# Patient Record
Sex: Female | Born: 1950 | Race: White | Hispanic: No | Marital: Married | Smoking: Former smoker
Health system: Southern US, Community
[De-identification: ages and names within clinical notes are randomized; demographics above are authoritative.]

## PROBLEM LIST (undated history)

## (undated) DIAGNOSIS — F419 Anxiety disorder, unspecified: Secondary | ICD-10-CM

## (undated) DIAGNOSIS — K219 Gastro-esophageal reflux disease without esophagitis: Secondary | ICD-10-CM

## (undated) DIAGNOSIS — T7840XA Allergy, unspecified, initial encounter: Secondary | ICD-10-CM

## (undated) DIAGNOSIS — K635 Polyp of colon: Secondary | ICD-10-CM

## (undated) DIAGNOSIS — E559 Vitamin D deficiency, unspecified: Secondary | ICD-10-CM

## (undated) DIAGNOSIS — R011 Cardiac murmur, unspecified: Secondary | ICD-10-CM

## (undated) DIAGNOSIS — Z1509 Genetic susceptibility to other malignant neoplasm: Secondary | ICD-10-CM

## (undated) DIAGNOSIS — Z1501 Genetic susceptibility to malignant neoplasm of breast: Secondary | ICD-10-CM

## (undated) DIAGNOSIS — Z1502 Genetic susceptibility to malignant neoplasm of ovary: Secondary | ICD-10-CM

## (undated) DIAGNOSIS — E785 Hyperlipidemia, unspecified: Secondary | ICD-10-CM

## (undated) DIAGNOSIS — Z1589 Genetic susceptibility to other disease: Secondary | ICD-10-CM

## (undated) DIAGNOSIS — K56699 Other intestinal obstruction unspecified as to partial versus complete obstruction: Secondary | ICD-10-CM

## (undated) DIAGNOSIS — M1990 Unspecified osteoarthritis, unspecified site: Secondary | ICD-10-CM

## (undated) HISTORY — DX: Genetic susceptibility to other malignant neoplasm: Z15.09

## (undated) HISTORY — DX: Cardiac murmur, unspecified: R01.1

## (undated) HISTORY — DX: Unspecified osteoarthritis, unspecified site: M19.90

## (undated) HISTORY — DX: Genetic susceptibility to malignant neoplasm of breast: Z15.01

## (undated) HISTORY — PX: EYE SURGERY: SHX253

## (undated) HISTORY — PX: COLONOSCOPY: SHX174

## (undated) HISTORY — DX: Genetic susceptibility to malignant neoplasm of ovary: Z15.02

## (undated) HISTORY — DX: Gastro-esophageal reflux disease without esophagitis: K21.9

## (undated) HISTORY — PX: BREAST BIOPSY: SHX20

## (undated) HISTORY — DX: Polyp of colon: K63.5

## (undated) HISTORY — DX: Other intestinal obstruction unspecified as to partial versus complete obstruction: K56.699

## (undated) HISTORY — DX: Anxiety disorder, unspecified: F41.9

## (undated) HISTORY — DX: Allergy, unspecified, initial encounter: T78.40XA

## (undated) HISTORY — PX: BREAST CYST ASPIRATION: SHX578

## (undated) HISTORY — DX: Vitamin D deficiency, unspecified: E55.9

## (undated) HISTORY — DX: Hyperlipidemia, unspecified: E78.5

## (undated) HISTORY — DX: Genetic susceptibility to other disease: Z15.89

---
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
## 1980-05-11 HISTORY — PX: TUBAL LIGATION: SHX77

## 1981-05-11 HISTORY — PX: LIPOMA EXCISION: SHX5283

## 1998-11-08 ENCOUNTER — Other Ambulatory Visit: Admission: RE | Admit: 1998-11-08 | Discharge: 1998-11-08 | Payer: Self-pay | Admitting: *Deleted

## 2000-01-08 ENCOUNTER — Other Ambulatory Visit: Admission: RE | Admit: 2000-01-08 | Discharge: 2000-01-08 | Payer: Self-pay | Admitting: Obstetrics and Gynecology

## 2000-05-11 HISTORY — PX: BREAST SURGERY: SHX581

## 2000-05-14 ENCOUNTER — Encounter (INDEPENDENT_AMBULATORY_CARE_PROVIDER_SITE_OTHER): Payer: Self-pay | Admitting: Specialist

## 2000-05-14 ENCOUNTER — Encounter: Admission: RE | Admit: 2000-05-14 | Discharge: 2000-05-14 | Payer: Self-pay | Admitting: Obstetrics and Gynecology

## 2000-05-14 ENCOUNTER — Encounter: Payer: Self-pay | Admitting: Obstetrics and Gynecology

## 2000-05-14 ENCOUNTER — Other Ambulatory Visit: Admission: RE | Admit: 2000-05-14 | Discharge: 2000-05-14 | Payer: Self-pay | Admitting: Obstetrics and Gynecology

## 2000-07-22 ENCOUNTER — Encounter: Payer: Self-pay | Admitting: Obstetrics and Gynecology

## 2000-07-22 ENCOUNTER — Encounter: Admission: RE | Admit: 2000-07-22 | Discharge: 2000-07-22 | Payer: Self-pay | Admitting: Obstetrics and Gynecology

## 2000-11-26 ENCOUNTER — Emergency Department (HOSPITAL_COMMUNITY): Admission: EM | Admit: 2000-11-26 | Discharge: 2000-11-27 | Payer: Self-pay | Admitting: Emergency Medicine

## 2000-11-27 ENCOUNTER — Encounter: Payer: Self-pay | Admitting: Emergency Medicine

## 2000-12-09 HISTORY — PX: ABDOMINAL HYSTERECTOMY: SHX81

## 2000-12-14 ENCOUNTER — Encounter: Payer: Self-pay | Admitting: Obstetrics and Gynecology

## 2000-12-21 ENCOUNTER — Encounter (INDEPENDENT_AMBULATORY_CARE_PROVIDER_SITE_OTHER): Payer: Self-pay | Admitting: Specialist

## 2000-12-21 ENCOUNTER — Inpatient Hospital Stay (HOSPITAL_COMMUNITY): Admission: RE | Admit: 2000-12-21 | Discharge: 2000-12-23 | Payer: Self-pay | Admitting: Obstetrics and Gynecology

## 2001-06-06 ENCOUNTER — Encounter: Admission: RE | Admit: 2001-06-06 | Discharge: 2001-06-06 | Payer: Self-pay | Admitting: Obstetrics and Gynecology

## 2001-06-06 ENCOUNTER — Encounter: Payer: Self-pay | Admitting: Obstetrics and Gynecology

## 2002-01-05 ENCOUNTER — Encounter (INDEPENDENT_AMBULATORY_CARE_PROVIDER_SITE_OTHER): Payer: Self-pay | Admitting: Specialist

## 2002-01-05 ENCOUNTER — Ambulatory Visit (HOSPITAL_COMMUNITY): Admission: RE | Admit: 2002-01-05 | Discharge: 2002-01-05 | Payer: Self-pay | Admitting: *Deleted

## 2002-03-08 ENCOUNTER — Other Ambulatory Visit: Admission: RE | Admit: 2002-03-08 | Discharge: 2002-03-08 | Payer: Self-pay | Admitting: Obstetrics and Gynecology

## 2002-06-26 ENCOUNTER — Encounter: Payer: Self-pay | Admitting: Obstetrics and Gynecology

## 2002-06-26 ENCOUNTER — Encounter: Admission: RE | Admit: 2002-06-26 | Discharge: 2002-06-26 | Payer: Self-pay | Admitting: Obstetrics and Gynecology

## 2002-07-10 ENCOUNTER — Ambulatory Visit (HOSPITAL_COMMUNITY): Admission: RE | Admit: 2002-07-10 | Discharge: 2002-07-10 | Payer: Self-pay | Admitting: Obstetrics and Gynecology

## 2002-11-27 ENCOUNTER — Encounter: Payer: Self-pay | Admitting: Diagnostic Radiology

## 2002-11-27 ENCOUNTER — Encounter: Admission: RE | Admit: 2002-11-27 | Discharge: 2002-11-27 | Payer: Self-pay

## 2002-12-19 ENCOUNTER — Encounter: Admission: RE | Admit: 2002-12-19 | Discharge: 2002-12-19 | Payer: Self-pay

## 2003-02-05 ENCOUNTER — Encounter: Admission: RE | Admit: 2003-02-05 | Discharge: 2003-02-05 | Payer: Self-pay

## 2003-05-28 ENCOUNTER — Other Ambulatory Visit: Admission: RE | Admit: 2003-05-28 | Discharge: 2003-05-28 | Payer: Self-pay | Admitting: Obstetrics and Gynecology

## 2003-07-02 ENCOUNTER — Encounter: Admission: RE | Admit: 2003-07-02 | Discharge: 2003-07-02 | Payer: Self-pay | Admitting: Obstetrics and Gynecology

## 2004-07-07 ENCOUNTER — Encounter: Admission: RE | Admit: 2004-07-07 | Discharge: 2004-07-07 | Payer: Self-pay | Admitting: Obstetrics and Gynecology

## 2005-07-13 ENCOUNTER — Encounter: Admission: RE | Admit: 2005-07-13 | Discharge: 2005-07-13 | Payer: Self-pay | Admitting: Obstetrics and Gynecology

## 2005-07-20 ENCOUNTER — Encounter: Admission: RE | Admit: 2005-07-20 | Discharge: 2005-07-20 | Payer: Self-pay | Admitting: Internal Medicine

## 2006-06-15 ENCOUNTER — Encounter: Admission: RE | Admit: 2006-06-15 | Discharge: 2006-09-13 | Payer: Self-pay | Admitting: Certified Nurse Midwife

## 2006-07-15 ENCOUNTER — Encounter: Admission: RE | Admit: 2006-07-15 | Discharge: 2006-07-15 | Payer: Self-pay | Admitting: Obstetrics and Gynecology

## 2007-08-11 ENCOUNTER — Encounter: Admission: RE | Admit: 2007-08-11 | Discharge: 2007-08-11 | Payer: Self-pay | Admitting: Obstetrics and Gynecology

## 2008-06-28 ENCOUNTER — Ambulatory Visit: Payer: Self-pay | Admitting: Internal Medicine

## 2008-07-04 ENCOUNTER — Telehealth: Payer: Self-pay | Admitting: Internal Medicine

## 2008-07-19 ENCOUNTER — Encounter: Payer: Self-pay | Admitting: Internal Medicine

## 2008-07-19 ENCOUNTER — Ambulatory Visit: Payer: Self-pay | Admitting: Internal Medicine

## 2008-07-21 ENCOUNTER — Encounter: Payer: Self-pay | Admitting: Internal Medicine

## 2008-08-14 ENCOUNTER — Encounter: Admission: RE | Admit: 2008-08-14 | Discharge: 2008-08-14 | Payer: Self-pay | Admitting: Obstetrics and Gynecology

## 2008-08-16 ENCOUNTER — Encounter: Admission: RE | Admit: 2008-08-16 | Discharge: 2008-08-16 | Payer: Self-pay | Admitting: Obstetrics and Gynecology

## 2009-07-17 ENCOUNTER — Ambulatory Visit: Payer: Self-pay | Admitting: Internal Medicine

## 2009-08-06 LAB — HM COLONOSCOPY: HM Colonoscopy: NORMAL

## 2009-08-27 ENCOUNTER — Encounter: Admission: RE | Admit: 2009-08-27 | Discharge: 2009-08-27 | Payer: Self-pay | Admitting: Obstetrics and Gynecology

## 2010-03-24 ENCOUNTER — Encounter: Admission: RE | Admit: 2010-03-24 | Discharge: 2010-03-24 | Payer: Self-pay | Admitting: Obstetrics and Gynecology

## 2010-06-01 ENCOUNTER — Encounter: Payer: Self-pay | Admitting: Obstetrics and Gynecology

## 2010-06-23 ENCOUNTER — Other Ambulatory Visit: Payer: Self-pay | Admitting: Obstetrics and Gynecology

## 2010-06-23 DIAGNOSIS — Z1231 Encounter for screening mammogram for malignant neoplasm of breast: Secondary | ICD-10-CM

## 2010-08-29 ENCOUNTER — Ambulatory Visit
Admission: RE | Admit: 2010-08-29 | Discharge: 2010-08-29 | Disposition: A | Discharge status: Home / Self Care | Payer: Federal, State, Local not specified - PPO | Source: Ambulatory Visit | Attending: Obstetrics and Gynecology | Admitting: Obstetrics and Gynecology

## 2010-08-29 DIAGNOSIS — Z1231 Encounter for screening mammogram for malignant neoplasm of breast: Secondary | ICD-10-CM

## 2010-09-26 NOTE — H&P (Signed)
[facility]  Patient:    Rachael Sharp, Rachael Sharp            MRN: [id number] Adm. Date:  [id number] Attending:  Jenean Lindau                         History and Physical  IDENTIFYING DATA:  The patient is a 60 year old female with a large adnexal neoplasm admitted for TAH/BSO and staging as indicated.  HISTORY OF PRESENT ILLNESS:  The patient is a 60 year old gravida 0 female who initially presented for routine gynecologic care in August of 2001, with a normal examination.  She subsequently has some right lower quadrant pain and GI complaints and was seen in the emergency room and had evaluation including ultrasound and CT scan which showed a large adnexal neoplasm.  CT of the pelvis revealed a 12 x 7 cm posterior right ovarian mass.  Pelvic ultrasound confirmed a 10.7 x 10.5 x 8.1 cm mass on the posterior aspect of the right ovary which was complex in appearance.  She subsequently has had gradual resolution of her pain which was felt possibly due to early torsion versus a GI virus which subsequently resolved spontaneously.  She had a baseline CA-125 which was normal at 11.9.  Due to the large size of the mass, and the risk for malignancy she is to undergo and TAH/BSO and staging as indicated.  Dr. Ronita Hipps from the Department of GYN oncology at [facility] is on standby in the event that staging is indicated.  She is subsequently counselled as to the risks, benefits, alternatives and complications of the procedure and agrees to proceed.  She is agreeable to complete hysterectomy, even if the neoplasm is benign due to the risk of bilaterality.  Full consent has been given.  She is admitted now for same day surgery.  PAST HISTORY AND MEDICAL: 1. Gastroesophageal reflux disease. 2. Irritable bowel. 3. History of depression. 4. Mitral valve prolapse requiring prophylaxis, asymptomatic.  PAST SURGICAL HISTORY: 1. Tonsillectomy and adenoidectomy as a  child. 2. Vaginal tubal ligation in the distant past. 3. Stereotactic core biopsy of the right breast, January 2002, with sclerosis    adenosis.  ALLERGIES:  PENICILLIN causes hives.  CODEINE causes nausea and vomiting.  No other allergies or sensitivities including latex.  TRANSFUSION HISTORY:  Negative.  CURRENT MEDICATIONS: 1. Prilosec 20 mg q. day. 2. Vioxx 25 mg q. day. 3. Stopped birth control pills in January of 2002. 4. Prior Paxil use which was discontinued. 5. Prior celexa which was discontinued as well.  SOCIAL HISTORY:  The patient is married with no children.  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 She denies any smoking, alcohol or illicit drug use.  FAMILY HISTORY:  Positive for hypertension and heart disease in her father, otherwise no maternal history of ovarian, uterine, breast or colon carcinoma.  REVIEW OF SYSTEMS:  Notable for the history of present illness.  She does not some abdominal pressure and some dull achy pain in the right lower quadrant which is well controlled by the vioxx.  She has no other acute pain and had resolution of her GI symptoms.  She denies any fever or constitutional symptoms.  Cardiovascular and respiratory:  Negative with no symptoms related to palpitations.  Neurologic:  Nonfocal.  She describes herself as being very anxious.  PHYSICAL EXAMINATION:  Physical examination prior to admission:  GENERAL:  Revealed a petite white female in no  apparent distress.  Height 5 foot 5 inches.  Weight 130 pounds.  VITAL SIGNS:  Blood pressure 104/70, respirations 16, pulse 76, temperature 98.  HEENT:  Grossly negative.  Oropharynx clear.  NECK:  Supple without thyromegaly or lymphadenopathy.  CHEST:  Without spine or CVA tenderness.  LUNGS:  Clear to auscultation.  HEART:  regular rate and rhythm with a grade 2/6 systolic ejection murmur at the base.  No obvious click.  Carotids +2 and equal without bruit.   Distal pulses full.  BREAST:  Without masses.  ABDOMEN:  Without hepatosplenomegaly or tenderness.  A suprapubic mass was appreciated.  PELVIC:  Revealed a clean vagina and cervix.  Uterus mid plane to retroverted, displaced due to the mass with a large posterior multicystic mass involving the posterior pelvis which was poorly mobile.  RECTOVAGINAL:  Confirmatory.  EXTREMITIES:  Without edema.  NEUROLOGIC:  Grossly nonfocal.  LABORATORY DATA:  Laboratory studies on admission reveal a hemoglobin of 12.9, hematocrit 38.3, normal white count and platelets.  Comprehensive metabolic panel with a nonfasting glucose of 133, alk. phos. slightly low at 38, otherwise normal.  Urinalysis clear.  Coagulation studies within normal limits.  Chest x-ray without active disease.  EKG normal sinus rhythm.  IMPRESSION ON ADMISSION: 1. 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  Benign versus malignant. 2. History of depression/anxiety. 3. Mitral valve prolapse, asymptomatic, requiring prophylaxis. 4. Recent breast biopsy, benign. 5. History of gastroesophageal reflux disease.  PLAN:  The patient is admitted for same day surgery. She will undergo a TAH/BSO and staging as indicated.  Dr. Sudie Grumbling T. Kyla Balzarine, is on standby in the event that staging is necessary.  She has given full consent for any and all necessary procedures.  She understands she will be permanently and irreversibly sterilized and become immediately menopausal as a result.  She was given a Vivelle-Dot 0.075 mg which she placed on her buttocks this morning prior to coming to the hospital and her hormones will be titrated postoperatively as needed.  She has undergone a mechanical bowel prep and has received Ancef and gentamicin preoperatively and presents now for surgery. DD:  12/21/00 TD:  12/21/00 Job: [id number]

## 2010-09-26 NOTE — Op Note (Signed)
   TNAMEGHINA, BITTINGER              ACCOUNT NO.:  [id number]   MEDICAL RECORD NO.:  [id number]                   PATIENT TYPE:  AMB   LOCATION:  ENDO                                 FACILITY:  [facility]   PHYSICIAN:  Georgiana Spinner, M.D.                 DATE OF BIRTH:  11/02/1950   DATE OF PROCEDURE:  DATE OF DISCHARGE:                                 OPERATIVE REPORT   PROCEDURE:  Upper endoscopy.   INDICATIONS FOR PROCEDURE:  Gastroesophageal reflux disease.   ANESTHESIA:  Demerol 70, Versed 7 mg.   DESCRIPTION OF PROCEDURE:  With the patient mildly sedated in the left  lateral decubitus position, the Olympus videoscopic endoscope was inserted  in the mouth, passed under direct vision through the esophagus which  appeared normal. There was no evidence of Barrett's seen. We entered into  the stomach. The fundus, body, antrum, duodenal bulb and second portion of  the duodenum were visualized. From this point, the endoscope was slowly  withdrawn taking circumferential views of the entire duodenal mucosa until  the endoscope was pulled back into the stomach and placed in retroflexion to  view the stomach from below. The endoscope was then straightened and  withdrawn taking circumferential views of the remaining gastric and  esophageal mucosa stopping in the body and fundus of the stomach where there  were some changes of gastritis, photographed and biopsied. The patient's  vital signs and pulse oximeter remained stable. The patient tolerated the  procedure well without apparent complications.   FINDINGS:  Changes of gastritis biopsied, await biopsy report. The patient  will call me for results and followup with me as an outpatient. Proceed to  colonoscopy as planned.                                               Georgiana Spinner, M.D.    GMO/MEDQ  D:  01/05/2002  T:  01/06/2002  Job:  [id number]   cc:   Darius Bump, M.D.   Laqueta Linden, M.D.  [address],  [address]  [place]  [place] [address]  Fax: [phone]   [facility]. Danella Deis, M.D.

## 2010-09-26 NOTE — Op Note (Signed)
   Rachael Sharp, Rachael Sharp              ACCOUNT NO.:  [id number]   MEDICAL RECORD NO.:  [id number]                   PATIENT TYPE:  AMB   LOCATION:  ENDO                                 FACILITY:  [facility]   PHYSICIAN:  Georgiana Spinner, M.D.                 DATE OF BIRTH:  05/02/1951   DATE OF PROCEDURE:  DATE OF DISCHARGE:                                 OPERATIVE REPORT   PROCEDURE:  Colonoscopy.   INDICATIONS FOR PROCEDURE:  Colon cancer screening.   ANESTHESIA:  Demerol 30, Versed 3 mg additionally.   DESCRIPTION OF PROCEDURE:  With the patient mildly sedated in the left  lateral decubitus position, a rectal exam was performed which revealed trace  positive material.  Subsequently, the Olympus videoscopic colonoscope was  inserted in the rectum and passed under direct vision to the cecum  identified by the ileocecal valve and appendiceal orifice. The prep was  good. Visicol tablets were used.  From this point, the colonoscope was  slowly withdrawn taking circumferential views of the entire colonic mucosa  stopping only in the rectum which appeared normal on direct and showed  hemorrhoids on retroflexed view. The endoscope was straightened and  withdrawn. The patient's vital signs and pulse oximeter remained stable. The  patient tolerated the procedure well without apparent complications.   FINDINGS:  Internal hemorrhoids, otherwise, unremarkable examination.   PLAN:  See endoscopy note for further details.                                                 Georgiana Spinner, M.D.    GMO/MEDQ  D:  01/05/2002  T:  01/06/2002  Job:  [id number]   cc:   Darius Bump, M.D.   Laqueta Linden, M.D.  [address]  [place]  [place] [address]  Fax: [phone]   [facility]. Danella Deis, M.D.

## 2010-09-26 NOTE — Op Note (Signed)
[facility]  Patient:    Rachael Sharp, Rachael Sharp            MRN: [id number] Proc. Date: 12/21/00 Adm. Date:  [id number] Attending:  Jenean Lindau                           Operative Report  PREOPERATIVE DIAGNOSIS:  Adnexal neoplasm, normal CA-125.  POSTOPERATIVE DIAGNOSIS:  Benign adnexal neoplasm, right, paraovarian cyst versus hydrosalpinx per Dr. Guilford Shi.  PROCEDURE:  Total abdominal hysterectomy with bilateral salpingo-oophorectomy.  SURGEON:  Laqueta Linden, M.D.  ASSISTANT:  Edwena Felty. Ashley Royalty, M.D. standby John T. Kyla Balzarine, M.D.  ANESTHESIA:  General endotracheal.  ESTIMATED BLOOD LOSS:  100 cc.  URINE OUTPUT:  200 cc.  FLUIDS:  1400 cc of crystalloid.  COUNTS:  Correct x 2.  COMPLICATIONS:  None.  INDICATIONS:  Rachael Sharp is a 60 year old, nulligravida, perimenopausal female, who presented with a large cystic mass involving the right adnexa, measuring 10 x 10 x 7 cm.  She had a normal CA-125.  She is to undergo TAH/BSO with staging procedures as indicated.  She has been fully counseled as to the risks; benefits; alternatives, and complications of the procedure, including but not limited to, anesthesia risk; infection; bleeding requiring transfusion; injury to bowel, bladder, ureters, vessels, nerves; possible need for staging lymphadenectomy; omentectomy, etc.; postoperative menopausal symptoms with discussion of alternatives and estrogen replacement therapy and other alternatives as well as postoperative recovery expectations, regarding time in the hospital, recovery time after hospitalization, sexual functioning, and return to normal activity.  She has been extensively counseled as has her husband.  They have seen the informed consent film and given full consent, and she agrees now to proceed.  She elects to proceed with complete hysterectomy even if the cyst is of a benign nature.  Dr. Ronita Hipps of the department of  GYN/Oncology at [facility] is on surgical standby in the event that staging is indicated based on the frozen section.  DESCRIPTION OF PROCEDURE:  The patient was taken to the operating room and after proper identification and consents were ascertained, she was placed on the operating table in the supine position.  After the induction of general endotracheal anesthesia, she was placed in the Fairview stirrups, and the abdomen, perineum, and vagina were prepped and draped in a routine sterile fashion.  A transurethral Foley was placed.  A vertical incision was made just from just above the symphysis to just below the umbilicus and was carried down to the level of the anterior rectus fascia.  The fascia was incised, and the incision was extended superiorly and inferiorly.  The rectus muscles were then separated.  The parietal peritoneum was elevated and incised, and the incision was extended superiorly and inferiorly to the level of the bladder. Peritoneal washings were taken and placed in heparinized solution and sent to cytology.  Inspection of the upper abdomen revealed smooth peritoneal surfaces.  The omentum was free of lesions.  The renal contours appeared normal.  The liver edge and diaphragm were smooth and free of nodularity.  The gallbladder had no obvious stones.  The appendix was somewhat adherent to the posterior cecum but was of normal caliber but with no obvious sludge or appendicolith identified.  The self-retaining retractor was placed and the bowel packed in the upper abdomen using moistened lap packs.  The pelvis was inspected, and the uterus was noted to be of normal size and freely mobile.  The right tube was status post tubal ligation and appeared to have a distal hydrosalpinx.  The right ovary actually appeared small and menopausal in appearance.  Attached to the tube and ovary by a cystic-appearing stalk, was a large, mobile, cystic mass.  There were multiple filmy adhesions to  the cul-de-sac and the small bowel which were easily lysed.  It was unclear whether this was paraovarian cyst or a huge hydrosalpinx, although it had no obvious malignant characteristics.  This was elevated into the operative field. The round ligament on the right was suture ligated and opened with dissection carried forward and posteriorly in the broad ligament.  The infundibulopelvic ligament was isolated on the right and the pedicle clamped, cut, and suture ligated after locating the ureter well out of the operative field.  A proximal adnexal clamp was then placed at the uterine cornua, and the right tube and ovary with the mass were then removed and sent for frozen section.  This was evaluated by Dr. Ralph Leyden, who felt that this was a benign neoplasm, either paraovarian or hydrosalpinx.  He did not see any reason to perform frozen section, as this appeared completely benign and was a single cystic mass upon opening.  The patients husband was informed via telephone of the benign nature of the cyst, and Dr. Kyla Balzarine was notified that his standby services would not be required.  Attention was then turned to the left adnexa after elevation of the uterus out of the operative field.  It also was noted to be somewhat convoluted with a small hydrosalpinx with a normal-appearing ovary.  There were also filmy adhesions to the small bowel as well which were sharply lysed with easy mobilization of that adnexa.  The round ligament on the left was clamped, suture ligated, and cut with dissection carried forward and posteriorly in the broad ligament with isolation of that infundibulopelvic ligament.  The ligament was clamped, cut, and doubly ligated with 0 Vicryl after isolating the ureter distal out of the operative field.  The uterine vessels were then skeletonized bilaterally and curved Heaney clamps placed across the vessels at the level of the internal os.  Pedicles were cut and suture ligated  with 0 Vicryl.  Successive straight Heaney clamps were placed along the cardinal ligaments bilaterally with pedicles cut and suture ligated with 0 Vicryl.  The bladder was advanced off  of the lower uterine segment without difficulty.  Curved Heaney clamps were placed across the uterosacral ligaments and upper vaginal angles bilaterally with pedicles cut and Heaney-ligated, and tagged.  The cervix was then circumscribed with a Satinsky, and the uterus, left tube, and ovary were then removed and sent for final sectioning to pathology.  The angles were then closed using Richardson angled sutures of 0 Vicryl.  The remainder of the vaginal cuff was closed with interrupted figure-of-eight sutures of 0 Vicryl. Several small bleeding points on the cuff were then cauterized.  Copious lavage was accomplished.  All peritoneal surfaces and pedicles were hemostatic.  The uterosacral ligaments were then plicated posteriorly to prevent enterocele formation.  The lap packs and retractor was then removed. Needle, sponge, and instrument counts were correct prior to closure of the abdomen.  The incision was closed in a running Smead-Jones fashion from top to middle and bottom to middle with 0 Maxon.  The skin was closed with staples, and a pressure dressing was then applied.  The patient was extubated and stable and transferred to the recovery room.  Estimated  blood loss 100 cc. Urine output: 200 cc.  Fluids: 1400 cc of crystalloid.  Counts correct x 2 and complications none. DD:  12/21/00 TD:  12/21/00 Job: [id number]

## 2010-09-26 NOTE — Op Note (Signed)
NAME:  Rachael Sharp, Rachael Sharp NO.:  [id number]   MEDICAL RECORD NO.:  [id number]                   PATIENT TYPE:  AMB   LOCATION:  SDC                                  FACILITY:  [facility]   PHYSICIAN:  Malva Limes, M.D.                 DATE OF BIRTH:  [birth year]   DATE OF PROCEDURE:  07/10/2002  DATE OF DISCHARGE:                                 OPERATIVE REPORT   PREOPERATIVE DIAGNOSES:  Chronic left lower quadrant pain.   POSTOPERATIVE DIAGNOSES:  1. Chronic left lower quadrant pain.  2. Large and small bowel adhesions.   PROCEDURE:  Diagnostic laparoscopy with lysis of adhesions.   SURGEON:  Malva Limes, M.D.   ANESTHESIA:  General endotracheal.   ANTIBIOTICS:  Vancomycin.   ESTIMATED BLOOD LOSS:  Minimal.   SPECIMENS:  None.   COMPLICATIONS:  None.   FINDINGS:  The patient had an adhesion between a loop of small bowel and the  bladder.  She also had several small adhesions involving the cecum,  appendix, and right lower quadrant.  The sigmoid colon had a few adhesions  to the left pelvic side wall.  The ovaries and uterus were surgically  absent.  The gallbladder appeared to be normal.  Liver appeared to be  normal.  There were no other adhesions or evidence of endometriosis.   PROCEDURE:  The patient was taken to the operating room where a general  anesthetic was administered without complications.  She was then placed in  the dorsal lithotomy position and prepped with Hibiclens.  Her bladder was  drained with a red rubber catheter.  She was then draped in the usual  fashion for this procedure.  A vertical skin incision was made.  This was  carried down to the fascia.  The fascia was incised with the Mayo.  The  anterior leaf of the fascia was incised with the Mayo.  The rectus muscles  were dissected.  The posterior leaf was grasped and transected.  The  parietoperitoneum was entered bluntly.  0 Vicryl sutures were placed  laterally.  The  Hasson cannula was placed into the abdominal cavity and  attached to the suture.  The abdominal cavity was insufflated with 3 L of  carbon dioxide.  The scope was then placed and the pelvic and abdominal  anatomy examined with findings as noted above.  At this point a 5 mm port  was placed in the left lower quadrant under direct visualization.  The  scissors were used to transect the loop of small bowel from the bladder.  Once this was accomplished, careful examination of the serosa and bowel were  undertaken.  There was no evidence of any injury except for a few abrasions  of the serosal surface.  At this point the adhesions involving the sigmoid  colon were taken down sharply.  Following this the adhesions involving the  sigmoid colon were transected.  The appendix was easily visualized, however,  was somewhat adherent to the right pelvic side wall and this was not  dissected.  At this point the abdominopelvic contents were copiously  irrigated.  Hemostasis appeared to be adequate.  This concluded the  procedure.  The  instruments were removed, the pneumoperitoneum released.  The fascia was  closed with interrupted 0 Vicryl suture.  The skin was closed with  interrupted 4-0 Vicryl suture.  The patient was extubated and taken to  recovery room in stable condition.  Instrument, lap counts correct x2.                                               Malva Limes, M.D.    MA/MEDQ  D:  07/10/2002  T:  07/10/2002  Job:  [id number]

## 2010-10-05 ENCOUNTER — Emergency Department: Payer: Self-pay | Admitting: Emergency Medicine

## 2011-06-23 ENCOUNTER — Encounter: Payer: Self-pay | Admitting: Internal Medicine

## 2011-06-23 ENCOUNTER — Ambulatory Visit (INDEPENDENT_AMBULATORY_CARE_PROVIDER_SITE_OTHER): Payer: Federal, State, Local not specified - PPO | Admitting: Internal Medicine

## 2011-06-23 VITALS — BP 112/80 | HR 80 | Temp 98.5°F | Wt 151.0 lb

## 2011-06-23 DIAGNOSIS — N904 Leukoplakia of vulva: Secondary | ICD-10-CM

## 2011-06-23 DIAGNOSIS — N63 Unspecified lump in unspecified breast: Secondary | ICD-10-CM

## 2011-06-23 DIAGNOSIS — N6310 Unspecified lump in the right breast, unspecified quadrant: Secondary | ICD-10-CM

## 2011-06-23 NOTE — Progress Notes (Signed)
  Subjective:    Patient ID: Rachael Sharp, female    DOB: December 02, 1950, 61 y.o.   MRN: [id number]  HPI is a 61 year old white female  who presents with a nontender breast mass on the right with no recent history of trauma or mastitis. She has had some sinus congestion without fevers recently and wonders if she has an enlarged lymph node. She has had prior mammography which showed a cyst on the right.    Past Medical History  Diagnosis Date  . Heart murmur     mitral valve prolapse  . GERD (gastroesophageal reflux disease)    No current outpatient prescriptions on file prior to visit.     Review of Systems  Constitutional: Negative for fever, chills and unexpected weight change.  HENT: Negative for hearing loss, ear pain, nosebleeds, congestion, sore throat, facial swelling, rhinorrhea, sneezing, mouth sores, trouble swallowing, neck pain, neck stiffness, voice change, postnasal drip, sinus pressure, tinnitus and ear discharge.   Eyes: Negative for pain, discharge, redness and visual disturbance.  Respiratory: Negative for cough, chest tightness, shortness of breath, wheezing and stridor.   Cardiovascular: Negative for chest pain, palpitations and leg swelling.  Musculoskeletal: Negative for myalgias and arthralgias.  Skin: Negative for color change and rash.  Neurological: Negative for dizziness, weakness, light-headedness and headaches.  Hematological: Negative for adenopathy.   BP 112/80  Pulse 80  Temp(Src) 98.5 F (36.9 C) (Oral)  Wt 151 lb (68.493 kg)  SpO2 99%       Objective:   Physical Exam  Constitutional: She is oriented to person, place, and time. She appears well-developed and well-nourished.  HENT:  Mouth/Throat: Oropharynx is clear and moist.  Eyes: EOM are normal. Pupils are equal, round, and reactive to light. No scleral icterus.  Neck: Normal range of motion. Neck supple. No JVD present. No thyromegaly present.  Cardiovascular: Normal rate, regular  rhythm, normal heart sounds and intact distal pulses.   Pulmonary/Chest: Effort normal and breath sounds normal.    Abdominal: Soft. Bowel sounds are normal. She exhibits no mass. There is no tenderness.  Musculoskeletal: Normal range of motion. She exhibits no edema.  Lymphadenopathy:    She has no cervical adenopathy.  Neurological: She is alert and oriented to person, place, and time.  Skin: Skin is warm and dry.  Psychiatric: She has a normal mood and affect.       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  History of breast cyst noted.     Updated Medication List Outpatient Encounter Prescriptions as of 06/23/2011  Medication Sig Dispense Refill  . amitriptyline (ELAVIL) 10 MG tablet Take 3 by mouth daily at bedtime      . estradiol (VIVELLE-DOT) 0.05 MG/24HR Apply one patch two times a week.

## 2011-06-25 ENCOUNTER — Encounter: Payer: Self-pay | Admitting: Internal Medicine

## 2011-06-25 DIAGNOSIS — K219 Gastro-esophageal reflux disease without esophagitis: Secondary | ICD-10-CM | POA: Insufficient documentation

## 2011-06-25 DIAGNOSIS — N904 Leukoplakia of vulva: Secondary | ICD-10-CM | POA: Insufficient documentation

## 2011-06-25 DIAGNOSIS — R011 Cardiac murmur, unspecified: Secondary | ICD-10-CM | POA: Insufficient documentation

## 2011-06-25 DIAGNOSIS — N6310 Unspecified lump in the right breast, unspecified quadrant: Secondary | ICD-10-CM | POA: Insufficient documentation

## 2011-06-25 NOTE — Assessment & Plan Note (Signed)
Diagnostic mammogram and ultrasound ordered .  History of breast cyst noted.

## 2011-06-25 NOTE — Assessment & Plan Note (Signed)
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

## 2011-07-07 ENCOUNTER — Telehealth: Payer: Self-pay | Admitting: Internal Medicine

## 2011-07-07 DIAGNOSIS — M25552 Pain in left hip: Secondary | ICD-10-CM

## 2011-07-07 NOTE — Telephone Encounter (Signed)
The only issue I recall [place] talking about was her breast .  I will need more information for th referral (which hip,  How long has it been hurting) And has she had her breast imaging yet?

## 2011-07-07 NOTE — Telephone Encounter (Signed)
[phone] Pt would like to get a referral to ortho for hip pain she spoke about this with you on her last visit   She would like to go to [facility]

## 2011-07-08 NOTE — Telephone Encounter (Signed)
I recommend Dr. Darrelyn Hillock, [facility] Fast.  Referral in process

## 2011-07-08 NOTE — Telephone Encounter (Signed)
Patient notified

## 2011-07-08 NOTE — Telephone Encounter (Signed)
Patient stated it is her left hip, and it has been very uncomfortable since early December.  She stated she has been to the chiropractor but it has not helped, just keeps getting worse.  She wants to go to [place] but does not have a preference from there.  She is having her breast imaging done tomorrow.

## 2011-07-09 ENCOUNTER — Ambulatory Visit
Admission: RE | Admit: 2011-07-09 | Discharge: 2011-07-09 | Disposition: A | Discharge status: Home / Self Care | Payer: Federal, State, Local not specified - PPO | Source: Ambulatory Visit | Attending: Internal Medicine | Admitting: Internal Medicine

## 2011-07-09 DIAGNOSIS — N63 Unspecified lump in unspecified breast: Secondary | ICD-10-CM

## 2011-08-04 ENCOUNTER — Other Ambulatory Visit: Payer: Self-pay | Admitting: Obstetrics and Gynecology

## 2011-08-04 DIAGNOSIS — Z1231 Encounter for screening mammogram for malignant neoplasm of breast: Secondary | ICD-10-CM

## 2011-09-01 ENCOUNTER — Ambulatory Visit (INDEPENDENT_AMBULATORY_CARE_PROVIDER_SITE_OTHER): Payer: Federal, State, Local not specified - PPO | Admitting: Internal Medicine

## 2011-09-01 ENCOUNTER — Encounter: Payer: Self-pay | Admitting: Internal Medicine

## 2011-09-01 VITALS — BP 112/78 | HR 82 | Temp 98.7°F | Resp 16 | Ht 65.0 in | Wt 158.5 lb

## 2011-09-01 DIAGNOSIS — H6120 Impacted cerumen, unspecified ear: Secondary | ICD-10-CM | POA: Insufficient documentation

## 2011-09-01 DIAGNOSIS — J069 Acute upper respiratory infection, unspecified: Secondary | ICD-10-CM | POA: Insufficient documentation

## 2011-09-01 MED ORDER — DOCUSATE SODIUM 50 MG/5ML PO LIQD: ORAL | Status: DC

## 2011-09-01 NOTE — Progress Notes (Signed)
Patient ID: Rachael Sharp, female   DOB: 06/08/1950, 61 y.o.   MRN: [id number]    Patient Active Problem List  Diagnoses  . Heart murmur  . GERD (gastroesophageal reflux disease)  . Dystrophy, vulva  . Mass of breast, right  . Cerumen impaction  . Upper respiratory infection    Subjective:  CC:   Chief Complaint  Patient presents with  . Sinusitis  . Otalgia    right    HPI:   Rachael Sharp a 61 y.o. female who presents New right sided occipital pain aggravated by tilting head back,  intermittent , followed by intermittent right ear fullness and right sided congestion.  No rhinnorrhea or allergic conjunctivitis sympotms.   Some post nasal  drip.  No sick contacts .  No fevers , cough or purulent discharge.  Both TMs are occluded by ear wax,  No lymphadenopathy on exam.   Past Medical History  Diagnosis Date  . Heart murmur     mitral valve prolapse  . GERD (gastroesophageal reflux disease)     Past Surgical History  Procedure Date  . Abdominal hysterectomy   . Breast surgery 2002    biopsy x 2 , 1976  . Lipoma excision 1983         The following portions of the patient's history were reviewed and updated as appropriate: Allergies, current medications, and problem list.    Review of Systems:   12 Pt  review of systems was negative except those addressed in the HPI,     History   Social History  . Marital Status: Married    Spouse Name: N/A    Number of Children: N/A  . Years of Education: N/A   Occupational History  . Not on file.   Social History Main Topics  . Smoking status: Former Smoker    Types: Cigarettes    Quit date: 08/31/1989  . Smokeless tobacco: Never Used   Comment: Quit 1991  . Alcohol Use: Yes  . Drug Use: No  . Sexually Active: Not on file   Other Topics Concern  . Not on file   Social History Narrative  . No narrative on file    Objective:  BP 112/78  Pulse 82  Temp(Src) 98.7 F (37.1 C) (Oral)   Resp 16  Ht 5\' 5"  (1.651 m)  Wt 158 lb 8 oz (71.895 kg)  BMI 26.38 kg/m2  SpO2 99%  General appearance: alert, cooperative and appears stated age Ears: normal TM's and external ear canals both ears Throat: lips, mucosa, and tongue normal; teeth and gums normal Neck: no adenopathy, no carotid bruit, supple, symmetrical, trachea midline and thyroid not enlarged, symmetric, no tenderness/mass/nodules Back: symmetric, no curvature. ROM normal. No CVA tenderness. Lungs: clear to auscultation bilaterally Heart: regular rate and rhythm, S1, S2 normal, no murmur, click, rub or gallop Abdomen: soft, non-tender; bowel sounds normal; no masses,  no organomegaly Pulses: 2+ and symmetric Skin: Skin color, texture, turgor normal. No rashes or lesions Lymph nodes: Cervical, supraclavicular, and axillary nodes normal.  Assessment and Plan:  Upper respiratory infection Her exam is benign and likely a mild viral syndrome.  supportive care recommended.  Cerumen impaction Colace liquid drops prescribed,  Return for irrigation on Thursday    Updated Medication List Outpatient Encounter Prescriptions as of 09/01/2011  Medication Sig Dispense Refill  . amitriptyline (ELAVIL) 10 MG tablet Take 30 mg by mouth at bedtime.       Marland Kitchen estradiol (VIVELLE-DOT)  0.05 MG/24HR Apply one patch two times a week.      . docusate (COLACE) 50 MG/5ML liquid 2 or 3 drops in each ear daily to soften ear wax  10 mL  0     No orders of the defined types were placed in this encounter.    No Follow-up on file.

## 2011-09-01 NOTE — Assessment & Plan Note (Signed)
Her exam is benign and likely a mild viral syndrome.  supportive care recommended.

## 2011-09-01 NOTE — Assessment & Plan Note (Signed)
Colace liquid drops prescribed,  Return for irrigation on Thursday

## 2011-09-01 NOTE — Patient Instructions (Signed)
You have a viral  Syndrome .  The post nasal drip is causing your sore throat.  Lavage your sinuses twice daly with Simply saline nasal spray.  Use benadryl 25 mg every 8 hours to  manage the drainage and congestion.  Gargle with salt water often for the sore throat.  Ok to use Alleve and tylenol for the neck pain   IIf symptoms are no better  In 3 to 4 days OR  if you develop T > 100.4,  Green nasal discharge,  Or facial pain,  Call us back and I will call in an antibiotic.    I recommend using liquid colace daily to soften your ear wax. Return on Thursday afternoon for an irrigation.

## 2011-09-03 ENCOUNTER — Ambulatory Visit: Payer: Federal, State, Local not specified - PPO

## 2011-09-08 ENCOUNTER — Ambulatory Visit
Admission: RE | Admit: 2011-09-08 | Discharge: 2011-09-08 | Disposition: A | Discharge status: Home / Self Care | Payer: Federal, State, Local not specified - PPO | Source: Ambulatory Visit | Attending: Obstetrics and Gynecology | Admitting: Obstetrics and Gynecology

## 2011-09-08 DIAGNOSIS — Z1231 Encounter for screening mammogram for malignant neoplasm of breast: Secondary | ICD-10-CM

## 2011-09-09 ENCOUNTER — Encounter: Payer: Self-pay | Admitting: Internal Medicine

## 2012-02-12 ENCOUNTER — Telehealth: Payer: Self-pay | Admitting: Internal Medicine

## 2012-02-12 NOTE — Telephone Encounter (Signed)
If the schedulers can find a 30 minute slot , but if she is just worried about getting her mammogram ordered on time I can do that without seeing her.

## 2012-02-12 NOTE — Telephone Encounter (Signed)
Pt is way over due for CPE and is wondering if she could come in sooner than February to have that done. She does not need Pap.

## 2012-02-18 NOTE — Telephone Encounter (Signed)
Spoke with pt and set her up for a CPE before end of year.

## 2012-03-08 ENCOUNTER — Ambulatory Visit (INDEPENDENT_AMBULATORY_CARE_PROVIDER_SITE_OTHER): Payer: Federal, State, Local not specified - PPO | Admitting: Internal Medicine

## 2012-03-08 ENCOUNTER — Encounter: Payer: Self-pay | Admitting: Internal Medicine

## 2012-03-08 VITALS — BP 118/72 | HR 73 | Temp 97.9°F | Ht 65.5 in | Wt 154.5 lb

## 2012-03-08 DIAGNOSIS — R5383 Other fatigue: Secondary | ICD-10-CM

## 2012-03-08 DIAGNOSIS — Z0000 Encounter for general adult medical examination without abnormal findings: Secondary | ICD-10-CM

## 2012-03-08 DIAGNOSIS — M130 Polyarthritis, unspecified: Secondary | ICD-10-CM

## 2012-03-08 DIAGNOSIS — E569 Vitamin deficiency, unspecified: Secondary | ICD-10-CM

## 2012-03-08 DIAGNOSIS — F4321 Adjustment disorder with depressed mood: Secondary | ICD-10-CM

## 2012-03-08 DIAGNOSIS — Z23 Encounter for immunization: Secondary | ICD-10-CM

## 2012-03-08 DIAGNOSIS — Z13220 Encounter for screening for lipoid disorders: Secondary | ICD-10-CM

## 2012-03-08 MED ORDER — OMEPRAZOLE 40 MG PO CPDR: 40.0000 mg | DELAYED_RELEASE_CAPSULE | Freq: Every day | ORAL | Status: DC

## 2012-03-08 NOTE — Patient Instructions (Signed)
If you take alleve on a daily or even 3 to 4 times per week  Take an omeprazole with it .  Try taking 2 tylenol at bedtime for the hip

## 2012-03-08 NOTE — Progress Notes (Signed)
Patient ID: Rachael Sharp, female   DOB: 09-Jun-1950, 61 y.o.   MRN: [id number]  Subjective:     Rachael Sharp is a 61 y.o. female and is here for a comprehensive physical exam. The patient reports  that in the interim she has seen a [facility] for left hip pain,  Treated for bursitis with steroid dose pack,  Now using alleve prn . 2) Feels that her thyroid is not regulated, she has multiple low thyroid symptoms including feeling near decreased concentration, and  difficulty losing weight. She isrustrated because she has been unable to get to goall weight.  Despite a 1500 cal diet and exercising 5 to 6 days per week she has achieved a wt loss of only 8 lbs in 4 months    History   Social History  . Marital Status: Married    Spouse Name: N/A    Number of Children: N/A  . Years of Education: N/A   Occupational History  . Not on file.   Social History Main Topics  . Smoking status: Former Smoker    Types: Cigarettes    Quit date: 08/31/1989  . Smokeless tobacco: Never Used   Comment: Quit 1991  . Alcohol Use: Yes  . Drug Use: No  . Sexually Active: Not on file   Other Topics Concern  . Not on file   Social History Narrative  . No narrative on file   Health Maintenance  Topic Date Due  . Pap Smear  01/26/1969  . Tetanus/tdap  01/26/1970  . Influenza Vaccine  01/09/2013  . Mammogram  09/07/2013  . Colonoscopy  08/07/2019  . Zostavax  Completed    The following portions of the patient's history were reviewed and updated as appropriate: allergies, current medications, past family history, past medical history, past social history, past surgical history and problem list.  Review of Systems A comprehensive review of systems was negative except for: Constitutional: positive for fatigue   Objective:   General appearance: alert, cooperative and appears stated age Ears: normal TM's and external ear canals both ears Throat: lips, mucosa, and tongue normal; teeth and  gums normal Scalp: normal distribution of hair, some thinning noted  Neck: no adenopathy, no carotid bruit, supple, symmetrical, trachea midline and thyroid not enlarged, symmetric, no tenderness/mass/nodules Back: symmetric, no curvature. ROM normal. No CVA tenderness. Breasts: breasts appear normal, no suspicious masses, no skin or nipple changes or axillary nodes.. Lungs: clear to auscultation bilaterally Heart: regular rate and rhythm, S1, S2 normal, no murmur, click, rub or gallop Abdomen: soft, non-tender; bowel sounds normal; no masses,  no organomegaly Pulses: 2+ and symmetric Skin: Skin color, texture, turgor normal. No rashes or lesions Lymph nodes: Cervical, supraclavicular, and axillary nodes normal Neuro: grossly nonfocal,  Normal DTRs      Assessment:     Routine general medical examination at a health care facility Annual exam without pelvic was done today. She will return for fasting labs and thyroid evaluation.  Fatigue Accompanied by multiple low thyroid symptoms including hair loss myalgias, decreased concentration and an inability to lose weight despite aggressive exercise and diet. We discussed resuming her thyroid function and if normal, will initiate trial of Wellbutrin.    Updated Medication List Outpatient Encounter Prescriptions as of 03/08/2012  Medication Sig Dispense Refill  . amitriptyline (ELAVIL) 10 MG tablet Take 30 mg by mouth at bedtime.       Marland Kitchen estradiol (VIVELLE-DOT) 0.05 MG/24HR Apply one patch two times a  week. Dosage 0.0375      . docusate (COLACE) 50 MG/5ML liquid 2 or 3 drops in each ear daily to soften ear wax  10 mL  0  . omeprazole (PRILOSEC) 40 MG capsule Take 1 capsule (40 mg total) by mouth daily.  30 capsule  3

## 2012-03-09 ENCOUNTER — Other Ambulatory Visit (INDEPENDENT_AMBULATORY_CARE_PROVIDER_SITE_OTHER): Payer: Federal, State, Local not specified - PPO

## 2012-03-09 ENCOUNTER — Encounter: Payer: Self-pay | Admitting: Internal Medicine

## 2012-03-09 DIAGNOSIS — M130 Polyarthritis, unspecified: Secondary | ICD-10-CM

## 2012-03-09 DIAGNOSIS — E569 Vitamin deficiency, unspecified: Secondary | ICD-10-CM

## 2012-03-09 DIAGNOSIS — R5383 Other fatigue: Secondary | ICD-10-CM | POA: Insufficient documentation

## 2012-03-09 DIAGNOSIS — Z13220 Encounter for screening for lipoid disorders: Secondary | ICD-10-CM

## 2012-03-09 DIAGNOSIS — R5381 Other malaise: Secondary | ICD-10-CM

## 2012-03-09 LAB — CBC WITH DIFFERENTIAL/PLATELET
Eosinophils Relative: 0.8 % (ref 0.0–5.0)
Monocytes Relative: 7.9 % (ref 3.0–12.0)
Neutrophils Relative %: 44.6 % (ref 43.0–77.0)
Platelets: 279 10*3/uL (ref 150.0–400.0)
RBC: 4.86 Mil/uL (ref 3.87–5.11)
WBC: 4.9 10*3/uL (ref 4.5–10.5)

## 2012-03-09 LAB — COMPREHENSIVE METABOLIC PANEL
ALT: 36 U/L — ABNORMAL HIGH (ref 0–35)
AST: 29 U/L (ref 0–37)
CO2: 31 mEq/L (ref 19–32)
Calcium: 9.8 mg/dL (ref 8.4–10.5)
Chloride: 105 mEq/L (ref 96–112)
Creatinine, Ser: 1.1 mg/dL (ref 0.4–1.2)
GFR: 53.65 mL/min — ABNORMAL LOW (ref >60.00)
Sodium: 142 mEq/L (ref 135–145)
Total Protein: 7.4 g/dL (ref 6.0–8.3)

## 2012-03-09 LAB — LIPID PANEL
Cholesterol: 230 mg/dL — ABNORMAL HIGH (ref 0–200)
Total CHOL/HDL Ratio: 3
VLDL: 23.6 mg/dL (ref 0.0–40.0)

## 2012-03-09 LAB — C-REACTIVE PROTEIN: CRP: <0.5 mg/dL (ref 0.5–20.0)

## 2012-03-09 LAB — LDL CHOLESTEROL, DIRECT: Direct LDL: 138.1 mg/dL

## 2012-03-09 LAB — TSH: TSH: 0.67 u[IU]/mL (ref 0.35–5.50)

## 2012-03-09 NOTE — Assessment & Plan Note (Signed)
Annual exam without pelvic was done today. She will return for fasting labs and thyroid evaluation.

## 2012-03-09 NOTE — Assessment & Plan Note (Signed)
Accompanied by multiple low thyroid symptoms including hair loss myalgias, decreased concentration and an inability to lose weight despite aggressive exercise and diet. We discussed resuming her thyroid function and if normal, will initiate trial of Wellbutrin.

## 2012-03-18 ENCOUNTER — Telehealth: Payer: Self-pay | Admitting: Internal Medicine

## 2012-03-18 DIAGNOSIS — Z7189 Other specified counseling: Secondary | ICD-10-CM

## 2012-03-18 NOTE — Telephone Encounter (Signed)
Message sent to [facility] to schedule appt.

## 2012-03-18 NOTE — Telephone Encounter (Signed)
Pt was referred to the hospice crisis counseling in [place] but knew some of the people there and she would prefer to speak with people who do not know her family she would like to go to the one in [place] if possible.

## 2012-03-18 NOTE — Telephone Encounter (Signed)
Referral in epic

## 2012-04-13 ENCOUNTER — Telehealth: Payer: Self-pay

## 2012-04-13 MED ORDER — BUPROPION HCL 75 MG PO TABS: 75.0000 mg | ORAL_TABLET | Freq: Two times a day (BID) | ORAL | Status: DC

## 2012-04-13 NOTE — Telephone Encounter (Signed)
Patient was seen in office last month and discussed Welbutrin, she stated that she would like a rx sent [facility]. She stated that if she needs to come back in for a visit she will do it.

## 2012-04-13 NOTE — Telephone Encounter (Signed)
No she does not have to return for an OV before starting it but I would like to follow up in one month to see how it is working.  I will send the lowest dose for starting,  75 mg twice daily.  Take second dose by 2 PM to avoid affecting her sleep,  rx sent to pharmacy

## 2012-04-14 NOTE — Telephone Encounter (Signed)
Patient notified as instructed. 

## 2012-05-17 ENCOUNTER — Encounter: Payer: Self-pay | Admitting: Internal Medicine

## 2012-05-17 ENCOUNTER — Ambulatory Visit (INDEPENDENT_AMBULATORY_CARE_PROVIDER_SITE_OTHER): Payer: Federal, State, Local not specified - PPO | Admitting: Internal Medicine

## 2012-05-17 VITALS — BP 120/80 | HR 90 | Temp 97.9°F | Resp 16 | Wt 159.8 lb

## 2012-05-17 DIAGNOSIS — F329 Major depressive disorder, single episode, unspecified: Secondary | ICD-10-CM

## 2012-05-17 DIAGNOSIS — J0100 Acute maxillary sinusitis, unspecified: Secondary | ICD-10-CM | POA: Insufficient documentation

## 2012-05-17 DIAGNOSIS — F4329 Adjustment disorder with other symptoms: Secondary | ICD-10-CM | POA: Insufficient documentation

## 2012-05-17 MED ORDER — BUPROPION HCL 100 MG PO TABS: 100.0000 mg | ORAL_TABLET | Freq: Two times a day (BID) | ORAL | Status: DC

## 2012-05-17 MED ORDER — LEVOFLOXACIN 500 MG PO TABS: 500.0000 mg | ORAL_TABLET | Freq: Every day | ORAL | Status: DC

## 2012-05-17 NOTE — Progress Notes (Signed)
Patient ID: Rachael Sharp, female   DOB: 08/15/1950, 62 y.o.   MRN: [id number]    Patient Active Problem List  Diagnosis  . Heart murmur  . GERD (gastroesophageal reflux disease)  . Dystrophy, vulva  . Mass of breast, right  . Routine general medical examination at a health care facility  . Fatigue  . Prolonged grief reaction  . Sinusitis, acute maxillary    Subjective:  CC:   Chief Complaint  Patient presents with  . Follow-up    Welbutrin    HPI:   Rachael Sharp a 62 y.o. female who presents for follow up on wellbutrin started one month ago for complicated for grief reaction following the death of her sister.  The wellbutrin has helped but she has only recently started to have some relief,  after 3 to 4 weeks of medication initiation.  Still cries easily but all in all is feeling better.  Sleep not affected  bc she takes amitryptiline at beditme.    2) post nasal drip with left sided tooth pain ear pain and throat pain for the past 3 weeks.  No fevers, purulent drainage .  Past Medical History  Diagnosis Date  . Heart murmur     mitral valve prolapse  . GERD (gastroesophageal reflux disease)     Past Surgical History  Procedure Date  . Abdominal hysterectomy   . Breast surgery 2002    biopsy x 2 , 1976  . Lipoma excision 1983         The following portions of the patient's history were reviewed and updated as appropriate: Allergies, current medications, and problem list.    Review of Systems:   Patient denies fevers, unintentional weight loss, skin rash, eye pain, dysphagia,  hemoptysis , cough, dyspnea, wheezing, chest pain, palpitations, orthopnea, edema, abdominal pain, nausea, melena, diarrhea, constipation, flank pain, dysuria, hematuria, urinary  Frequency, nocturia, numbness, tingling, seizures,  Focal weakness, Loss of consciousness,  Tremor, insomnia, depression, anxiety, and suicidal ideation.       History   Social History  .  Marital Status: Married    Spouse Name: N/A    Number of Children: N/A  . Years of Education: N/A   Occupational History  . Not on file.   Social History Main Topics  . Smoking status: Former Smoker    Types: Cigarettes    Quit date: 08/31/1989  . Smokeless tobacco: Never Used     Comment: Quit 1991  . Alcohol Use: Yes  . Drug Use: No  . Sexually Active: Not on file   Other Topics Concern  . Not on file   Social History Narrative  . No narrative on file    Objective:  BP 120/80  Pulse 90  Temp 97.9 F (36.6 C) (Oral)  Resp 16  Wt 159 lb 12 oz (72.462 kg)  SpO2 98%  General appearance: alert, cooperative and appears stated age Ears: Left TM occluded by cerumen. normal TM's and external ear canals both ears Throat: lips, mucosa, and tongue normal; teeth and gums normal Sinuses: Left maxillary tenderness to palpation. Right maxillary nontender. Neck: no adenopathy, no carotid bruit, supple, symmetrical, trachea midline and thyroid not enlarged, symmetric, no tenderness/mass/nodules Back: symmetric, no curvature. ROM normal. No CVA tenderness. Lungs: clear to auscultation bilaterally Heart: regular rate and rhythm, S1, S2 normal, no murmur, click, rub or gallop Abdomen: soft, non-tender; bowel sounds normal; no masses,  no organomegaly Pulses: 2+ and symmetric Skin: Skin color, texture,  turgor normal. No rashes or lesions Lymph nodes: Tender enlargement of the left anterior cervical nodes.  Assessment and Plan:  Prolonged grief reaction She has some response to low-dose Wellbutrin. She has begun exercising again and is not crying as easily as before and not daily. She has no untoward side effects. We discussed increasing her dose to 100 mg twice daily.  Sinusitis, acute maxillary Left-sided, with tooth pain lymphadenopathy and ear pain. Empiric antibiotics for treatment of sinusitis/otitis following an upper respiratory infection.   Updated Medication  List Outpatient Encounter Prescriptions as of 05/17/2012  Medication Sig Dispense Refill  . amitriptyline (ELAVIL) 10 MG tablet Take 30 mg by mouth at bedtime.       Marland Kitchen estradiol (VIVELLE-DOT) 0.05 MG/24HR Apply one patch two times a week. Dosage 0.0375      . [DISCONTINUED] buPROPion (WELLBUTRIN) 75 MG tablet Take 1 tablet (75 mg total) by mouth 2 (two) times daily.  60 tablet  1  . buPROPion (WELLBUTRIN) 100 MG tablet Take 1 tablet (100 mg total) by mouth 2 (two) times daily.  60 tablet  2  . docusate (COLACE) 50 MG/5ML liquid 2 or 3 drops in each ear daily to soften ear wax  10 mL  0  . levofloxacin (LEVAQUIN) 500 MG tablet Take 1 tablet (500 mg total) by mouth daily.  7 tablet  0  . omeprazole (PRILOSEC) 40 MG capsule Take 1 capsule (40 mg total) by mouth daily.  30 capsule  3     No orders of the defined types were placed in this encounter.    No Follow-up on file.

## 2012-05-17 NOTE — Assessment & Plan Note (Signed)
Left-sided, with tooth pain lymphadenopathy and ear pain. Empiric antibiotics for treatment of sinusitis/otitis following an upper respiratory infection.

## 2012-05-17 NOTE — Patient Instructions (Addendum)
Increase your wellbutrin to 1.5 tablets twice daily until current bottle is done.  Then refill at 100 mg twice daily or call for alternative  dose.   -----------------------------------------------------------------------------------------------------------------------------  You have a sinus/ear infection   .  I am prescribing an antibiotic (levaquin)  To manage the infection and the inflammation in your ear/sinuses.   I also advise use of the following OTC meds to help with your other symptoms.   Take generic OTC benadryl 25 mg every 8 hours for the drainage,  Sudafed PE  10 mg every 8 hours for the congestion and ear pain ,  flushes your sinuses twice daily with Simply Saline (do over the sink because if you do it right you will spit out globs of mucus)   Gargle with salt water or alkalol as needed for sore throat.

## 2012-05-17 NOTE — Assessment & Plan Note (Signed)
She has some response to low-dose Wellbutrin. She has begun exercising again and is not crying as easily as before and not daily. She has no untoward side effects. We discussed increasing her dose to 100 mg twice daily.

## 2012-05-23 ENCOUNTER — Encounter: Payer: Self-pay | Admitting: Internal Medicine

## 2012-08-02 ENCOUNTER — Other Ambulatory Visit: Payer: Self-pay

## 2012-08-02 DIAGNOSIS — Z1231 Encounter for screening mammogram for malignant neoplasm of breast: Secondary | ICD-10-CM

## 2012-09-08 ENCOUNTER — Ambulatory Visit
Admission: RE | Admit: 2012-09-08 | Discharge: 2012-09-08 | Disposition: A | Discharge status: Home / Self Care | Payer: Federal, State, Local not specified - PPO | Source: Ambulatory Visit

## 2012-09-08 DIAGNOSIS — Z1231 Encounter for screening mammogram for malignant neoplasm of breast: Secondary | ICD-10-CM

## 2013-01-11 ENCOUNTER — Ambulatory Visit (INDEPENDENT_AMBULATORY_CARE_PROVIDER_SITE_OTHER): Payer: Federal, State, Local not specified - PPO | Admitting: Internal Medicine

## 2013-01-11 ENCOUNTER — Encounter: Payer: Self-pay | Admitting: Internal Medicine

## 2013-01-11 VITALS — BP 102/74 | HR 67 | Temp 98.0°F | Resp 12 | Ht 65.5 in | Wt 157.2 lb

## 2013-01-11 DIAGNOSIS — D649 Anemia, unspecified: Secondary | ICD-10-CM

## 2013-01-11 DIAGNOSIS — R5381 Other malaise: Secondary | ICD-10-CM

## 2013-01-11 DIAGNOSIS — R5383 Other fatigue: Secondary | ICD-10-CM

## 2013-01-11 DIAGNOSIS — D45 Polycythemia vera: Secondary | ICD-10-CM

## 2013-01-11 DIAGNOSIS — E538 Deficiency of other specified B group vitamins: Secondary | ICD-10-CM

## 2013-01-11 LAB — CBC WITH DIFFERENTIAL/PLATELET
Basophils Relative: 0.6 % (ref 0.0–3.0)
HCT: 47 % — ABNORMAL HIGH (ref 36.0–46.0)
MCV: 92.3 fl (ref 78.0–100.0)
Neutro Abs: 2.7 10*3/uL (ref 1.4–7.7)
Neutrophils Relative %: 44.1 % (ref 43.0–77.0)
Platelets: 292 10*3/uL (ref 150.0–400.0)
RDW: 14.2 % (ref 11.5–14.6)
WBC: 6 10*3/uL (ref 4.5–10.5)

## 2013-01-11 MED ORDER — BUPROPION HCL 75 MG PO TABS: 75.0000 mg | ORAL_TABLET | Freq: Two times a day (BID) | ORAL | Status: DC

## 2013-01-11 MED ORDER — CYANOCOBALAMIN 1000 MCG/ML IJ SOLN
1000.0000 ug | Freq: Once | INTRAMUSCULAR | Status: AC
  Administered 2013-01-11: 1000 ug via INTRAMUSCULAR

## 2013-01-11 NOTE — Patient Instructions (Addendum)
Resume wellbutrin at 75 mg starting dose,  Take on tablet in the morning and add second dose if needed by 2 pm to avoid insomnia  We can add an anti anxiety medication down the road if needed

## 2013-01-12 ENCOUNTER — Encounter: Payer: Self-pay | Admitting: Internal Medicine

## 2013-01-12 NOTE — Progress Notes (Signed)
Patient ID: Rachael Sharp, female   DOB: 1951-03-11, 62 y.o.   MRN: [id number]  Patient Active Problem List   Diagnosis Date Noted  . Anemia 01/11/2013  . Routine general medical examination at a health care facility 03/09/2012  . Fatigue 03/09/2012  . 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 Mass of breast, right 06/25/2011  . Heart murmur   . GERD (gastroesophageal reflux disease)     Subjective:  CC:   Chief Complaint  Patient presents with  . Follow-up    Discuss medication    HPI:   Rachael Sharp a 62 y.o. 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 After her last visit in January she did not tolerate the increased dose of Wellbutrin due to development of insomnia and discontinue the medication. She has been off of the medication for several months now and feels it is time to resume something. She is fatigued all the time and tearful. This is a particularly difficult time of year for her as this is  the anniversary of her sister's death due to a massive stroke at age 61.  She is also noting increased fatigue despite exercising on a regular basis. She is sleeping better without the Wellbutrin does not think that the problem is lack of adequate sleep. She has no history of snoring. She drinks one to 2 glasses of wine per night. She  is a former smoker and has never resumed since she quit over 10 years ago. She is wondering she has B12 deficiency and she has a history of anemia.Marland Kitchen   2) post nasal drip with left sided tooth pain ear pain and throat pain for the past 3 weeks.  No fevers, purulent drainage .   Past Medical History  Diagnosis Date  . Heart murmur     mitral valve prolapse  . GERD (gastroesophageal reflux disease)     Past Surgical History  Procedure Laterality Date  . Abdominal hysterectomy    . Breast surgery  2002    biopsy x 2 , 1976  . Lipoma excision  1983       The following portions of the patient's history were  reviewed and updated as appropriate: Allergies, current medications, and problem list.    Review of Systems:   12 Pt  review of systems was negative except those addressed in the HPI,     History   Social History  . Marital Status: Married    Spouse Name: N/A    Number of Children: N/A  . Years of Education: N/A   Occupational History  . Not on file.   Social History Main Topics  . Smoking status: Former Smoker    Types: Cigarettes    Quit date: 08/31/1989  . Smokeless tobacco: Never Used     Comment: Quit 1991  . Alcohol Use: Yes  . Drug Use: No  . Sexual Activity: Yes   Other Topics Concern  . Not on file   Social History Narrative  . No narrative on file    Objective:  Filed Vitals:   01/11/13 1126  BP: 102/74  Pulse: 67  Temp: 98 F (36.7 C)  Resp: 12     General appearance: alert, cooperative and appears stated age Ears: normal TM's and external ear canals both ears Throat: lips, mucosa, and tongue normal; teeth and gums normal Neck: no adenopathy, no carotid bruit, supple, symmetrical, trachea midline and thyroid not enlarged, symmetric, no tenderness/mass/nodules Back: symmetric, no  curvature. ROM normal. No CVA tenderness. Lungs: clear to auscultation bilaterally Heart: regular rate and rhythm, S1, S2 normal, no murmur, click, rub or gallop Abdomen: soft, non-tender; bowel sounds normal; no masses,  no organomegaly Pulses: 2+ and symmetric Skin: Skin color, texture, turgor normal. No rashes or lesions Lymph nodes: Cervical, supraclavicular, and axillary nodes normal.  Assessment and Plan:  Anemia Remote with repeat CBC today concerning for possible polycythemia. Her hemoglobin was 15.1 last time and 15.9 now. Given her since her sister's massive stroke at age 15 with no available information about her risk factors I will refer patient to Dr. Orlie Dakin for evaluation and rule out of PCV.  Fatigue I do not think there Lucina to be causing her  fatigue. This is likely due to untreated depression. Resume Wellbutrin.   Updated Medication List Outpatient Encounter Prescriptions as of 01/11/2013  Medication Sig Dispense Refill  . amitriptyline (ELAVIL) 10 MG tablet Take 30 mg by mouth at bedtime.       Marland Kitchen estradiol (VIVELLE-DOT) 0.05 MG/24HR Apply one patch two times a week. Dosage 0.0375      . buPROPion (WELLBUTRIN) 75 MG tablet Take 1 tablet (75 mg total) by mouth 2 (two) times daily.  60 tablet  1  . docusate (COLACE) 50 MG/5ML liquid 2 or 3 drops in each ear daily to soften ear wax  10 mL  0  . omeprazole (PRILOSEC) 40 MG capsule Take 1 capsule (40 mg total) by mouth daily.  30 capsule  3  . [DISCONTINUED] levofloxacin (LEVAQUIN) 500 MG tablet Take 1 tablet (500 mg total) by mouth daily.  7 tablet  0  . [EXPIRED] cyanocobalamin ((VITAMIN B-12)) injection 1,000 mcg        No facility-administered encounter medications on file as of 01/11/2013.     Orders Placed This Encounter  Procedures  . CBC with Differential  . B12    No Follow-up on file.

## 2013-01-12 NOTE — Assessment & Plan Note (Signed)
I do not think there Lucina to be causing her fatigue. This is likely due to untreated depression. Resume Wellbutrin.

## 2013-01-12 NOTE — Assessment & Plan Note (Signed)
Remote with repeat CBC today concerning for possible polycythemia. Her hemoglobin was 15.1 last time and 15.9 now. Given her since her sister's massive stroke at age 62 with no available information about her risk factors I will refer patient to Dr. Orlie Dakin for evaluation and rule out of PCV.

## 2013-01-13 ENCOUNTER — Ambulatory Visit: Payer: Self-pay | Admitting: Internal Medicine

## 2013-01-16 NOTE — Telephone Encounter (Signed)
Called and spoke to pt, notified of results and referral in process.

## 2013-01-23 ENCOUNTER — Ambulatory Visit: Payer: Self-pay | Admitting: Internal Medicine

## 2013-01-23 LAB — IRON AND TIBC
Iron Bind.Cap.(Total): 314 ug/dL (ref 250–450)
Iron Saturation: 24 %
Iron: 74 ug/dL (ref 50–170)

## 2013-01-23 LAB — CBC CANCER CENTER
Eosinophil #: 0 x10 3/mm (ref 0.0–0.7)
Eosinophil %: 0.8 %
HCT: 44.5 % (ref 35.0–47.0)
MCH: 31.1 pg (ref 26.0–34.0)
MCHC: 33.7 g/dL (ref 32.0–36.0)
MCV: 92 fL (ref 80–100)
Monocyte %: 5.6 %
RBC: 4.83 10*6/uL (ref 3.80–5.20)

## 2013-01-30 LAB — CBC CANCER CENTER
HCT: 42.9 % (ref 35.0–47.0)
HGB: 14.4 g/dL (ref 12.0–16.0)
MCH: 31 pg (ref 26.0–34.0)
MCV: 92 fL (ref 80–100)
Platelet: 267 x10 3/mm (ref 150–440)

## 2013-02-08 ENCOUNTER — Ambulatory Visit: Payer: Self-pay | Admitting: Internal Medicine

## 2013-03-13 ENCOUNTER — Ambulatory Visit (INDEPENDENT_AMBULATORY_CARE_PROVIDER_SITE_OTHER): Payer: Federal, State, Local not specified - PPO | Admitting: Internal Medicine

## 2013-03-13 ENCOUNTER — Encounter: Payer: Self-pay | Admitting: Internal Medicine

## 2013-03-13 VITALS — BP 118/76 | HR 75 | Temp 98.7°F | Resp 14 | Ht 65.5 in | Wt 158.2 lb

## 2013-03-13 DIAGNOSIS — F341 Dysthymic disorder: Secondary | ICD-10-CM

## 2013-03-13 DIAGNOSIS — D751 Secondary polycythemia: Secondary | ICD-10-CM

## 2013-03-13 DIAGNOSIS — Z0000 Encounter for general adult medical examination without abnormal findings: Secondary | ICD-10-CM

## 2013-03-13 DIAGNOSIS — Z13220 Encounter for screening for lipoid disorders: Secondary | ICD-10-CM

## 2013-03-13 DIAGNOSIS — R5381 Other malaise: Secondary | ICD-10-CM

## 2013-03-13 DIAGNOSIS — Z23 Encounter for immunization: Secondary | ICD-10-CM

## 2013-03-13 DIAGNOSIS — F418 Other specified anxiety disorders: Secondary | ICD-10-CM | POA: Insufficient documentation

## 2013-03-13 DIAGNOSIS — Z1211 Encounter for screening for malignant neoplasm of colon: Secondary | ICD-10-CM

## 2013-03-13 MED ORDER — BUPROPION HCL 75 MG PO TABS: 75.0000 mg | ORAL_TABLET | Freq: Two times a day (BID) | ORAL | Status: DC

## 2013-03-13 NOTE — Assessment & Plan Note (Signed)
Managed with wellbutrin and amitripytline for insomnia ,  No changes today

## 2013-03-13 NOTE — Patient Instructions (Signed)
I will review the labs done by Dr. Sherrlyn Hock and let you know when we should repeat your CBC  Return for fasting labs at your leisure

## 2013-03-13 NOTE — Assessment & Plan Note (Signed)
Annual comprehensive exam was done including breast exam .  All screenings have been addressed .  

## 2013-03-13 NOTE — Progress Notes (Signed)
Patient ID: Rachael Sharp, female   DOB: [birth year], 62 y.o.   MRN: [id number]    Subjective:     Rachael Sharp is a 62 y.o. female and is here for a comprehensive physical exam. The patient reports no problems.  History   Social History  . Marital Status: Married    Spouse Name: N/A    Number of Children: N/A  . Years of Education: N/A   Occupational History  . Not on file.   Social History Main Topics  . Smoking status: Former Smoker    Types: Cigarettes    Quit date: 08/31/1989  . Smokeless tobacco: Never Used     Comment: Quit 1991  . Alcohol Use: Yes  . Drug Use: No  . Sexual Activity: Yes   Other Topics Concern  . Not on file   Social History Narrative  . No narrative on file   Health Maintenance  Topic Date Due  . Pap Smear  01/26/1969  . Influenza Vaccine  12/09/2012  . Mammogram  09/09/2014  . Colonoscopy  08/07/2019  . Tetanus/tdap  11/08/2020  . Zostavax  Completed    The following portions of the patient's history were reviewed and updated as appropriate: allergies, current medications, past family history, past medical history, past social history, past surgical history and problem list.  Review of Systems A comprehensive review of systems was negative.   Objective:   BP 118/76  Pulse 75  Temp(Src) 98.7 F (37.1 C) (Oral)  Resp 14  Ht 5' 5.5" (1.664 m)  Wt 158 lb 4 oz (71.782 kg)  BMI 25.92 kg/m2  SpO2 99%  General Appearance:    Alert, cooperative, no distress, appears stated age  Head:    Normocephalic, without obvious abnormality, atraumatic  Eyes:    PERRL, conjunctiva/corneas clear, EOM's intact, fundi    benign, both eyes  Ears:    Normal TM's and external ear canals, both ears  Nose:   Nares normal, septum midline, mucosa normal, no drainage    or sinus tenderness  Throat:   Lips, mucosa, and tongue normal; teeth and gums normal  Neck:   Supple, symmetrical, trachea midline, no adenopathy;    thyroid:  no  enlargement/tenderness/nodules; no carotid   bruit or JVD  Back:     Symmetric, no curvature, ROM normal, no CVA tenderness  Lungs:     Clear to auscultation bilaterally, respirations unlabored  Chest Wall:    No tenderness or deformity   Heart:    Regular rate and rhythm, S1 and S2 normal, no murmur, rub   or gallop  Breast Exam:    No tenderness, masses, or nipple abnormality  Abdomen:     Soft, non-tender, bowel sounds active all four quadrants,    no masses, no organomegaly  Extremities:   Extremities normal, atraumatic, no cyanosis or edema  Pulses:   2+ and symmetric all extremities  Skin:   Skin color, texture, turgor normal, no rashes or lesions  Lymph nodes:   Cervical, supraclavicular, and axillary nodes normal  Neurologic:   CNII-XII intact, normal strength, sensation and reflexes    throughout    Assessment and Plan:  Polycythemia, secondary She was referred to hematology in September for hgb > 15 x 2.  repated testign was done by Dr. Sherrlyn Hock and normal   Routine general medical examination at a health care facility Annual comprehensive exam was done including breast exam. All screenings have been addressed .   Depression  with anxiety Managed with wellbutrin and amitripytline for insomnia ,  No changes today    Updated Medication List Outpatient Encounter Prescriptions as of 03/13/2013  Medication Sig  . amitriptyline (ELAVIL) 10 MG tablet Take 30 mg by mouth at bedtime.   Marland Kitchen buPROPion (WELLBUTRIN) 75 MG tablet Take 1 tablet (75 mg total) by mouth 2 (two) times daily.  Marland Kitchen estradiol (VIVELLE-DOT) 0.05 MG/24HR Apply one patch two times a week. Dosage 0.0375  . [DISCONTINUED] buPROPion (WELLBUTRIN) 75 MG tablet Take 1 tablet (75 mg total) by mouth 2 (two) times daily.  . [DISCONTINUED] docusate (COLACE) 50 MG/5ML liquid 2 or 3 drops in each ear daily to soften ear wax  . [DISCONTINUED] omeprazole (PRILOSEC) 40 MG capsule Take 1 capsule (40 mg total) by mouth daily.

## 2013-03-13 NOTE — Assessment & Plan Note (Signed)
She was referred to hematology in September for hgb > 15 x 2.  repated testign was done by Dr. Sherrlyn Hock and normal

## 2013-03-22 ENCOUNTER — Other Ambulatory Visit (INDEPENDENT_AMBULATORY_CARE_PROVIDER_SITE_OTHER): Payer: Federal, State, Local not specified - PPO

## 2013-03-22 DIAGNOSIS — Z13220 Encounter for screening for lipoid disorders: Secondary | ICD-10-CM

## 2013-03-22 DIAGNOSIS — R5381 Other malaise: Secondary | ICD-10-CM

## 2013-03-22 LAB — COMPREHENSIVE METABOLIC PANEL
Albumin: 4.4 g/dL (ref 3.5–5.2)
CO2: 30 mEq/L (ref 19–32)
Chloride: 101 mEq/L (ref 96–112)
GFR: 55.2 mL/min — ABNORMAL LOW (ref >60.00)
Glucose, Bld: 107 mg/dL — ABNORMAL HIGH (ref 70–99)
Potassium: 4.8 mEq/L (ref 3.5–5.1)
Sodium: 137 mEq/L (ref 135–145)
Total Protein: 7.2 g/dL (ref 6.0–8.3)

## 2013-03-22 LAB — TSH: TSH: 2.62 u[IU]/mL (ref 0.35–5.50)

## 2013-03-22 LAB — LIPID PANEL: VLDL: 20.2 mg/dL (ref 0.0–40.0)

## 2013-03-23 ENCOUNTER — Encounter: Payer: Self-pay | Admitting: Internal Medicine

## 2013-03-27 NOTE — Telephone Encounter (Signed)
Mailed unread message to pt  

## 2013-06-02 ENCOUNTER — Encounter: Payer: Self-pay | Admitting: Internal Medicine

## 2013-06-08 ENCOUNTER — Telehealth: Payer: Self-pay | Admitting: Internal Medicine

## 2013-06-08 DIAGNOSIS — L659 Nonscarring hair loss, unspecified: Secondary | ICD-10-CM

## 2013-06-13 ENCOUNTER — Other Ambulatory Visit: Payer: Federal, State, Local not specified - PPO

## 2013-06-13 DIAGNOSIS — L659 Nonscarring hair loss, unspecified: Secondary | ICD-10-CM

## 2013-06-14 LAB — TSH+FREE T4
Free T4: 1.11 ng/dL (ref 0.82–1.77)
TSH: 3.8 u[IU]/mL (ref 0.450–4.500)

## 2013-06-15 ENCOUNTER — Encounter: Payer: Self-pay | Admitting: Internal Medicine

## 2013-08-01 ENCOUNTER — Other Ambulatory Visit: Payer: Self-pay

## 2013-08-01 DIAGNOSIS — Z1231 Encounter for screening mammogram for malignant neoplasm of breast: Secondary | ICD-10-CM

## 2013-08-22 ENCOUNTER — Telehealth: Payer: Self-pay | Admitting: Internal Medicine

## 2013-08-22 NOTE — Telephone Encounter (Signed)
Pt left vm asking for rx for mild muscle relaxer.  States she is having a muscle spasm in shoulder blade and up toward neck x 3 days. No results with Advair and heat.  Contacted pt.  Appt made with R. Rey 4/15 @ 2:15, pt agrees.  Pt would like to see Dr. Derrel Nip sooner if possible.  If not, she will see Raquel as scheduled.

## 2013-08-22 NOTE — Telephone Encounter (Signed)
FYI No appointment available this afternoon.

## 2013-08-23 ENCOUNTER — Encounter: Payer: Self-pay | Admitting: Adult Health

## 2013-08-23 ENCOUNTER — Ambulatory Visit (INDEPENDENT_AMBULATORY_CARE_PROVIDER_SITE_OTHER): Payer: Federal, State, Local not specified - PPO | Admitting: Adult Health

## 2013-08-23 VITALS — BP 110/72 | HR 72 | Temp 98.2°F | Resp 16 | Wt 159.0 lb

## 2013-08-23 DIAGNOSIS — M5380 Other specified dorsopathies, site unspecified: Secondary | ICD-10-CM

## 2013-08-23 DIAGNOSIS — M62830 Muscle spasm of back: Secondary | ICD-10-CM | POA: Insufficient documentation

## 2013-08-23 MED ORDER — CYCLOBENZAPRINE HCL 5 MG PO TABS: 5.0000 mg | ORAL_TABLET | Freq: Three times a day (TID) | ORAL | Status: DC | PRN

## 2013-08-23 NOTE — Progress Notes (Signed)
Patient ID: Rachael Sharp, female   DOB: 09/13/1950, 63 y.o.   MRN: [id number]    Subjective:    Patient ID: Rachael Sharp, female    DOB: 05/10/1951, 63 y.o.   MRN: [id number]  HPI  Pt is a pleasant 63 y/o female with muscle strain in upper back. She is experiencing muscle spasms. Getting ready to go on a trip and does not want her symptoms to get worse. She has been applying heat. Symptoms began approximately 4-5 days ago. She reports that the muscle spasms occur in clusters. These are very uncomfortable.   Past Medical History  Diagnosis Date  . Heart murmur     mitral valve prolapse  . GERD (gastroesophageal reflux disease)     Current Outpatient Prescriptions on File Prior to Visit  Medication Sig Dispense Refill  . amitriptyline (ELAVIL) 10 MG tablet Take 30 mg by mouth at bedtime.       Marland Kitchen buPROPion (WELLBUTRIN) 75 MG tablet Take 1 tablet (75 mg total) by mouth 2 (two) times daily.  180 tablet  2  . estradiol (VIVELLE-DOT) 0.05 MG/24HR Apply one patch two times a week. Dosage 0.0375       No current facility-administered medications on file prior to visit.     Review of Systems  Constitutional: Negative.   Respiratory: Negative.   Cardiovascular: Negative.   Musculoskeletal: Positive for back pain.       Muscle spasms  All other systems reviewed and are negative.      Objective:  BP 110/72  Pulse 72  Temp(Src) 98.2 F (36.8 C) (Oral)  Resp 16  Wt 159 lb (72.122 kg)  SpO2 97%   Physical Exam  Constitutional: She is oriented to person, place, and time. She appears well-developed and well-nourished. No distress.  Cardiovascular: Normal rate and regular rhythm.   Pulmonary/Chest: Effort normal. No respiratory distress.  Musculoskeletal:  Neck ROM decrease with turning head toward the left. She feels pulling in her upper back.  Neurological: She is alert and oriented to person, place, and time.  Skin: Skin is warm and dry.  Psychiatric: She has a  normal mood and affect. Her behavior is normal. Thought content normal.       Assessment & Plan:   1. Muscle spasm of back Involving trapezius muscle. Rest, ice, heat. Gentle stretching. Flexeril as directed. NSAIDS as directed.

## 2013-08-23 NOTE — Patient Instructions (Signed)
  Start flexeril 3 times a day as needed for muscle spasms.  Take ibuprofen 600 mg every 6 hours as needed. Take with food. If you prefer to take Aleve then take 1 tablet twice a day as needed.  Apply ice alternating with heat to the affected areas for 15 min at a time. Do this approximately 3-4 times daily.  Gentle stretching will also help alleviate your symptoms.  Return to clinic if your symptoms are not improving within 4 weeks or sooner if your symptoms worsen.

## 2013-08-23 NOTE — Progress Notes (Signed)
Pre visit review using our clinic review tool, if applicable. No additional management support is needed unless otherwise documented below in the visit note. 

## 2013-09-11 ENCOUNTER — Ambulatory Visit
Admission: RE | Admit: 2013-09-11 | Discharge: 2013-09-11 | Disposition: A | Discharge status: Home / Self Care | Payer: Federal, State, Local not specified - PPO | Source: Ambulatory Visit

## 2013-09-11 ENCOUNTER — Other Ambulatory Visit: Payer: Self-pay

## 2013-09-11 DIAGNOSIS — Z1231 Encounter for screening mammogram for malignant neoplasm of breast: Secondary | ICD-10-CM

## 2013-09-13 ENCOUNTER — Encounter: Payer: Self-pay | Admitting: Internal Medicine

## 2013-09-20 NOTE — Telephone Encounter (Signed)
Mailed unread message to pt  

## 2013-10-12 ENCOUNTER — Encounter: Payer: Self-pay | Admitting: Internal Medicine

## 2013-10-17 ENCOUNTER — Encounter: Payer: Self-pay | Admitting: Internal Medicine

## 2013-10-17 ENCOUNTER — Ambulatory Visit (INDEPENDENT_AMBULATORY_CARE_PROVIDER_SITE_OTHER): Payer: Federal, State, Local not specified - PPO | Admitting: Internal Medicine

## 2013-10-17 VITALS — BP 120/78 | HR 76 | Temp 98.4°F | Resp 16 | Ht 65.25 in | Wt 159.2 lb

## 2013-10-17 DIAGNOSIS — F418 Other specified anxiety disorders: Secondary | ICD-10-CM

## 2013-10-17 DIAGNOSIS — F341 Dysthymic disorder: Secondary | ICD-10-CM

## 2013-10-17 DIAGNOSIS — E538 Deficiency of other specified B group vitamins: Secondary | ICD-10-CM | POA: Insufficient documentation

## 2013-10-17 DIAGNOSIS — N904 Leukoplakia of vulva: Secondary | ICD-10-CM

## 2013-10-17 MED ORDER — BUPROPION HCL ER (XL) 150 MG PO TB24: 150.0000 mg | ORAL_TABLET | Freq: Every day | ORAL | Status: DC

## 2013-10-17 MED ORDER — CYANOCOBALAMIN 1000 MCG/ML IJ SOLN
1000.0000 ug | Freq: Once | INTRAMUSCULAR | Status: AC
  Administered 2013-10-17: 1000 ug via INTRAMUSCULAR

## 2013-10-17 NOTE — Progress Notes (Signed)
Pre-visit discussion using our clinic review tool. No additional management support is needed unless otherwise documented below in the visit note.  

## 2013-10-17 NOTE — Assessment & Plan Note (Addendum)
Currently taking wellbutrin 75 mg bid,  Higher does interfered with sleep .  Discussed options of change to SSRI vs trying higher dose of wellbutril XL taken once daily in the morning .  Given the likelihood of wt gain with SSRIs decided to try  wellbutrrin  XL starting at 150 mg daily in the AM.  Can  increase the amitryptiline dose if needed for insmonia .  Suggested seeing a Retail banker.

## 2013-10-17 NOTE — Patient Instructions (Addendum)
It's perfectly OK to take a MVI that does not have mega doses of A D E and K (fat soluble) if it helps you stay on track  Your thyroid levels did NOT indicate subclinical hypothyroidism  Try the "Hair skin and nails" vitamin complex for you and husband,  Available at  [facility]  Try the almond/coconut  unsweetened milk for calcium/vitamin D from [facility]   B12 injection today;  If it helps you energy we can continue it  I have changed wellbutrin to the Wellbutrin  XL 150 mg once daily in the morning formulation

## 2013-10-17 NOTE — Progress Notes (Signed)
Patient ID: Rachael Sharp, female   DOB: 1950/07/14, 63 y.o.   MRN: [id number]   Patient Active Problem List   Diagnosis Date Noted  . B12 deficiency 10/17/2013  . Muscle spasm of back 08/23/2013  . Polycythemia, secondary 03/13/2013  . Depression with anxiety 03/13/2013  . Routine general medical examination at a health care facility 03/09/2012  . Fatigue 03/09/2012  . 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 Heart murmur   . GERD (gastroesophageal reflux disease)     Subjective:  CC:   Chief Complaint  Patient presents with  . Follow-up    to discuss Welbutrin    HPI:   Rachael Sharp is a 63 y.o. female who presents for Worsening depressive symptoms.  2 year anniversary of her sister's death has been aggravating her mood disorder.  Cries easily, and often if discussing her sister.  No motivation to exercise or diet.  "in a rut"   Past Medical History  Diagnosis Date  . Heart murmur     mitral valve prolapse  . GERD (gastroesophageal reflux disease)     Past Surgical History  Procedure Laterality Date  . Breast surgery  2002    biopsy x 2 , 1976  . Lipoma excision  1983  . Abdominal hysterectomy  August 2002       The following portions of the patient's history were reviewed and updated as appropriate: Allergies, current medications, and problem list.    Review of Systems:   Patient denies headache, fevers, malaise, unintentional weight loss, skin rash, eye pain, sinus congestion and sinus pain, sore throat, dysphagia,  hemoptysis , cough, dyspnea, wheezing, chest pain, palpitations, orthopnea, edema, abdominal pain, nausea, melena, diarrhea, constipation, flank pain, dysuria, hematuria, urinary  Frequency, nocturia, numbness, tingling, seizures,  Focal weakness, Loss of consciousness,  Tremor, insomnia, depression, anxiety, and suicidal ideation.     History   Social History  . Marital Status: Married    Spouse Name: N/A    Number of Children: N/A   . Years of Education: N/A   Occupational History  . Not on file.   Social History Main Topics  . Smoking status: Former Smoker    Types: Cigarettes    Quit date: 08/31/1989  . Smokeless tobacco: Never Used     Comment: Quit 1991  . Alcohol Use: Yes  . Drug Use: No  . Sexual Activity: Yes   Other Topics Concern  . Not on file   Social History Narrative  . No narrative on file    Objective:  Filed Vitals:   10/17/13 1054  BP: 120/78  Pulse: 76  Temp: 98.4 F (36.9 C)  Resp: 16     General appearance: alert, cooperative and appears stated age Ears: normal TM's and external ear canals both ears Throat: lips, mucosa, and tongue normal; teeth and gums normal Neck: no adenopathy, no carotid bruit, supple, symmetrical, trachea midline and thyroid not enlarged, symmetric, no tenderness/mass/nodules Back: symmetric, no curvature. ROM normal. No CVA tenderness. Lungs: clear to auscultation bilaterally Heart: regular rate and rhythm, S1, S2 normal, no murmur, click, rub or gallop Abdomen: soft, non-tender; bowel sounds normal; no masses,  no organomegaly Pulses: 2+ and symmetric Skin: Skin color, texture, turgor normal. No rashes or lesions Lymph nodes: Cervical, supraclavicular, and axillary nodes normal.  Assessment and Plan:  Depression with anxiety Currently taking wellbutrin 75 mg bid,  Higher does interfered with sleep .  Discussed options of change to SSRI vs trying higher dose  of wellbutril XL taken once daily in the morning .  Given the likelihood of wt gain with SSRIs decided to try  wellbutrrin  XL starting at 150 mg daily in the AM.  Can  increase the amitryptiline dose if needed for insmonia .  Suggested seeing a Retail banker.   Dystrophy, vulva Managed by GYN with amitriptyline 30 mg qhs.    Updated Medication List Outpatient Encounter Prescriptions as of 10/17/2013  Medication Sig  . amitriptyline (ELAVIL) 10 MG tablet Take 30 mg by mouth at bedtime.   Marland Kitchen  estradiol (VIVELLE-DOT) 0.05 MG/24HR Apply one patch two times a week. Dosage 0.0375  . [DISCONTINUED] buPROPion (WELLBUTRIN) 75 MG tablet Take 1 tablet (75 mg total) by mouth 2 (two) times daily.  Marland Kitchen buPROPion (WELLBUTRIN XL) 150 MG 24 hr tablet Take 1 tablet (150 mg total) by mouth daily.  . [DISCONTINUED] cyclobenzaprine (FLEXERIL) 5 MG tablet Take 1 tablet (5 mg total) by mouth 3 (three) times daily as needed for muscle spasms.  . [EXPIRED] cyanocobalamin ((VITAMIN B-12)) injection 1,000 mcg      No orders of the defined types were placed in this encounter.    No Follow-up on file.

## 2013-10-18 ENCOUNTER — Encounter: Payer: Self-pay | Admitting: Internal Medicine

## 2013-10-18 NOTE — Assessment & Plan Note (Signed)
Managed by GYN with amitriptyline 30 mg qhs.

## 2013-10-25 ENCOUNTER — Encounter: Payer: Self-pay | Admitting: Internal Medicine

## 2013-10-26 NOTE — Telephone Encounter (Signed)
Did you want monthly B12 injections?

## 2013-11-01 ENCOUNTER — Telehealth: Payer: Self-pay | Admitting: *Deleted

## 2013-11-01 ENCOUNTER — Ambulatory Visit (INDEPENDENT_AMBULATORY_CARE_PROVIDER_SITE_OTHER): Payer: Federal, State, Local not specified - PPO | Admitting: *Deleted

## 2013-11-01 DIAGNOSIS — E538 Deficiency of other specified B group vitamins: Secondary | ICD-10-CM

## 2013-11-01 MED ORDER — CYANOCOBALAMIN 1000 MCG/ML IJ SOLN
1000.0000 ug | Freq: Once | INTRAMUSCULAR | Status: AC
  Administered 2013-11-01: 1000 ug via INTRAMUSCULAR

## 2013-11-01 MED ORDER — SYRINGE (DISPOSABLE) 1 ML MISC: Status: DC

## 2013-11-01 MED ORDER — CYANOCOBALAMIN 1000 MCG/ML IJ SOLN: INTRAMUSCULAR | Status: DC

## 2013-11-01 NOTE — Telephone Encounter (Signed)
My chart message sent,  rx sent

## 2013-11-01 NOTE — Telephone Encounter (Signed)
Pt came in this morning for her B12 injection instruction visit, pt is needing to know when the Rx is sent, and she also needs to know the dose she is supposed to give herself and how often.  She has asked for a MyChart message with this information.

## 2013-11-09 ENCOUNTER — Telehealth: Payer: Self-pay | Admitting: *Deleted

## 2013-11-09 NOTE — Telephone Encounter (Signed)
Pt presented to office for further B12 injection education. She was in last week for a nurse visit, has some additional questions after picking up Rx and syringes from pharmacy. Brought her husband with her.  Discussed that she needs to use a 1 inch needle for IM injections. She brought with her the syringe that was given to her, which had this size needle on it. Discussed injection site on thigh and technique for an IM injection. I watched patient draw up B12 herself and inject without difficulty. Offered for her to return next week if she would like to be supervised again, pt declined. Stated she felt comfortable continuing the injections at home.

## 2013-11-22 ENCOUNTER — Telehealth: Payer: Self-pay | Admitting: Internal Medicine

## 2013-11-28 ENCOUNTER — Ambulatory Visit: Payer: Federal, State, Local not specified - PPO | Admitting: Internal Medicine

## 2014-07-09 ENCOUNTER — Telehealth: Payer: Self-pay | Admitting: Internal Medicine

## 2014-07-09 ENCOUNTER — Encounter: Payer: Federal, State, Local not specified - PPO | Admitting: Internal Medicine

## 2014-07-09 MED ORDER — PROMETHAZINE HCL 12.5 MG RE SUPP
12.5000 mg | Freq: Four times a day (QID) | RECTAL | Status: DC | PRN
Start: 2014-07-09 — End: 2014-08-16

## 2014-07-09 NOTE — Telephone Encounter (Signed)
Patient started feeeling this way last night at 8.30 the vomiting has subsided since 3.30 am but thought she made need Tamiflu due to she feels as though she has the flu. Patient stated she will be here at 4.30 PM.

## 2014-07-09 NOTE — Telephone Encounter (Signed)
Patient has an appointment here at 4.30, very little urine output stated by patient and vomiting with bodyaches.

## 2014-07-09 NOTE — Telephone Encounter (Signed)
FYI:  Pt is scheduled to see Dr Derrel Nip today at 4:30

## 2014-07-09 NOTE — Telephone Encounter (Signed)
FYI

## 2014-07-09 NOTE — Telephone Encounter (Signed)
Tell patient to use the phnegerna suppositories I sent to her phramacy ASAP,  And start drinking pedailyte or gatorade

## 2014-07-09 NOTE — Telephone Encounter (Signed)
[facility] Patient Name: Rachael Sharp I DOB: 1951/01/30 Initial Comment Caller States she has been vomiting and aches all over. has had little urine output with the last 8 hrs. does have an appt. today with the dr. at 4:30pm. she wants to know if it is ok for her to take something now for the vomiting. Nurse Assessment Nurse: Ronnald Ramp, RN, Miranda Date/Time (Eastern Time): 07/09/2014 8:38:17 AM Confirm and document reason for call. If symptomatic, describe symptoms. ---Caller states she had vomiting over night, last episode of emesis was 5 hrs ago. She is having body aches and wants to know what she can take for that. Denies fever. Has the patient traveled out of the country within the last 30 days? ---No Does the patient require triage? ---Yes Related visit to physician within the last 2 weeks? ---No Does the PT have any chronic conditions? (i.e. diabetes, asthma, etc.) ---No Guidelines Guideline Title Affirmed Question Affirmed Notes Vomiting MILD or MODERATE vomiting (e.g., 1 - 5 times / day) (all triage questions negative) Final Disposition User [place], RN, Miranda Comments Appt already scheduled at 4:30 with Dr. Derrel Nip.

## 2014-08-16 ENCOUNTER — Ambulatory Visit: Payer: Federal, State, Local not specified - PPO | Admitting: Nurse Practitioner

## 2014-08-16 ENCOUNTER — Encounter: Payer: Self-pay | Admitting: Internal Medicine

## 2014-08-16 ENCOUNTER — Ambulatory Visit (INDEPENDENT_AMBULATORY_CARE_PROVIDER_SITE_OTHER): Payer: Federal, State, Local not specified - PPO | Admitting: Internal Medicine

## 2014-08-16 VITALS — BP 110/75 | HR 77 | Temp 97.7°F | Ht 65.0 in | Wt 157.2 lb

## 2014-08-16 DIAGNOSIS — H6120 Impacted cerumen, unspecified ear: Secondary | ICD-10-CM | POA: Insufficient documentation

## 2014-08-16 DIAGNOSIS — H6123 Impacted cerumen, bilateral: Secondary | ICD-10-CM | POA: Diagnosis not present

## 2014-08-16 NOTE — Progress Notes (Signed)
Pre visit review using our clinic review tool, if applicable. No additional management support is needed unless otherwise documented below in the visit note. 

## 2014-08-16 NOTE — Progress Notes (Signed)
   Subjective:    Patient ID: Rachael Sharp, female    DOB: 25-Mar-1951, 64 y.o.   MRN: [id number]  HPI  64YO female presents for acute visit.  Ear fullness for a few weeks. Some muffled hearing. No pain. Previously had cerumen impaction. No fever, chills, congestion. Using Debrox daily for last few days.   Past medical, surgical, family and social history per today's encounter.  Review of Systems  Constitutional: Negative for fever, chills, appetite change, fatigue and unexpected weight change.  HENT: Positive for hearing loss. Negative for congestion, ear discharge, ear pain, postnasal drip, rhinorrhea, sinus pressure, sore throat, trouble swallowing and voice change.   Eyes: Negative for visual disturbance.  Respiratory: Negative for shortness of breath.   Cardiovascular: Negative for chest pain and leg swelling.  Gastrointestinal: Negative for abdominal pain.  Skin: Negative for color change and rash.  Hematological: Negative for adenopathy. Does not bruise/bleed easily.  Psychiatric/Behavioral: Negative for dysphoric mood. The patient is not nervous/anxious.        Objective:    BP 110/75 mmHg  Pulse 77  Temp(Src) 97.7 F (36.5 C) (Oral)  Ht 5\' 5"  (1.651 m)  Wt 157 lb 4 oz (71.328 kg)  BMI 26.17 kg/m2  SpO2 99% Physical Exam  Constitutional: She is oriented to person, place, and time. She appears well-developed and well-nourished. No distress.  HENT:  Head: Normocephalic and atraumatic.  Right Ear: Tympanic membrane and external ear normal.  Left Ear: Tympanic membrane and external ear normal.  Nose: Nose normal.  Mouth/Throat: Oropharynx is clear and moist.  Bilateral ear canals initially impacted with cerumen, resolved with lavage with minimal cerumen flakes remaining  Eyes: Conjunctivae are normal. Pupils are equal, round, and reactive to light. Right eye exhibits no discharge. Left eye exhibits no discharge. No scleral icterus.  Neck: Normal range of  motion. Neck supple. No tracheal deviation present. No thyromegaly present.  Cardiovascular: Normal rate, regular rhythm, normal heart sounds and intact distal pulses.  Exam reveals no gallop and no friction rub.   No murmur heard. Pulmonary/Chest: Effort normal and breath sounds normal. No respiratory distress. She has no wheezes. She has no rales. She exhibits no tenderness.  Musculoskeletal: Normal range of motion. She exhibits no edema or tenderness.  Lymphadenopathy:    She has no cervical adenopathy.  Neurological: She is alert and oriented to person, place, and time. No cranial nerve deficit. She exhibits normal muscle tone. Coordination normal.  Skin: Skin is warm and dry. No rash noted. She is not diaphoretic. No erythema. No pallor.  Psychiatric: She has a normal mood and affect. Her behavior is normal. Judgment and thought content normal.          Assessment & Plan:   Problem List Items Addressed This Visit      Unprioritized   Cerumen impaction - Primary    Bilateral cerumen impaction, cleared with warm water lavage. Will continue prn Debrox at home.          Return if symptoms worsen or fail to improve.

## 2014-08-16 NOTE — Patient Instructions (Signed)
Cerumen Impaction °A cerumen impaction is when the wax in your ear forms a plug. This plug usually causes reduced hearing. Sometimes it also causes an earache or dizziness. Removing a cerumen impaction can be difficult and painful. The wax sticks to the ear canal. The canal is sensitive and bleeds easily. If you try to remove a heavy wax buildup with a cotton tipped swab, you may push it in further. °Irrigation with water, suction, and small ear curettes may be used to clear out the wax. If the impaction is fixed to the skin in the ear canal, ear drops may be needed for a few days to loosen the wax. People who build up a lot of wax frequently can use ear wax removal products available in your local drugstore. °SEEK MEDICAL CARE IF:  °You develop an earache, increased hearing loss, or marked dizziness. °Document Released: 06/04/2004 Document Revised: 07/20/2011 Document Reviewed: 07/25/2009 °Exit[employer]® Patient Information ©2015 [employer]. This information is not intended to replace advice given to you by your health care provider. Make sure you discuss any questions you have with your health care provider. ° °

## 2014-08-16 NOTE — Assessment & Plan Note (Signed)
Bilateral cerumen impaction, cleared with warm water lavage. Will continue prn Debrox at home.

## 2014-08-17 ENCOUNTER — Other Ambulatory Visit: Payer: Self-pay

## 2014-08-17 DIAGNOSIS — Z1231 Encounter for screening mammogram for malignant neoplasm of breast: Secondary | ICD-10-CM

## 2014-09-17 ENCOUNTER — Ambulatory Visit
Admission: RE | Admit: 2014-09-17 | Discharge: 2014-09-17 | Disposition: A | Discharge status: Home / Self Care | Payer: Federal, State, Local not specified - PPO | Source: Ambulatory Visit

## 2014-09-17 DIAGNOSIS — Z1231 Encounter for screening mammogram for malignant neoplasm of breast: Secondary | ICD-10-CM

## 2014-09-19 ENCOUNTER — Encounter: Payer: Self-pay | Admitting: Internal Medicine

## 2014-12-21 ENCOUNTER — Encounter: Payer: Self-pay | Admitting: Family Medicine

## 2014-12-21 ENCOUNTER — Ambulatory Visit (INDEPENDENT_AMBULATORY_CARE_PROVIDER_SITE_OTHER): Payer: Federal, State, Local not specified - PPO | Admitting: Family Medicine

## 2014-12-21 VITALS — BP 110/76 | HR 81 | Temp 98.5°F | Ht 65.0 in | Wt 155.2 lb

## 2014-12-21 DIAGNOSIS — R21 Rash and other nonspecific skin eruption: Secondary | ICD-10-CM

## 2014-12-21 MED ORDER — TRIAMCINOLONE ACETONIDE 0.1 % EX OINT: 1.0000 "application " | TOPICAL_OINTMENT | Freq: Two times a day (BID) | CUTANEOUS | Status: DC

## 2014-12-21 NOTE — Assessment & Plan Note (Signed)
Appears contact/allergic. Treating with topical triamcinolone.

## 2014-12-21 NOTE — Patient Instructions (Signed)
It was nice to see you today.  Use the ointment as prescribed.  Follow-up if he fails to improve or worsen.  Take care  Dr. Lacinda Axon

## 2014-12-21 NOTE — Progress Notes (Signed)
   Subjective:  Patient ID: Rachael Sharp, female    DOB: 1950/10/13  Age: 64 y.o. MRN: [id number]  CC: Rash  HPI  64 year old female presents to the clinic today for an acute visit with complaints of rash.  1) Rash  Patient reports a one-week history of an erythematous rash located at the right antecubital fossa.  No known inciting factor.  No new exposures, bug bites, tick bites, changes in laundry detergent, etc.  She denies any associated itching but states that it occasionally burns.  She has been using over-the-counter hydrocortisone with no relief.  Patient denies any associated fevers or chills.   Patient does report that the rash appears to be worsening.  Social Hx - Former Smoker.   Review of Systems  Constitutional: Negative for fever and chills.  Skin: Positive for rash.   Objective:  BP 110/76 mmHg  Pulse 81  Temp(Src) 98.5 F (36.9 C) (Oral)  Ht 5\' 5"  (1.651 m)  Wt 155 lb 4 oz (70.421 kg)  BMI 25.83 kg/m2  SpO2 98%  BP/Weight 12/21/2014 10/14/628 05/17/107  Systolic BP 323 557 322  Diastolic BP 76 75 78  Wt. (Lbs) 155.25 157.25 159.25  BMI 25.83 26.17 26.31   Physical Exam  Constitutional: She is oriented to person, place, and time. She appears well-developed and well-nourished. No distress.  Cardiovascular: Normal rate and regular rhythm.   No murmur heard. Pulmonary/Chest: Effort normal and breath sounds normal. No respiratory distress. She has no wheezes. She has no rales.  Neurological: She is alert and oriented to person, place, and time.  Skin:  Maculopapular rash noted at the right antecubital fossa.   Psychiatric: She has a normal mood and affect.  Vitals reviewed.   Assessment & Plan:   Problem List Items Addressed This Visit    Rash - Primary    Appears contact/allergic. Treating with topical triamcinolone.         Meds ordered this encounter  Medications  . triamcinolone ointment (KENALOG) 0.1 %    Sig: Apply 1  application topically 2 (two) times daily.    Dispense:  30 g    Refill:  0     Follow-up: PRN   Thersa Salt, DO

## 2014-12-21 NOTE — Progress Notes (Signed)
Pre visit review using our clinic review tool, if applicable. No additional management support is needed unless otherwise documented below in the visit note. 

## 2015-03-11 ENCOUNTER — Ambulatory Visit (INDEPENDENT_AMBULATORY_CARE_PROVIDER_SITE_OTHER): Payer: Federal, State, Local not specified - PPO | Admitting: Nurse Practitioner

## 2015-03-11 ENCOUNTER — Encounter: Payer: Self-pay | Admitting: Nurse Practitioner

## 2015-03-11 VITALS — BP 108/74 | HR 70 | Temp 98.3°F | Resp 12 | Ht 65.0 in | Wt 151.2 lb

## 2015-03-11 DIAGNOSIS — N63 Unspecified lump in unspecified breast: Secondary | ICD-10-CM

## 2015-03-11 NOTE — Progress Notes (Signed)
Patient ID: Rachael Sharp, female    DOB: 26-Jun-1950  Age: 64 y.o. MRN: [id number]  CC: Knot on Breast   HPI [name] presents for CC of mass of right breast.   1) Patient reports she found a right breast "knot" in the shower last Tuesday and it "doesn't feel right". Denies pain or redness, denies nipple discharge.  Past history of dense breast tissue with recent mammogram and in 2002 biopsy of right breast.   History Aniaya has a past medical history of Heart murmur and GERD (gastroesophageal reflux disease).   She has past surgical history that includes Breast surgery (2002); Lipoma excision (1983); and Abdominal hysterectomy (August 2002).   Her family history includes COPD in her sister; Heart disease (age of onset: 39) in her sister; Stroke in her sister.She reports that she quit smoking about 25 years ago. Her smoking use included Cigarettes. She has never used smokeless tobacco. She reports that she drinks alcohol. She reports that she does not use illicit drugs.  Outpatient Prescriptions Prior to Visit  Medication Sig Dispense Refill  . amitriptyline (ELAVIL) 10 MG tablet Take 30 mg by mouth at bedtime.     Marland Kitchen estradiol (VIVELLE-DOT) 0.05 MG/24HR Apply one patch two times a week. Dosage 0.0375    . triamcinolone ointment (KENALOG) 0.1 % Apply 1 application topically 2 (two) times daily. (Patient not taking: Reported on 03/11/2015) 30 g 0   No facility-administered medications prior to visit.    ROS Review of Systems  Constitutional: Negative for fever, chills, diaphoresis and fatigue.  Skin:       Right breast mass  Psychiatric/Behavioral: The patient is nervous/anxious.     Objective:  BP 108/74 mmHg  Pulse 70  Temp(Src) 98.3 F (36.8 C)  Resp 12  Ht 5\' 5"  (1.651 m)  Wt 151 lb 3.2 oz (68.584 kg)  BMI 25.16 kg/m2  SpO2 97%  Physical Exam  Constitutional: She is oriented to person, place, and time. She appears well-developed and well-nourished. No  distress.  Pulmonary/Chest: Effort normal. Right breast exhibits mass. Right breast exhibits no inverted nipple, no nipple discharge, no skin change and no tenderness. Left breast exhibits no inverted nipple, no mass, no nipple discharge, no skin change and no tenderness. Breasts are symmetrical.    Mobile, slightly tender, large breast mass of right breast. Dense breast tissue of left side as well  Neurological: She is alert and oriented to person, place, and time. Coordination normal.  Skin: Skin is warm and dry. No rash noted. She is not diaphoretic.  Psychiatric: She has a normal mood and affect. Her behavior is normal. Judgment and thought content normal.  Tearful and anxious today    Assessment & Plan:   Abigail was seen today for knot on breast.  Diagnoses and all orders for this visit:  Breast mass in female -     US BREAST LTD UNI RIGHT INC AXILLA; Future -     MM DIAG BREAST TOMO UNI RIGHT; Future   I have discontinued Ms. Knipp triamcinolone ointment. I am also having her maintain her amitriptyline and estradiol.  No orders of the defined types were placed in this encounter.     Follow-up: Return if symptoms worsen or fail to improve.

## 2015-03-11 NOTE — Assessment & Plan Note (Addendum)
Pt would like to be seen at [facility]. Since she has dense breast tissue BIRADS 2 of recent mammogram 09/2014 will obtain US and mm diagnostic testing of right breast. Pt is nervous today and I assured her we will help schedule her as soon as able to get this looked at. She is aware of her status of dense breast tissue and this was consistent on exam. There were no visual changes to breasts with position changes today.

## 2015-03-11 NOTE — Progress Notes (Signed)
Pre visit review using our clinic review tool, if applicable. No additional management support is needed unless otherwise documented below in the visit note. 

## 2015-03-11 NOTE — Patient Instructions (Addendum)
Your appointment is at [place]    03/14/15 Thursday at 0815 am registration time. Approximately 1 hour for the entire [place] and mammogram.  7 a.m.-6:30 p.m., Monday 7 a.m.-5 p.m., Tuesday-Friday [phone].

## 2015-03-14 ENCOUNTER — Ambulatory Visit
Admission: RE | Admit: 2015-03-14 | Discharge: 2015-03-14 | Disposition: A | Discharge status: Home / Self Care | Payer: Federal, State, Local not specified - PPO | Source: Ambulatory Visit | Attending: Nurse Practitioner | Admitting: Nurse Practitioner

## 2015-03-14 DIAGNOSIS — N63 Unspecified lump in unspecified breast: Secondary | ICD-10-CM

## 2015-03-15 ENCOUNTER — Telehealth: Payer: Self-pay | Admitting: Nurse Practitioner

## 2015-03-15 NOTE — Telephone Encounter (Signed)
Just wanted to make sure patient received mammogram/ultraound results and that they were normal. Follow up with yearly mammograms as recommended.

## 2015-05-12 HISTORY — PX: COLONOSCOPY: SHX174

## 2015-07-23 ENCOUNTER — Encounter: Payer: Self-pay | Admitting: Internal Medicine

## 2015-07-24 ENCOUNTER — Other Ambulatory Visit: Payer: Self-pay

## 2015-07-24 ENCOUNTER — Other Ambulatory Visit: Payer: Self-pay | Admitting: Family Medicine

## 2015-07-24 DIAGNOSIS — Z1231 Encounter for screening mammogram for malignant neoplasm of breast: Secondary | ICD-10-CM

## 2015-07-24 DIAGNOSIS — Z9189 Other specified personal risk factors, not elsewhere classified: Secondary | ICD-10-CM

## 2015-07-25 ENCOUNTER — Other Ambulatory Visit: Payer: Self-pay | Admitting: Obstetrics and Gynecology

## 2015-07-25 DIAGNOSIS — Z9189 Other specified personal risk factors, not elsewhere classified: Secondary | ICD-10-CM

## 2015-07-26 ENCOUNTER — Encounter: Payer: Self-pay | Admitting: Internal Medicine

## 2015-07-26 ENCOUNTER — Ambulatory Visit (INDEPENDENT_AMBULATORY_CARE_PROVIDER_SITE_OTHER): Payer: Federal, State, Local not specified - PPO | Admitting: Internal Medicine

## 2015-07-26 ENCOUNTER — Telehealth: Payer: Self-pay | Admitting: Internal Medicine

## 2015-07-26 VITALS — BP 108/76 | HR 79 | Temp 98.0°F | Resp 12 | Ht 65.0 in | Wt 156.5 lb

## 2015-07-26 DIAGNOSIS — Z1509 Genetic susceptibility to other malignant neoplasm: Secondary | ICD-10-CM

## 2015-07-26 DIAGNOSIS — E785 Hyperlipidemia, unspecified: Secondary | ICD-10-CM | POA: Diagnosis not present

## 2015-07-26 DIAGNOSIS — Z800 Family history of malignant neoplasm of digestive organs: Secondary | ICD-10-CM | POA: Diagnosis not present

## 2015-07-26 DIAGNOSIS — Z1501 Genetic susceptibility to malignant neoplasm of breast: Secondary | ICD-10-CM

## 2015-07-26 DIAGNOSIS — E559 Vitamin D deficiency, unspecified: Secondary | ICD-10-CM

## 2015-07-26 DIAGNOSIS — Z0000 Encounter for general adult medical examination without abnormal findings: Secondary | ICD-10-CM

## 2015-07-26 DIAGNOSIS — N63 Unspecified lump in unspecified breast: Secondary | ICD-10-CM

## 2015-07-26 DIAGNOSIS — IMO0002 Reserved for concepts with insufficient information to code with codable children: Secondary | ICD-10-CM

## 2015-07-26 DIAGNOSIS — Z1589 Genetic susceptibility to other disease: Secondary | ICD-10-CM

## 2015-07-26 DIAGNOSIS — R5383 Other fatigue: Secondary | ICD-10-CM

## 2015-07-26 DIAGNOSIS — D582 Other hemoglobinopathies: Secondary | ICD-10-CM

## 2015-07-26 DIAGNOSIS — Z1502 Genetic susceptibility to malignant neoplasm of ovary: Secondary | ICD-10-CM

## 2015-07-26 DIAGNOSIS — Z7289 Other problems related to lifestyle: Secondary | ICD-10-CM

## 2015-07-26 NOTE — Progress Notes (Signed)
Patient ID: Rachael Sharp, female    DOB: 11/26/1950  Age: 65 y.o. MRN: [id number]  The patient is here for annual nongyn  examination and management of other chronic and acute problems.  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 History of benign breast cysts (large)  by last ultrasound  Nov 2016 .  Due to history of  paternal  aunts with BRCA x 4 She has undergone genetic testing ,. She is positive for the CHEK 2 gene  Which estimates a  25 to 44% lifetime risk of BRCA and and 10% increased risk of colon ca.Advised to have a CBE  twice year,  And annual imaging with both MRI and mammaogram ( something every 6 months )  Wants my opinion on whether to have the cysts aspirated,    Due for Prevnar in October at 80  Sees dermatology annually .  Had Sk's removed last month from right side of neck.      Using turmeric , K2, biotin and folic acid, wanting my opinion.     The risk factors are reflected in the social history.  The roster of all physicians providing medical care to patient - is listed in the Snapshot section of the chart.  Activities of daily living:  The patient is 100% independent in all ADLs: dressing, toileting, feeding as well as independent mobility  Home safety : The patient has smoke detectors in the home. They wear seatbelts.  There are no firearms at home. There is no violence in the home.   There is no risks for hepatitis, STDs or HIV. There is no   history of blood transfusion. They have no travel history to infectious disease endemic areas of the world.  The patient has seen their dentist in the last six month. They have seen their eye doctor in the last year. They admit to slight hearing difficulty with regard to whispered voices and some television programs.  They have deferred audiologic testing in  the last year.  They do not  have excessive sun exposure. Discussed the need for sun protection: hats, long sleeves and use of sunscreen if there is significant sun exposure.   Diet: the importance of a healthy diet is discussed. They do have a healthy diet.  The benefits of regular aerobic exercise were discussed. She walks 4 times per week ,  20 minutes.   Depression screen: there are no signs or vegative symptoms of depression- irritability, change in appetite, anhedonia, sadness/tearfullness.  Cognitive assessment: the patient manages all their financial and personal affairs and is actively engaged. They could relate day,date,year and events; recalled 2/3 objects at 3 minutes; performed clock-face test normally.  The following portions of the patient's history were reviewed and updated as appropriate: allergies, current medications, past family history, past medical history,  past surgical history, past social history  and problem list.  Visual acuity was not assessed per patient preference since she has regular follow up with her ophthalmologist. Hearing and body mass index were assessed and reviewed.   During the course of the visit the patient was educated and counseled about appropriate screening and preventive services including : fall prevention , diabetes screening, nutrition counseling, colorectal cancer screening, and recommended immunizations.    CC: The primary encounter diagnosis was Family history of  colon cancer requiring screening colonoscopy. Diagnoses of Hyperlipidemia, Abnormal hemoglobin ([place]), Vitamin D deficiency, Other fatigue, Other problems related to lifestyle, Breast mass in female, Monoallelic mutation of CHEK2 gene, and Routine general medical examination at a health care facility were also pertinent to this visit.  History Rachael Sharp has a past medical history of Heart murmur and GERD (gastroesophageal reflux disease).   She has past surgical history that includes  Breast surgery (2002); Lipoma excision (1983); and Abdominal hysterectomy (August 2002).   Her family history includes COPD in her sister; Heart disease (age of onset: 77) in her sister; Stroke in her sister.She reports that she quit smoking about 25 years ago. Her smoking use included Cigarettes. She has never used smokeless tobacco. She reports that she drinks alcohol. She reports that she does not use illicit drugs.  Outpatient Prescriptions Prior to Visit  Medication Sig Dispense Refill  . amitriptyline (ELAVIL) 10 MG tablet Take 30 mg by mouth at bedtime.     Marland Kitchen estradiol (VIVELLE-DOT) 0.05 MG/24HR Apply one patch two times a week. Dosage 0.0375     No facility-administered medications prior to visit.    Review of Systems   Patient denies headache, fevers, malaise, unintentional weight loss, skin rash, eye pain, sinus congestion and sinus pain, sore throat, dysphagia,  hemoptysis , cough, dyspnea, wheezing, chest pain, palpitations, orthopnea, edema, abdominal pain, nausea, melena, diarrhea, constipation, flank pain, dysuria, hematuria, urinary  Frequency, nocturia, numbness, tingling, seizures,  Focal weakness, Loss of consciousness,  Tremor, insomnia, depression, anxiety, and suicidal ideation.      Objective:  BP 108/76 mmHg  Pulse 79  Temp(Src) 98 F (36.7 C) (Oral)  Resp 12  Ht '5\' 5"'  (1.651 m)  Wt 156 lb 8 oz (70.988 kg)  BMI 26.04 kg/m2  SpO2 98%  Physical Exam   General appearance: alert, cooperative and appears stated age Head: Normocephalic, without obvious abnormality, atraumatic Eyes: conjunctivae/corneas clear. PERRL, EOM's intact. Fundi benign. Ears: normal TM's and external ear canals both ears Nose: Nares normal. Septum midline. Mucosa normal. No drainage or sinus tenderness. Throat: lips, mucosa, and tongue normal; teeth and gums normal Neck: no adenopathy, no carotid bruit, no JVD, supple, symmetrical, trachea midline and thyroid not enlarged, symmetric, no  tenderness/mass/nodules Lungs: clear to auscultation bilaterally Breasts: normal appearance, large right sided tender breast mass regular rate and rhythm, S1, S2 normal, no murmur, click, rub or gallop Abdomen: soft, non-tender; bowel sounds normal; no masses,  no organomegaly Extremities: extremities normal, atraumatic, no cyanosis or edema Pulses: 2+ and symmetric Skin: Skin color, texture, turgor normal. No rashes or lesions Neurologic: Alert and oriented X 3, normal strength and tone. Normal symmetric reflexes. Normal coordination and gait.     Assessment & Plan:   Problem List Items Addressed This Visit    Routine general medical examination at a health care facility    Annual comprehensive preventive exam was done as well as an evaluation and management of chronic conditions .  During the course of the visit the patient was educated and counseled about appropriate screening and preventive services including :  diabetes screening, lipid analysis with projected  10 year  risk for CAD , nutrition counseling, breast, cervical and colorectal cancer screening, and recommended immunizations.  Printed recommendations for health maintenance screenings was give      Breast mass in female    Given her increased risk of BRCAS,  The presence of a large cyst , even if benign, may limit detection of breast  masses . For that reason i am recommending ultrasound guid aspiration of cyst.       Monoallelic mutation of CHEK2 gene    Recent genetic testing,  Increased risk of BRCA to lifetime to 24  to 44% , and 10% risk of colon CA.  She as been advised to have screenign done every 6 months with mammogram and breast MRI      Fatigue   Relevant Orders   Comprehensive metabolic panel   TSH    Other Visit Diagnoses    Family history of colon cancer requiring screening colonoscopy    -  Primary    Relevant Orders    Ambulatory referral to Gastroenterology    Hyperlipidemia        Relevant Orders     Lipid panel    Abnormal hemoglobin (HCC)        Relevant Orders    CBC with Differential/Platelet    B12    Folate RBC    Vitamin D deficiency        Relevant Orders    VITAMIN D 25 Hydroxy (Vit-D Deficiency, Fractures)    Other problems related to lifestyle        Relevant Orders    Hepatitis C antibody    HIV antibody       I am having Ms. Chandra maintain her amitriptyline and estradiol.  Meds ordered this encounter  Medications  . estradiol (VIVELLE-DOT) 0.025 MG/24HR    Sig: Place onto the skin.    Medications Discontinued During This Encounter  Medication Reason  . estradiol (VIVELLE-DOT) 0.05 MG/24HR Change in therapy    Follow-up: No Follow-up on file.   Crecencio Mc, MD

## 2015-07-26 NOTE — Patient Instructions (Signed)
It was good to see you.  Return for fasting labs at your convenience  Turmeric ok to take 500 mg twice daily'   I'll check on the long term effects of aspirating cysts    Arin Isenstein is the dermatologist we discussed at [facility]  Referral for colonoscopy is in progress   No need for Vitamin K2.  Folic acid 1 mg daily  Ok to take but usually not needed.  Menopause is a normal process in which your reproductive ability comes to an end. This process happens gradually over a span of months to years, usually between the ages of 50 and 17. Menopause is complete when you have missed 12 consecutive menstrual periods. It is important to talk with your health care provider about some of the most common conditions that affect postmenopausal women, such as heart disease, cancer, and bone loss (osteoporosis). Adopting a healthy lifestyle and getting preventive care can help to promote your health and wellness. Those actions can also lower your chances of developing some of these common conditions. WHAT SHOULD I KNOW ABOUT MENOPAUSE? During menopause, you may experience a number of symptoms, such as:  Moderate-to-severe hot flashes.  Night sweats.  Decrease in sex drive.  Mood swings.  Headaches.  Tiredness.  Irritability.  Memory problems.  Insomnia. Choosing to treat or not to treat menopausal changes is an individual decision that you make with your health care provider. WHAT SHOULD I KNOW ABOUT HORMONE REPLACEMENT THERAPY AND SUPPLEMENTS? Hormone therapy products are effective for treating symptoms that are associated with menopause, such as hot flashes and night sweats. Hormone replacement carries certain risks, especially as you become older. If you are thinking about using estrogen or estrogen with progestin treatments, discuss the benefits and risks with your health care provider. WHAT SHOULD I KNOW ABOUT HEART DISEASE AND STROKE? Heart disease, heart attack, and  stroke become more likely as you age. This may be due, in part, to the hormonal changes that your body experiences during menopause. These can affect how your body processes dietary fats, triglycerides, and cholesterol. Heart attack and stroke are both medical emergencies. There are many things that you can do to help prevent heart disease and stroke:  Have your blood pressure checked at least every 1-2 years. High blood pressure causes heart disease and increases the risk of stroke.  If you are 17-52 years old, ask your health care provider if you should take aspirin to prevent a heart attack or a stroke.  Do not use any tobacco products, including cigarettes, chewing tobacco, or electronic cigarettes. If you need help quitting, ask your health care provider.  It is important to eat a healthy diet and maintain a healthy weight.  Be sure to include plenty of vegetables, fruits, low-fat dairy products, and lean protein.  Avoid eating foods that are high in solid fats, added sugars, or salt (sodium).  Get regular exercise. This is one of the most important things that you can do for your health.  Try to exercise for at least 150 minutes each week. The type of exercise that you do should increase your heart rate and make you sweat. This is known as moderate-intensity exercise.  Try to do strengthening exercises at least twice each week. Do these in addition to the moderate-intensity exercise.  Know your numbers.Ask your health care provider to check your cholesterol and your blood glucose. Continue to have your blood tested as directed by your health care provider. WHAT SHOULD I KNOW  ABOUT CANCER SCREENING? There are several types of cancer. Take the following steps to reduce your risk and to catch any cancer development as early as possible. Breast Cancer  Practice breast self-awareness.  This means understanding how your breasts normally appear and feel.  It also means doing regular  breast self-exams. Let your health care provider know about any changes, no matter how small.  If you are 85 or older, have a clinician do a breast exam (clinical breast exam or CBE) every year. Depending on your age, family history, and medical history, it may be recommended that you also have a yearly breast X-ray (mammogram).  If you have a family history of breast cancer, talk with your health care provider about genetic screening.  If you are at high risk for breast cancer, talk with your health care provider about having an MRI and a mammogram every year.  Breast cancer (BRCA) gene test is recommended for women who have family members with BRCA-related cancers. Results of the assessment will determine the need for genetic counseling and BRCA1 and for BRCA2 testing. BRCA-related cancers include these types:  Breast. This occurs in males or females.  Ovarian.  Tubal. This may also be called fallopian tube cancer.  Cancer of the abdominal or pelvic lining (peritoneal cancer).  Prostate.  Pancreatic. Cervical, Uterine, and Ovarian Cancer Your health care provider may recommend that you be screened regularly for cancer of the pelvic organs. These include your ovaries, uterus, and vagina. This screening involves a pelvic exam, which includes checking for microscopic changes to the surface of your cervix (Pap test).  For women ages 21-65, health care providers may recommend a pelvic exam and a Pap test every three years. For women ages 33-65, they may recommend the Pap test and pelvic exam, combined with testing for human papilloma virus (HPV), every five years. Some types of HPV increase your risk of cervical cancer. Testing for HPV may also be done on women of any age who have unclear Pap test results.  Other health care providers may not recommend any screening for nonpregnant women who are considered low risk for pelvic cancer and have no symptoms. Ask your health care provider if a  screening pelvic exam is right for you.  If you have had past treatment for cervical cancer or a condition that could lead to cancer, you need Pap tests and screening for cancer for at least 20 years after your treatment. If Pap tests have been discontinued for you, your risk factors (such as having a new sexual partner) need to be reassessed to determine if you should start having screenings again. Some women have medical problems that increase the chance of getting cervical cancer. In these cases, your health care provider may recommend that you have screening and Pap tests more often.  If you have a family history of uterine cancer or ovarian cancer, talk with your health care provider about genetic screening.  If you have vaginal bleeding after reaching menopause, tell your health care provider.  There are currently no reliable tests available to screen for ovarian cancer. Lung Cancer Lung cancer screening is recommended for adults 21-21 years old who are at high risk for lung cancer because of a history of smoking. A yearly low-dose CT scan of the lungs is recommended if you:  Currently smoke.  Have a history of at least 30 pack-years of smoking and you currently smoke or have quit within the past 15 years. A pack-year is smoking  an average of one pack of cigarettes per day for one year. Yearly screening should:  Continue until it has been 15 years since you quit.  Stop if you develop a health problem that would prevent you from having lung cancer treatment. Colorectal Cancer  This type of cancer can be detected and can often be prevented.  Routine colorectal cancer screening usually begins at age 32 and continues through age 50.  If you have risk factors for colon cancer, your health care provider may recommend that you be screened at an earlier age.  If you have a family history of colorectal cancer, talk with your health care provider about genetic screening.  Your health care  provider may also recommend using home test kits to check for hidden blood in your stool.  A small camera at the end of a tube can be used to examine your colon directly (sigmoidoscopy or colonoscopy). This is done to check for the earliest forms of colorectal cancer.  Direct examination of the colon should be repeated every 5-10 years until age 50. However, if early forms of precancerous polyps or small growths are found or if you have a family history or genetic risk for colorectal cancer, you may need to be screened more often. Skin Cancer  Check your skin from head to toe regularly.  Monitor any moles. Be sure to tell your health care provider:  About any new moles or changes in moles, especially if there is a change in a mole's shape or color.  If you have a mole that is larger than the size of a pencil eraser.  If any of your family members has a history of skin cancer, especially at a young age, talk with your health care provider about genetic screening.  Always use sunscreen. Apply sunscreen liberally and repeatedly throughout the day.  Whenever you are outside, protect yourself by wearing long sleeves, pants, a wide-brimmed hat, and sunglasses. WHAT SHOULD I KNOW ABOUT OSTEOPOROSIS? Osteoporosis is a condition in which bone destruction happens more quickly than new bone creation. After menopause, you may be at an increased risk for osteoporosis. To help prevent osteoporosis or the bone fractures that can happen because of osteoporosis, the following is recommended:  If you are 35-78 years old, get at least 1,000 mg of calcium and at least 600 mg of vitamin D per day.  If you are older than age 37 but younger than age 71, get at least 1,200 mg of calcium and at least 600 mg of vitamin D per day.  If you are older than age 31, get at least 1,200 mg of calcium and at least 800 mg of vitamin D per day. Smoking and excessive alcohol intake increase the risk of osteoporosis. Eat foods  that are rich in calcium and vitamin D, and do weight-bearing exercises several times each week as directed by your health care provider. WHAT SHOULD I KNOW ABOUT HOW MENOPAUSE AFFECTS [place]? Depression may occur at any age, but it is more common as you become older. Common symptoms of depression include:  Low or sad mood.  Changes in sleep patterns.  Changes in appetite or eating patterns.  Feeling an overall lack of motivation or enjoyment of activities that you previously enjoyed.  Frequent crying spells. Talk with your health care provider if you think that you are experiencing depression. WHAT SHOULD I KNOW ABOUT IMMUNIZATIONS? It is important that you get and maintain your immunizations. These include:  Tetanus, diphtheria,  and pertussis (Tdap) booster vaccine.  Influenza every year before the flu season begins.  Pneumonia vaccine.  Shingles vaccine. Your health care provider may also recommend other immunizations.   This information is not intended to replace advice given to you by your health care provider. Make sure you discuss any questions you have with your health care provider.   Document Released: 06/19/2005 Document Revised: 05/18/2014 Document Reviewed: 12/28/2013 Elsevier Interactive Patient Education [employer].

## 2015-07-26 NOTE — Telephone Encounter (Signed)
Former Dr. Olevia Perches patient  Received referral on patient. Dr. Derrel Nip is requesting that patient see Dr. Hilarie Fredrickson. Is this ok? Thanks.   Patient had positive CHEK 2 genetic testing due to [place], Advised to have colonoscopy every 3 to 5 yrs, Last one 2010  Delfin Edis, If not available [facility]

## 2015-07-26 NOTE — Progress Notes (Signed)
Pre-visit discussion using our clinic review tool. No additional management support is needed unless otherwise documented below in the visit note.  

## 2015-07-27 ENCOUNTER — Encounter: Payer: Self-pay | Admitting: Internal Medicine

## 2015-07-28 DIAGNOSIS — Z1502 Genetic susceptibility to malignant neoplasm of ovary: Secondary | ICD-10-CM

## 2015-07-28 DIAGNOSIS — Z1501 Genetic susceptibility to malignant neoplasm of breast: Secondary | ICD-10-CM | POA: Insufficient documentation

## 2015-07-28 DIAGNOSIS — Z1589 Genetic susceptibility to other disease: Secondary | ICD-10-CM

## 2015-07-28 DIAGNOSIS — Z1509 Genetic susceptibility to other malignant neoplasm: Secondary | ICD-10-CM

## 2015-07-28 NOTE — Assessment & Plan Note (Signed)
Annual comprehensive preventive exam was done as well as an evaluation and management of chronic conditions .  During the course of the visit the patient was educated and counseled about appropriate screening and preventive services including :  diabetes screening, lipid analysis with projected  10 year  risk for CAD , nutrition counseling, breast, cervical and colorectal cancer screening, and recommended immunizations.  Printed recommendations for health maintenance screenings was give 

## 2015-07-28 NOTE — Assessment & Plan Note (Signed)
Given her increased risk of BRCAS,  The presence of a large cyst , even if benign, may limit detection of breast masses . For that reason i am recommending ultrasound guid aspiration of cyst.

## 2015-07-28 NOTE — Assessment & Plan Note (Signed)
Recent genetic testing,  Increased risk of BRCA to lifetime to 24  to 44% , and 10% risk of colon CA.  She as been advised to have screenign done every 6 months with mammogram and breast MRI 

## 2015-07-29 ENCOUNTER — Encounter: Payer: Self-pay | Admitting: Internal Medicine

## 2015-07-29 NOTE — Telephone Encounter (Signed)
OV scheduled with Dr. Hilarie Fredrickson

## 2015-07-29 NOTE — Telephone Encounter (Signed)
ok 

## 2015-07-30 ENCOUNTER — Other Ambulatory Visit: Payer: Federal, State, Local not specified - PPO

## 2015-08-08 ENCOUNTER — Other Ambulatory Visit (INDEPENDENT_AMBULATORY_CARE_PROVIDER_SITE_OTHER): Payer: Federal, State, Local not specified - PPO

## 2015-08-08 DIAGNOSIS — R5383 Other fatigue: Secondary | ICD-10-CM

## 2015-08-08 DIAGNOSIS — E559 Vitamin D deficiency, unspecified: Secondary | ICD-10-CM

## 2015-08-08 DIAGNOSIS — D582 Other hemoglobinopathies: Secondary | ICD-10-CM | POA: Diagnosis not present

## 2015-08-08 DIAGNOSIS — E785 Hyperlipidemia, unspecified: Secondary | ICD-10-CM

## 2015-08-08 DIAGNOSIS — Z7289 Other problems related to lifestyle: Secondary | ICD-10-CM

## 2015-08-08 LAB — COMPREHENSIVE METABOLIC PANEL
ALBUMIN: 4.5 g/dL (ref 3.5–5.2)
ALT: 26 U/L (ref 0–35)
AST: 26 U/L (ref 0–37)
Alkaline Phosphatase: 45 U/L (ref 39–117)
BILIRUBIN TOTAL: 0.3 mg/dL (ref 0.2–1.2)
BUN: 17 mg/dL (ref 6–23)
CO2: 27 meq/L (ref 19–32)
CREATININE: 0.95 mg/dL (ref 0.40–1.20)
Calcium: 9.8 mg/dL (ref 8.4–10.5)
Chloride: 102 mEq/L (ref 96–112)
GFR: 62.84 mL/min (ref >60.00)
Glucose, Bld: 113 mg/dL — ABNORMAL HIGH (ref 70–99)
Potassium: 4.9 mEq/L (ref 3.5–5.1)
Sodium: 136 mEq/L (ref 135–145)
Total Protein: 7.2 g/dL (ref 6.0–8.3)

## 2015-08-08 LAB — CBC WITH DIFFERENTIAL/PLATELET
BASOS ABS: 0 10*3/uL (ref 0.0–0.1)
Basophils Relative: 0.7 % (ref 0.0–3.0)
Eosinophils Absolute: 0.1 10*3/uL (ref 0.0–0.7)
Eosinophils Relative: 1.4 % (ref 0.0–5.0)
HCT: 46.4 % — ABNORMAL HIGH (ref 36.0–46.0)
Hemoglobin: 15.5 g/dL — ABNORMAL HIGH (ref 12.0–15.0)
LYMPHS ABS: 2.6 10*3/uL (ref 0.7–4.0)
Lymphocytes Relative: 51.1 % — ABNORMAL HIGH (ref 12.0–46.0)
MCHC: 33.4 g/dL (ref 30.0–36.0)
MCV: 90.7 fl (ref 78.0–100.0)
MONO ABS: 0.5 10*3/uL (ref 0.1–1.0)
Monocytes Relative: 9.8 % (ref 3.0–12.0)
NEUTROS ABS: 1.9 10*3/uL (ref 1.4–7.7)
NEUTROS PCT: 37 % — AB (ref 43.0–77.0)
PLATELETS: 293 10*3/uL (ref 150.0–400.0)
RBC: 5.11 Mil/uL (ref 3.87–5.11)
RDW: 14.7 % (ref 11.5–15.5)
WBC: 5.1 10*3/uL (ref 4.0–10.5)

## 2015-08-08 LAB — LIPID PANEL
Cholesterol: 194 mg/dL (ref 0–200)
HDL: 65 mg/dL (ref >39.00)
LDL Cholesterol: 108 mg/dL — ABNORMAL HIGH (ref 0–99)
NonHDL: 128.51
TRIGLYCERIDES: 102 mg/dL (ref 0.0–149.0)
Total CHOL/HDL Ratio: 3
VLDL: 20.4 mg/dL (ref 0.0–40.0)

## 2015-08-08 LAB — TSH: TSH: 2.97 u[IU]/mL (ref 0.35–4.50)

## 2015-08-08 LAB — VITAMIN D 25 HYDROXY (VIT D DEFICIENCY, FRACTURES): VITD: 27.12 ng/mL — ABNORMAL LOW (ref 30.00–100.00)

## 2015-08-08 LAB — VITAMIN B12: Vitamin B-12: 339 pg/mL (ref 211–911)

## 2015-08-09 LAB — HIV ANTIBODY (ROUTINE TESTING W REFLEX): HIV 1&2 Ab, 4th Generation: NONREACTIVE

## 2015-08-09 LAB — HEPATITIS C ANTIBODY: HCV AB: NEGATIVE

## 2015-08-09 LAB — FOLATE RBC: RBC FOLATE: 456 ng/mL (ref >280)

## 2015-08-10 ENCOUNTER — Encounter: Payer: Self-pay | Admitting: Internal Medicine

## 2015-08-11 ENCOUNTER — Encounter: Payer: Self-pay | Admitting: Internal Medicine

## 2015-08-13 NOTE — Telephone Encounter (Signed)
Tried to contact patient by phone to answer question and concerns no answer left voicemail.

## 2015-08-20 ENCOUNTER — Telehealth: Payer: Self-pay | Admitting: *Deleted

## 2015-08-20 NOTE — Telephone Encounter (Signed)
Patient requested a call in reference to labs on 08/08/15 Pt Contact [phone]

## 2015-08-29 ENCOUNTER — Encounter: Payer: Self-pay | Admitting: Internal Medicine

## 2015-09-12 ENCOUNTER — Encounter: Payer: Self-pay | Admitting: Internal Medicine

## 2015-09-18 ENCOUNTER — Ambulatory Visit
Admission: RE | Admit: 2015-09-18 | Discharge: 2015-09-18 | Disposition: A | Discharge status: Home / Self Care | Payer: Federal, State, Local not specified - PPO | Source: Ambulatory Visit

## 2015-09-18 DIAGNOSIS — Z1231 Encounter for screening mammogram for malignant neoplasm of breast: Secondary | ICD-10-CM

## 2015-09-19 ENCOUNTER — Encounter: Payer: Self-pay | Admitting: *Deleted

## 2015-10-04 ENCOUNTER — Encounter: Payer: Self-pay | Admitting: Internal Medicine

## 2015-10-04 ENCOUNTER — Ambulatory Visit (INDEPENDENT_AMBULATORY_CARE_PROVIDER_SITE_OTHER): Payer: Federal, State, Local not specified - PPO | Admitting: Internal Medicine

## 2015-10-04 ENCOUNTER — Ambulatory Visit: Payer: Federal, State, Local not specified - PPO | Admitting: Internal Medicine

## 2015-10-04 VITALS — BP 108/64 | HR 65 | Ht 65.0 in | Wt 155.0 lb

## 2015-10-04 DIAGNOSIS — IMO0002 Reserved for concepts with insufficient information to code with codable children: Secondary | ICD-10-CM

## 2015-10-04 DIAGNOSIS — Z1501 Genetic susceptibility to malignant neoplasm of breast: Secondary | ICD-10-CM

## 2015-10-04 DIAGNOSIS — Z1502 Genetic susceptibility to malignant neoplasm of ovary: Secondary | ICD-10-CM | POA: Diagnosis not present

## 2015-10-04 DIAGNOSIS — Z1589 Genetic susceptibility to other disease: Secondary | ICD-10-CM | POA: Diagnosis not present

## 2015-10-04 DIAGNOSIS — Z1211 Encounter for screening for malignant neoplasm of colon: Secondary | ICD-10-CM

## 2015-10-04 DIAGNOSIS — Z1509 Genetic susceptibility to other malignant neoplasm: Secondary | ICD-10-CM

## 2015-10-04 MED ORDER — NA SULFATE-K SULFATE-MG SULF 17.5-3.13-1.6 GM/177ML PO SOLN: ORAL | Status: DC

## 2015-10-04 NOTE — Progress Notes (Signed)
Patient ID: Rachael Sharp, female   DOB: [birth year], 65 y.o.   MRN: [id number] HPI: Rachael Sharp is a 65 year old female with a past medical history of mitral bowel prolapse, vitamin D deficiency, hyperlipidemia and CHEK2 gene mutation who is seen in consultation at the request of Dr. Derrel Nip to consider elevated risk colorectal cancer screening. She is here alone today. She reports that she has been feeling well. She denies GI complaint. Specifically no change in bowel habit, diarrhea or constipation. No blood in her stool. No abdominal pain. She has very rare heartburn, less than once per month which is minor. No dysphagia or odynophagia. No nausea or vomiting. Stable weight and good appetite. She last had a colonoscopy on 07/19/2008 by Dr. Delfin Edis. This revealed a diminutive rectal polyp which was removed and found to be hyperplastic. The terminal ileum was normal. There was suggestion of a possible ascending colon stricture but this was subtle. Biopsies were unremarkable.  She has a strong family history of breast cancer in 4 aunts on her father's side. Genetic testing was negative for BRCA gene but positive for CHEK2 gene.  She is undergoing elevated risk breast cancer screening with digital mammography alternating with breast MRI. This gene confers an increased lifetime risk of colorectal cancer approximately 10%.  Past Medical History  Diagnosis Date  . Heart murmur     mitral valve prolapse  . GERD (gastroesophageal reflux disease)   . Hyperplastic colon polyp   . Colon stricture ([place])   . Hyperlipidemia   . Vitamin D deficiency     Past Surgical History  Procedure Laterality Date  . Breast surgery  2002    biopsy x 2 , 1976  . Lipoma excision  1983  . Abdominal hysterectomy  August 2002  . Tubal ligation  1982  . Tonsillectomy  1964    Outpatient Prescriptions Prior to Visit  Medication Sig Dispense Refill  . amitriptyline (ELAVIL) 10 MG tablet Take 30 mg by mouth  at bedtime.     Marland Kitchen estradiol (VIVELLE-DOT) 0.025 MG/24HR Place onto the skin.     No facility-administered medications prior to visit.    Allergies  Allergen Reactions  . Penicillins     REACTION: rash  . Epinephrine Palpitations    Family History  Problem Relation Age of Onset  . COPD Sister   . Heart disease Sister 77    massive CVA,    . Stroke Sister     Social History  Substance Use Topics  . Smoking status: Former Smoker    Types: Cigarettes    Quit date: 08/31/1989  . Smokeless tobacco: Never Used     Comment: Quit 1991  . Alcohol Use: 0.0 oz/week    0 Standard drinks or equivalent per week    ROS: As per history of present illness, otherwise negative  BP 108/64 mmHg  Pulse 65  Ht _0  (1.651 m)  Wt 155 lb (70.308 kg)  BMI 25.79 kg/m2 Constitutional: Well-developed and well-nourished. No distress. HEENT: Normocephalic and atraumatic. Oropharynx is clear and moist. No oropharyngeal exudate. Conjunctivae are normal.  No scleral icterus. Neck: Neck supple. Trachea midline. Cardiovascular: Normal rate, regular rhythm and intact distal pulses. No M/R/G Pulmonary/chest: Effort normal and breath sounds normal. No wheezing, rales or rhonchi. Abdominal: Soft, nontender, nondistended. Bowel sounds active throughout. There are no masses palpable. No hepatosplenomegaly. Extremities: no clubbing, cyanosis, or edema Lymphadenopathy: No cervical adenopathy noted. Neurological: Alert and oriented to person place and time. Skin:  Skin is warm and dry. No rashes noted. Psychiatric: Normal mood and affect. Behavior is normal.  RELEVANT LABS AND IMAGING: CBC    Component Value Date/Time   WBC 5.1 08/08/2015 1027   WBC 5.5 01/30/2013 1349   RBC 5.11 08/08/2015 1027   RBC 4.64 01/30/2013 1349   HGB 15.5* 08/08/2015 1027   HGB 14.4 01/30/2013 1349   HCT 46.4* 08/08/2015 1027   HCT 42.9 01/30/2013 1349   PLT 293.0 08/08/2015 1027   PLT 267 01/30/2013 1349   MCV 90.7  08/08/2015 1027   MCV 92 01/30/2013 1349   MCH 31.0 01/30/2013 1349   MCHC 33.4 08/08/2015 1027   MCHC 33.6 01/30/2013 1349   RDW 14.7 08/08/2015 1027   RDW 13.9 01/30/2013 1349   LYMPHSABS 2.6 08/08/2015 1027   LYMPHSABS 2.2 01/30/2013 1349   MONOABS 0.5 08/08/2015 1027   MONOABS 0.4 01/30/2013 1349   EOSABS 0.1 08/08/2015 1027   EOSABS 0.1 01/30/2013 1349   BASOSABS 0.0 08/08/2015 1027   BASOSABS 0.0 01/30/2013 1349    CMP     Component Value Date/Time   NA 136 08/08/2015 1027   K 4.9 08/08/2015 1027   CL 102 08/08/2015 1027   CO2 27 08/08/2015 1027   GLUCOSE 113* 08/08/2015 1027   BUN 17 08/08/2015 1027   CREATININE 0.95 08/08/2015 1027   CALCIUM 9.8 08/08/2015 1027   PROT 7.2 08/08/2015 1027   ALBUMIN 4.5 08/08/2015 1027   AST 26 08/08/2015 1027   ALT 26 08/08/2015 1027   ALKPHOS 45 08/08/2015 1027   BILITOT 0.3 08/08/2015 1027    ASSESSMENT/PLAN: 65 year old female with a past medical history of mitral bowel prolapse, vitamin D deficiency, hyperlipidemia and CHEK2 gene mutation who is seen in consultation at the request of Dr. Derrel Nip to consider elevated risk colorectal cancer screening.  1. Elevated risk CRC screening -- CHEK2 mutation can increase the risk of colorectal cancer. Fortunately there are no family members who have had colorectal cancer to this point. I recommended repeat, elevated risk colorectal cancer screening at this time. We discussed the risks, benefits and alternatives and she is agreeable to proceed. We likely will maintain a 3 year surveillance interval unless further/future data suggests a different interval going forward. She had questions regarding the sedation. A prior uterine surgery was performed when she was not fully asleep and this led to considerable anxiety and PTSD. We discussed propofol sedation which I think she will do well with.    FX:OVANVB Ether Griffins, Md [address] [place], [place] [address]

## 2015-10-04 NOTE — Patient Instructions (Signed)
You have been scheduled for a colonoscopy. Please follow written instructions given to you at your visit today.  Please pick up your prep supplies at the pharmacy within the next 1-3 days. If you use inhalers (even only as needed), please bring them with you on the day of your procedure. Your physician has requested that you go to www.startemmi.com and enter the access code given to you at your visit today. This web site gives a general overview about your procedure. However, you should still follow specific instructions given to you by our office regarding your preparation for the procedure.  If you are age 12 or older, your body mass index should be between 23-30. Your Body mass index is 25.79 kg/(m^2). If this is out of the aforementioned range listed, please consider follow up with your Primary Care Provider.  If you are age 55 or younger, your body mass index should be between 19-25. Your Body mass index is 25.79 kg/(m^2). If this is out of the aformentioned range listed, please consider follow up with your Primary Care Provider.

## 2015-10-10 ENCOUNTER — Ambulatory Visit: Payer: Federal, State, Local not specified - PPO | Admitting: Family Medicine

## 2015-10-17 ENCOUNTER — Encounter: Payer: Self-pay | Admitting: Internal Medicine

## 2015-10-17 ENCOUNTER — Ambulatory Visit (AMBULATORY_SURGERY_CENTER): Payer: Federal, State, Local not specified - PPO | Admitting: Internal Medicine

## 2015-10-17 VITALS — BP 112/67 | HR 66 | Temp 97.3°F | Resp 15 | Ht 65.0 in | Wt 155.0 lb

## 2015-10-17 DIAGNOSIS — K635 Polyp of colon: Secondary | ICD-10-CM | POA: Diagnosis not present

## 2015-10-17 DIAGNOSIS — Z1211 Encounter for screening for malignant neoplasm of colon: Secondary | ICD-10-CM | POA: Diagnosis not present

## 2015-10-17 DIAGNOSIS — D122 Benign neoplasm of ascending colon: Secondary | ICD-10-CM | POA: Diagnosis not present

## 2015-10-17 MED ORDER — SODIUM CHLORIDE 0.9 % IV SOLN: 500.0000 mL | INTRAVENOUS | Status: DC

## 2015-10-17 MED ORDER — SODIUM CHLORIDE 0.9 % IV SOLN
500.0000 mL | INTRAVENOUS | Status: DC
Start: 2015-10-17 — End: 2015-10-17

## 2015-10-17 NOTE — Progress Notes (Signed)
A/ox3 pleased with MAC, report to Tracy RN 

## 2015-10-17 NOTE — Progress Notes (Signed)
Called to room to assist during endoscopic procedure.  Patient ID and intended procedure confirmed with present staff. Received instructions for my participation in the procedure from the performing physician.  

## 2015-10-17 NOTE — Op Note (Signed)
[facility] Patient Name: Rachael Sharp Procedure Date: 10/17/2015 11:05 AM MRN: [id number] Endoscopist: Jerene Bears , MD Age: 65 Referring MD:  Date of Birth: 23-Feb-1951 Gender: Female Procedure:                Colonoscopy Indications:              Screening for colorectal malignant neoplasm, Last                            colonoscopy: March 2010; patient with history of                            CHEK2 gene mutation raising the risking of colonic                            neoplasm Medicines:                Monitored Anesthesia Care Procedure:                Pre-Anesthesia Assessment:                           - Prior to the procedure, a History and Physical                            was performed, and patient medications and                            allergies were reviewed. The patient's tolerance of                            previous anesthesia was also reviewed. The risks                            and benefits of the procedure and the sedation                            options and risks were discussed with the patient.                            All questions were answered, and informed consent                            was obtained. Prior Anticoagulants: The patient has                            taken no previous anticoagulant or antiplatelet                            agents. ASA Grade Assessment: II - A patient with                            mild systemic disease. After reviewing the risks  and benefits, the patient was deemed in                            satisfactory condition to undergo the procedure.                           After obtaining informed consent, the colonoscope                            was passed under direct vision. Throughout the                            procedure, the patient's blood pressure, pulse, and                            oxygen saturations were monitored continuously. The          Model PCF-H190DL [id number]) scope was introduced                            through the anus and advanced to the the terminal                            ileum. The colonoscopy was performed without                            difficulty. The patient tolerated the procedure                            well. The quality of the bowel preparation was                            excellent. The terminal ileum, ileocecal valve,                            appendiceal orifice, and rectum were photographed. Scope In: 11:18:56 AM Scope Out: 11:34:04 AM Scope Withdrawal Time: 0 hours 11 minutes 7 seconds  Total Procedure Duration: 0 hours 15 minutes 8 seconds  Findings:                 The digital rectal exam was normal.                           The terminal ileum appeared normal.                           A 3 mm polyp was found in the ascending colon. The                            polyp was sessile. The polyp was removed with a                            cold snare. Resection and retrieval were complete.  Estimated blood loss: none.                           The exam was otherwise without abnormality on                            direct and retroflexion views. Complications:            No immediate complications. Estimated Blood Loss:     Estimated blood loss: none. Impression:               - The examined portion of the ileum was normal.                           - One 3 mm polyp in the ascending colon, removed                            with a cold snare. Resected and retrieved.                           - The examination was otherwise normal on direct                            and retroflexion views. Recommendation:           - Patient has a contact number available for                            emergencies. The signs and symptoms of potential                            delayed complications were discussed with the                            patient. Return to  normal activities tomorrow.                            Written discharge instructions were provided to the                            patient.                           - Resume previous diet.                           - Continue present medications.                           - Repeat colonoscopy in 3 years for surveillance                            given known genetic mutation raising the risk of                            colon cancer. Jerene Bears, MD 10/17/2015 11:40:45 AM This  report has been signed electronically.

## 2015-10-17 NOTE — Patient Instructions (Signed)
Resume previous diet, continue present medications, repeat colonoscopy depending on pathology results.  YOU HAD AN ENDOSCOPIC PROCEDURE TODAY AT [facility]:   Refer to the procedure report that was given to you for any specific questions about what was found during the examination.  If the procedure report does not answer your questions, please call your gastroenterologist to clarify.  If you requested that your care partner not be given the details of your procedure findings, then the procedure report has been included in a sealed envelope for you to review at your convenience later.  YOU SHOULD EXPECT: Some feelings of bloating in the abdomen. Passage of more gas than usual.  Walking can help get rid of the air that was put into your GI tract during the procedure and reduce the bloating. If you had a lower endoscopy (such as a colonoscopy or flexible sigmoidoscopy) you may notice spotting of blood in your stool or on the toilet paper. If you underwent a bowel prep for your procedure, you may not have a normal bowel movement for a few days.  Please Note:  You might notice some irritation and congestion in your nose or some drainage.  This is from the oxygen used during your procedure.  There is no need for concern and it should clear up in a day or so.  SYMPTOMS TO REPORT IMMEDIATELY:   Following lower endoscopy (colonoscopy or flexible sigmoidoscopy):  Excessive amounts of blood in the stool  Significant tenderness or worsening of abdominal pains  Swelling of the abdomen that is new, acute  Fever of 100F or higher   Following upper endoscopy (EGD)  Vomiting of blood or coffee ground material  New chest pain or pain under the shoulder blades  Painful or persistently difficult swallowing  New shortness of breath  Fever of 100F or higher  Black, tarry-looking stools  For urgent or emergent issues, a gastroenterologist can be reached at any hour by calling [phone]  [phone].   DIET: Your first meal following the procedure should be a small meal and then it is ok to progress to your normal diet. Heavy or fried foods are harder to digest and may make you feel nauseous or bloated.  Likewise, meals heavy in dairy and vegetables can increase bloating.  Drink plenty of fluids but you should avoid alcoholic beverages for 24 hours.  ACTIVITY:  You should plan to take it easy for the rest of today and you should NOT DRIVE or use heavy machinery until tomorrow (because of the sedation medicines used during the test).    FOLLOW UP: Our staff will call the number listed on your records the next business day following your procedure to check on you and address any questions or concerns that you may have regarding the information given to you following your procedure. If we do not reach you, we will leave a message.  However, if you are feeling well and you are not experiencing any problems, there is no need to return our call.  We will assume that you have returned to your regular daily activities without incident.  If any biopsies were taken you will be contacted by phone or by letter within the next 1-3 weeks.  Please call us at [phone] if you have not heard about the biopsies in 3 weeks.    SIGNATURES/CONFIDENTIALITY: You and/or your care partner have signed paperwork which will be entered into your electronic medical record.  These signatures attest to the fact  that that the information above on your After Visit Summary has been reviewed and is understood.  Full responsibility of the confidentiality of this discharge information lies with you and/or your care-partner.

## 2015-10-18 ENCOUNTER — Telehealth: Payer: Self-pay | Admitting: *Deleted

## 2015-10-18 NOTE — Telephone Encounter (Signed)
  Follow up Call-  Call back number 10/17/2015  Post procedure Call Back phone  # [phone]  Permission to leave phone message Yes     Left message on number given in admitting that identifies pt by name to return call if she has questions or concerns, Lenard Galloway RN

## 2015-10-23 ENCOUNTER — Encounter: Payer: Self-pay | Admitting: Internal Medicine

## 2015-12-20 ENCOUNTER — Encounter: Payer: Self-pay | Admitting: Internal Medicine

## 2015-12-20 ENCOUNTER — Telehealth: Payer: Self-pay | Admitting: *Deleted

## 2015-12-20 DIAGNOSIS — H4312 Vitreous hemorrhage, left eye: Secondary | ICD-10-CM

## 2015-12-20 NOTE — Telephone Encounter (Signed)
Please advise 

## 2015-12-20 NOTE — Telephone Encounter (Signed)
Patient called and states she was out of town and developed and issue with her eye. She seen a opthalmologist who recommend she see a retina specialist on Monday. Patient states she needs a referral to this specialist . Patient is requesting a call back . Her number is  [phone]. Please advise provider. Thanks

## 2015-12-20 NOTE — Telephone Encounter (Signed)
Usually the ophthalmologist will make the referral.  Does she have a name?

## 2015-12-20 NOTE — Telephone Encounter (Signed)
Unable to leave voicemail.

## 2015-12-23 ENCOUNTER — Telehealth: Payer: Self-pay | Admitting: Internal Medicine

## 2015-12-23 NOTE — Telephone Encounter (Signed)
Pt advised on voicemail-aa 

## 2015-12-23 NOTE — Telephone Encounter (Signed)
patient was treated while out of town for sudden loss of vision lefteye on Aug 11, told she had a vitreal hemorrhage and needed to see a retinal specialist ASAP.  Order placed. Urgent.  Wants [place] or [place] provider.  Thank you TT

## 2015-12-23 NOTE — Telephone Encounter (Signed)
Your urgent  referral is in process as requested to a retina specialist in either [place] or at [facility] . Our referral coordinator will call you when the appointment has been made.

## 2015-12-25 ENCOUNTER — Telehealth: Payer: Self-pay | Admitting: Internal Medicine

## 2015-12-25 ENCOUNTER — Encounter: Payer: Self-pay | Admitting: Internal Medicine

## 2015-12-25 LAB — HM PAP SMEAR

## 2015-12-25 NOTE — Telephone Encounter (Signed)
Patient informed me that she has already sent another message and has brought paperwork that was needed.  She will be awaiting a reply from someone else. Patient was very rude on the phone and hung up when she was flustered.

## 2015-12-25 NOTE — Telephone Encounter (Signed)
Please advise patient last OV was 07/26/15 with primary.

## 2015-12-25 NOTE — Telephone Encounter (Signed)
Patient has been informed.

## 2015-12-25 NOTE — Telephone Encounter (Signed)
Rachael Sharp please advise, thanks

## 2015-12-25 NOTE — Telephone Encounter (Signed)
Pt was sent a Mychart message for health mainteance. Pt stated that she does not get Pap smears she had a hysterectomy. Thank you!

## 2015-12-26 ENCOUNTER — Other Ambulatory Visit: Payer: Self-pay | Admitting: Internal Medicine

## 2015-12-26 MED ORDER — DIAZEPAM 10 MG PO TABS: 10.0000 mg | ORAL_TABLET | Freq: Four times a day (QID) | ORAL | 0 refills | Status: DC | PRN

## 2015-12-27 ENCOUNTER — Telehealth: Payer: Self-pay | Admitting: *Deleted

## 2015-12-27 NOTE — Telephone Encounter (Signed)
It was refilled and faxed yesterday.

## 2015-12-27 NOTE — Telephone Encounter (Signed)
Patient called and states that she has not received a message from her pharmacy that her RX for Valium was sent to the pharmacy. Patient states she needs the RX for a procedure on Monday. Patient is requesting a call back once the RX is faxed back to the pharmacy. Her call back number is [phone]. Thanks

## 2016-01-20 DIAGNOSIS — H33322 Round hole, left eye: Secondary | ICD-10-CM | POA: Diagnosis not present

## 2016-01-21 ENCOUNTER — Telehealth: Payer: Self-pay | Admitting: Internal Medicine

## 2016-01-21 NOTE — Telephone Encounter (Signed)
Ok by me.  Forwarding to our office manager FYI

## 2016-01-21 NOTE — Telephone Encounter (Signed)
Pt scheduled 07/12  Pt aware

## 2016-01-21 NOTE — Telephone Encounter (Signed)
Pt would like to change from Dr Derrel Nip to Dr Diona Browner due to the office staff. Please let me know if this is ok. She is aware that the wait for Dr Diona Browner is long.

## 2016-01-21 NOTE — Telephone Encounter (Signed)
Okay with me 

## 2016-01-28 NOTE — Telephone Encounter (Signed)
Attempted to call patient as requested by PCP.  No answer and no voice mail.  Will call again to follow up.

## 2016-03-19 DIAGNOSIS — L538 Other specified erythematous conditions: Secondary | ICD-10-CM | POA: Diagnosis not present

## 2016-03-19 DIAGNOSIS — L820 Inflamed seborrheic keratosis: Secondary | ICD-10-CM | POA: Diagnosis not present

## 2016-03-23 ENCOUNTER — Ambulatory Visit
Admission: RE | Admit: 2016-03-23 | Discharge: 2016-03-23 | Disposition: A | Discharge status: Home / Self Care | Payer: Medicare Other | Source: Ambulatory Visit | Attending: Obstetrics and Gynecology | Admitting: Obstetrics and Gynecology

## 2016-03-23 DIAGNOSIS — Z9189 Other specified personal risk factors, not elsewhere classified: Secondary | ICD-10-CM

## 2016-03-23 DIAGNOSIS — N6011 Diffuse cystic mastopathy of right breast: Secondary | ICD-10-CM | POA: Diagnosis not present

## 2016-03-23 MED ORDER — GADOBENATE DIMEGLUMINE 529 MG/ML IV SOLN
15.0000 mL | Freq: Once | INTRAVENOUS | Status: AC | PRN
  Administered 2016-03-23: 15 mL via INTRAVENOUS

## 2016-03-23 MED ORDER — GADOBENATE DIMEGLUMINE 529 MG/ML IV SOLN: 15.0000 mL | Freq: Once | INTRAVENOUS | Status: DC | PRN

## 2016-04-09 ENCOUNTER — Other Ambulatory Visit: Payer: Self-pay | Admitting: Obstetrics and Gynecology

## 2016-04-09 DIAGNOSIS — Z1231 Encounter for screening mammogram for malignant neoplasm of breast: Secondary | ICD-10-CM

## 2016-04-15 DIAGNOSIS — N6001 Solitary cyst of right breast: Secondary | ICD-10-CM | POA: Diagnosis not present

## 2016-04-15 DIAGNOSIS — Z9189 Other specified personal risk factors, not elsewhere classified: Secondary | ICD-10-CM | POA: Diagnosis not present

## 2016-04-15 DIAGNOSIS — N94819 Vulvodynia, unspecified: Secondary | ICD-10-CM | POA: Diagnosis not present

## 2016-04-15 DIAGNOSIS — N952 Postmenopausal atrophic vaginitis: Secondary | ICD-10-CM | POA: Diagnosis not present

## 2016-04-15 DIAGNOSIS — Z01419 Encounter for gynecological examination (general) (routine) without abnormal findings: Secondary | ICD-10-CM | POA: Diagnosis not present

## 2016-04-16 ENCOUNTER — Other Ambulatory Visit: Payer: Self-pay | Admitting: Obstetrics and Gynecology

## 2016-04-16 DIAGNOSIS — N6001 Solitary cyst of right breast: Secondary | ICD-10-CM

## 2016-04-20 ENCOUNTER — Other Ambulatory Visit: Payer: Self-pay | Admitting: Obstetrics and Gynecology

## 2016-04-20 DIAGNOSIS — N6310 Unspecified lump in the right breast, unspecified quadrant: Secondary | ICD-10-CM

## 2016-04-22 ENCOUNTER — Other Ambulatory Visit: Payer: Medicare Other

## 2016-09-22 ENCOUNTER — Ambulatory Visit
Admission: RE | Admit: 2016-09-22 | Discharge: 2016-09-22 | Disposition: A | Discharge status: Home / Self Care | Payer: Medicare Other | Source: Ambulatory Visit | Attending: Obstetrics and Gynecology | Admitting: Obstetrics and Gynecology

## 2016-09-22 DIAGNOSIS — Z1231 Encounter for screening mammogram for malignant neoplasm of breast: Secondary | ICD-10-CM | POA: Diagnosis not present

## 2016-10-06 ENCOUNTER — Other Ambulatory Visit: Payer: Self-pay | Admitting: Obstetrics and Gynecology

## 2016-10-06 DIAGNOSIS — Z9189 Other specified personal risk factors, not elsewhere classified: Secondary | ICD-10-CM

## 2016-10-30 DIAGNOSIS — L538 Other specified erythematous conditions: Secondary | ICD-10-CM | POA: Diagnosis not present

## 2016-10-30 DIAGNOSIS — L298 Other pruritus: Secondary | ICD-10-CM | POA: Diagnosis not present

## 2016-10-30 DIAGNOSIS — B351 Tinea unguium: Secondary | ICD-10-CM | POA: Diagnosis not present

## 2016-10-30 DIAGNOSIS — L820 Inflamed seborrheic keratosis: Secondary | ICD-10-CM | POA: Diagnosis not present

## 2016-10-30 DIAGNOSIS — L821 Other seborrheic keratosis: Secondary | ICD-10-CM | POA: Diagnosis not present

## 2016-11-18 ENCOUNTER — Encounter: Payer: Self-pay | Admitting: *Deleted

## 2016-11-19 ENCOUNTER — Encounter: Payer: Self-pay | Admitting: Family Medicine

## 2016-11-19 ENCOUNTER — Ambulatory Visit (INDEPENDENT_AMBULATORY_CARE_PROVIDER_SITE_OTHER): Payer: Medicare Other | Admitting: Family Medicine

## 2016-11-19 DIAGNOSIS — M7061 Trochanteric bursitis, right hip: Secondary | ICD-10-CM | POA: Diagnosis not present

## 2016-11-19 DIAGNOSIS — N951 Menopausal and female climacteric states: Secondary | ICD-10-CM | POA: Diagnosis not present

## 2016-11-19 DIAGNOSIS — I341 Nonrheumatic mitral (valve) prolapse: Secondary | ICD-10-CM

## 2016-11-19 DIAGNOSIS — D751 Secondary polycythemia: Secondary | ICD-10-CM | POA: Diagnosis not present

## 2016-11-19 DIAGNOSIS — M7071 Other bursitis of hip, right hip: Secondary | ICD-10-CM | POA: Insufficient documentation

## 2016-11-19 DIAGNOSIS — N904 Leukoplakia of vulva: Secondary | ICD-10-CM | POA: Diagnosis not present

## 2016-11-19 DIAGNOSIS — M7072 Other bursitis of hip, left hip: Secondary | ICD-10-CM

## 2016-11-19 DIAGNOSIS — M7062 Trochanteric bursitis, left hip: Secondary | ICD-10-CM

## 2016-11-19 MED ORDER — DICLOFENAC SODIUM 75 MG PO TBEC: 75.0000 mg | DELAYED_RELEASE_TABLET | Freq: Two times a day (BID) | ORAL | 0 refills | Status: DC

## 2016-11-19 NOTE — Assessment & Plan Note (Signed)
Discussed risk of CVD/breast cancer associated with  Estrogen. She is on lowest dose possible. She is not interested in stopping.

## 2016-11-19 NOTE — Patient Instructions (Signed)
Start 2 week course of diclofenac for trochanteric bursitis.  Take with food.

## 2016-11-19 NOTE — Progress Notes (Signed)
 Subjective:    Patient ID: Rachael Sharp, female    DOB: 03/30/1951, 65 y.o.   MRN: [id number]  HPI   65 year old female presents to establish care/ transfer. He previous MD was   Last CPX:  09/2015 Last GYN exam: GYN exam was done  by Dr Bland at [facility] due to history of vulvar Dystrophy, BSO at age 50  She is estradiol for menopausal symptoms.  Chronic bursitis bilateral hips:  She has issues for 4 years. Pain is daily... Better if  Less active.  She has been having a lot of pain with this, keeping her up at night with pain.  She has tried anti-inflammatory. She has never had injections in hips.    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 Pain controlled with amitriptyline.   She is currently taking fluconazole x 3 month for foot fungus.  She is using tretinoin for skin aging at bedtime.     History of breast cysts 2016, hx of BRCA in paternal aunts. She has undergone genetic testing ,. She is positive for the CHEK 2 gene  Which estimates a  25 to 44% lifetime risk of BRCA and and 10% increased risk of colon ca. Advised to have a CBE  twice year,  And annual imaging with both MRI and mammogram (something every 6 months)   Colonoscopy every 3 years. Dr Pyrtle    Former smoker  Married with step daughter  retired exec assistant  Review of Systems  Constitutional: Negative for fatigue.  HENT: Negative for ear pain.   Eyes: Negative for pain.  Respiratory: Negative for cough and shortness of breath.   Cardiovascular: Negative for chest pain.  Gastrointestinal: Negative for abdominal distention and blood in stool.  Musculoskeletal: Negative for back pain.        alopecia  Psychiatric/Behavioral: Negative for dysphoric mood.       Objective:   Physical Exam  Constitutional: Vital signs are normal. She appears well-developed and well-nourished. She is cooperative.  Non-toxic appearance. She does not appear ill. No distress.  HENT:  Head: Normocephalic.  Right Ear:  Hearing, tympanic membrane, external ear and ear canal normal.  Left Ear: Hearing, tympanic membrane, external ear and ear canal normal.  Nose: Nose normal.  Eyes: Pupils are equal, round, and reactive to light. Conjunctivae, EOM and lids are normal. Lids are everted and swept, no foreign bodies found.  Neck: Trachea normal and normal range of motion. Neck supple. Carotid bruit is not present. No thyroid mass and no thyromegaly present.  Cardiovascular: Normal rate, regular rhythm, S1 normal, S2 normal, normal heart sounds and intact distal pulses.  Exam reveals no gallop.   No murmur heard. Pulmonary/Chest: Effort normal and breath sounds normal. No respiratory distress. She has no wheezes. She has no rhonchi. She has no rales.  Abdominal: Soft. Normal appearance and bowel sounds are normal. She exhibits no distension, no fluid wave, no abdominal bruit and no mass. There is no hepatosplenomegaly. There is no tenderness. There is no rebound, no guarding and no CVA tenderness. No hernia.  Musculoskeletal:  ttp over bilateral lateral hips at  troc bursas  Lymphadenopathy:    She has no cervical adenopathy.    She has no axillary adenopathy.  Neurological: She is alert. She has normal strength. No cranial nerve deficit or sensory deficit.  Skin: Skin is warm, dry and intact. No rash noted.  Psychiatric: Her speech is normal and behavior is normal. Judgment normal. Her mood   appears not anxious. Cognition and memory are normal. She does not exhibit a depressed mood.          Assessment & Plan:   

## 2016-11-19 NOTE — Assessment & Plan Note (Addendum)
Very bothersome. She does not want to take daily aleve.  trial of BID diclofenac for 2 weeks.. If not resolved consider PT or referral to ORTHo for injections ( she is hesitant about this)

## 2016-11-19 NOTE — Assessment & Plan Note (Signed)
She has undergone genetic testing ,. She is positive for the CHEK 2 gene  Which estimates a  25 to 44% lifetime risk of BRCA and and 10% increased risk of colon ca.Advised to have a CBE  twice year,  And annual imaging with both MRI and mammaogram ( something every 6 months )

## 2016-11-19 NOTE — Assessment & Plan Note (Signed)
Well controlled with amitriptyline at night.  Followed by Dr. Criss Rosales.

## 2016-11-19 NOTE — Assessment & Plan Note (Signed)
Was slightly elevated in 2017.. May need repeat evaluation.

## 2016-12-31 ENCOUNTER — Other Ambulatory Visit (INDEPENDENT_AMBULATORY_CARE_PROVIDER_SITE_OTHER): Payer: Medicare Other

## 2016-12-31 ENCOUNTER — Telehealth: Payer: Self-pay | Admitting: Family Medicine

## 2016-12-31 DIAGNOSIS — E538 Deficiency of other specified B group vitamins: Secondary | ICD-10-CM

## 2016-12-31 DIAGNOSIS — Z13220 Encounter for screening for lipoid disorders: Secondary | ICD-10-CM | POA: Diagnosis not present

## 2016-12-31 DIAGNOSIS — D751 Secondary polycythemia: Secondary | ICD-10-CM

## 2016-12-31 LAB — COMPREHENSIVE METABOLIC PANEL
ALT: 26 U/L (ref 0–35)
AST: 25 U/L (ref 0–37)
Albumin: 4.5 g/dL (ref 3.5–5.2)
Alkaline Phosphatase: 44 U/L (ref 39–117)
BUN: 20 mg/dL (ref 6–23)
CALCIUM: 9.6 mg/dL (ref 8.4–10.5)
CHLORIDE: 100 meq/L (ref 96–112)
CO2: 31 meq/L (ref 19–32)
CREATININE: 1.03 mg/dL (ref 0.40–1.20)
GFR: 56.99 mL/min — ABNORMAL LOW (ref >60.00)
Glucose, Bld: 113 mg/dL — ABNORMAL HIGH (ref 70–99)
Potassium: 4.5 mEq/L (ref 3.5–5.1)
SODIUM: 136 meq/L (ref 135–145)
Total Bilirubin: 0.5 mg/dL (ref 0.2–1.2)
Total Protein: 6.5 g/dL (ref 6.0–8.3)

## 2016-12-31 LAB — CBC WITH DIFFERENTIAL/PLATELET
BASOS PCT: 0.8 % (ref 0.0–3.0)
Basophils Absolute: 0 10*3/uL (ref 0.0–0.1)
EOS ABS: 0.1 10*3/uL (ref 0.0–0.7)
Eosinophils Relative: 1.4 % (ref 0.0–5.0)
HCT: 44.7 % (ref 36.0–46.0)
Hemoglobin: 14.8 g/dL (ref 12.0–15.0)
Lymphocytes Relative: 54.1 % — ABNORMAL HIGH (ref 12.0–46.0)
Lymphs Abs: 2.3 10*3/uL (ref 0.7–4.0)
MCHC: 33 g/dL (ref 30.0–36.0)
MCV: 94.4 fl (ref 78.0–100.0)
MONO ABS: 0.3 10*3/uL (ref 0.1–1.0)
Monocytes Relative: 6.5 % (ref 3.0–12.0)
NEUTROS ABS: 1.5 10*3/uL (ref 1.4–7.7)
Neutrophils Relative %: 37.2 % — ABNORMAL LOW (ref 43.0–77.0)
PLATELETS: 273 10*3/uL (ref 150.0–400.0)
RBC: 4.73 Mil/uL (ref 3.87–5.11)
RDW: 14.4 % (ref 11.5–15.5)
WBC: 4.2 10*3/uL (ref 4.0–10.5)

## 2016-12-31 LAB — LIPID PANEL
CHOL/HDL RATIO: 3
Cholesterol: 196 mg/dL (ref 0–200)
HDL: 58.7 mg/dL (ref >39.00)
LDL CALC: 119 mg/dL — AB (ref 0–99)
NonHDL: 136.97
TRIGLYCERIDES: 88 mg/dL (ref 0.0–149.0)
VLDL: 17.6 mg/dL (ref 0.0–40.0)

## 2016-12-31 LAB — VITAMIN B12: Vitamin B-12: 369 pg/mL (ref 211–911)

## 2016-12-31 NOTE — Telephone Encounter (Signed)
-----   Message from Rachael Sharp sent at 12/31/2016  9:12 AM EDT ----- Regarding: Lab orders, asap Patient is scheduled for CPX labs, please order future labs, Thanks , Terri Welcome to Medicare appt to follow This appt scheduled this am

## 2017-01-08 ENCOUNTER — Encounter: Payer: Self-pay | Admitting: Family Medicine

## 2017-01-08 ENCOUNTER — Ambulatory Visit (INDEPENDENT_AMBULATORY_CARE_PROVIDER_SITE_OTHER): Payer: Medicare Other | Admitting: Family Medicine

## 2017-01-08 VITALS — BP 100/72 | HR 77 | Temp 97.8°F | Ht 65.0 in | Wt 139.0 lb

## 2017-01-08 DIAGNOSIS — Z0000 Encounter for general adult medical examination without abnormal findings: Secondary | ICD-10-CM

## 2017-01-08 DIAGNOSIS — R7303 Prediabetes: Secondary | ICD-10-CM | POA: Diagnosis not present

## 2017-01-08 DIAGNOSIS — R7989 Other specified abnormal findings of blood chemistry: Secondary | ICD-10-CM | POA: Diagnosis not present

## 2017-01-08 DIAGNOSIS — M7061 Trochanteric bursitis, right hip: Secondary | ICD-10-CM

## 2017-01-08 DIAGNOSIS — M7062 Trochanteric bursitis, left hip: Secondary | ICD-10-CM

## 2017-01-08 DIAGNOSIS — E538 Deficiency of other specified B group vitamins: Secondary | ICD-10-CM | POA: Diagnosis not present

## 2017-01-08 DIAGNOSIS — Z23 Encounter for immunization: Secondary | ICD-10-CM | POA: Diagnosis not present

## 2017-01-08 NOTE — Assessment & Plan Note (Signed)
Improved with 2 week course of diclofenac. Using aleve prn.

## 2017-01-08 NOTE — Assessment & Plan Note (Signed)
Unclear significance..  Abnormal percentage of lymphocytes and neutrophils but nml absolute numbers. Normal total wbc, platelets and rbc Present off and on  Since at least 2013.  Pt was very well hydrated prior to labs per pt.  Pt tired but no red flags. Has lost weight internationally.   Will refer back to Dr. Ma Hillock for re-eval.

## 2017-01-08 NOTE — Progress Notes (Signed)
Subjective:    Patient ID: Rachael Sharp, female    DOB: Aug 18, 1950, 66 y.o.   MRN: [id number]  HPI The patient presents for welcome to  medicare wellness, complete physical and review of chronic health problems. He/She also has the following acute concerns today:  I have personally reviewed the Medicare Annual Wellness questionnaire and have noted 1. The patient's medical and social history 2. Their use of alcohol, tobacco or illicit drugs 3. Their current medications and supplements 4. The patient's functional ability including ADL's, fall risks, home safety risks and hearing or visual             impairment. 5. Diet and physical activities 6. Evidence for depression or mood disorders 7.         Updated provider list Cognitive evaluation was performed and recorded on pt medicare questionnaire form. The patients weight, height, BMI and visual acuity have been recorded in the chart  I have made referrals, counseling and provided education to the patient based review of the above and I have provided the pt with a written personalized care plan for preventive services.   Documentation of this information was scanned into the electronic record under the media tab.  Chronic bursitis bilateral hips: Started on diclofenac x 2 weeks. She reports  Pain is much better. 85% better.  Plans to use aleve as needed at this point.   B12 deficiency: nml recent level   Glucose in prediabetes range:   Cholesterol test reviewed AHA 10 year risk:  7.6%, borderline indication for statin.  exercise: walking 3 times a week  diet: She has been working on [employer], but not low carb. 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   Hearing Screening   Method: Audiometry   _0  _1  _2  _3  _4  _5  _6  _7  _8   Right ear:   _9 Left ear:   _10 Visual Acuity Screening   Right eye Left eye Both eyes  Without correction: _11  With correction:       Social History /Family History/Past Medical History reviewed in detail and updated in EMR if needed. Blood pressure 100/72, pulse 77, temperature 97.8 F (36.6 C), temperature source Oral, height _12  (1.651 m), weight 139 lb (63 kg).    Review of Systems  Constitutional: Positive for fatigue. Negative for fever.  HENT: Negative for congestion.   Eyes: Negative for pain.  Respiratory: Negative for cough and shortness of breath.   Cardiovascular: Negative for chest pain, palpitations and leg swelling.  Gastrointestinal: Negative for abdominal pain.  Genitourinary: Negative for dysuria and vaginal bleeding.  Musculoskeletal: Negative for back pain.  Neurological: Negative for syncope, light-headedness and headaches.  Psychiatric/Behavioral: Negative for dysphoric mood.       Objective:   Physical Exam  Constitutional: Vital signs are normal. She appears well-developed and well-nourished. She is cooperative.  Non-toxic appearance. She does not appear ill. No distress.  HENT:  Head: Normocephalic.  Right Ear: Hearing, tympanic membrane, external ear and ear canal normal.  Left Ear: Hearing, tympanic membrane, external ear and ear canal normal.  Nose: Nose normal.  Eyes: Pupils are equal, round, and reactive to light. Conjunctivae, EOM and lids are normal. Lids are everted and swept, no foreign bodies found.  Neck: Trachea normal and normal range  of motion. Neck supple. Carotid bruit is not present. No thyroid mass and no thyromegaly present.  Cardiovascular: Normal rate, regular rhythm, S1 normal, S2 normal, normal heart sounds and intact distal pulses.  Exam reveals no gallop.   No murmur heard. Pulmonary/Chest: Effort normal and breath sounds normal. No respiratory distress. She has no wheezes. She has no rhonchi. She has no rales.  Abdominal: Soft. Normal  appearance and bowel sounds are normal. She exhibits no distension, no fluid wave, no abdominal bruit and no mass. There is no hepatosplenomegaly. There is no tenderness. There is no rebound, no guarding and no CVA tenderness. No hernia.  Genitourinary: No breast swelling, tenderness, discharge or bleeding. Pelvic exam was performed with patient supine.  Lymphadenopathy:    She has no cervical adenopathy.    She has no axillary adenopathy.  Neurological: She is alert. She has normal strength. No cranial nerve deficit or sensory deficit.  Skin: Skin is warm, dry and intact. No rash noted.  Psychiatric: Her speech is normal and behavior is normal. Judgment normal. Her mood appears not anxious. Cognition and memory are normal. She does not exhibit a depressed mood.          Assessment & Plan:  The patient's preventative maintenance and recommended screening tests for an annual wellness exam were reviewed in full today. Brought up to date unless services declined.  Counselled on the importance of diet, exercise, and its role in overall health and mortality. The patient's FH and SH was reviewed, including their home life, tobacco status, and drug and alcohol status.   History of breast cysts 2016, hx of BRCA in paternal aunts. She has undergone genetic testing ,. She is positive for the CHEK 2 gene Which estimates a 25 to 44% lifetime risk of BRCA and and 10% increased risk of colon ca. Advised to have a CBE twice year, And annual imaging with both MRI and mammogram (something every 6 months)   Colonoscopy every 3 years. Dr Hilarie Fredrickson, last 2017 PAP/DVE not indicated, per Dr. Criss Rosales Vaccines: due for PNA and flu Hep C: done  Bone density: will schedule next yeaer with mammogram.

## 2017-01-08 NOTE — Assessment & Plan Note (Signed)
Reviewed ways to lower sugar in diet. Encouraged exercise, weight loss, healthy eating habits.

## 2017-01-08 NOTE — Patient Instructions (Addendum)
Please stop at the front desk to set up referral.  Call  when you are ready to do mammogram so we can order bone density at the same.  Keep working on exercise, low car, low cholesterol diet!

## 2017-01-08 NOTE — Assessment & Plan Note (Signed)
Resolved

## 2017-01-22 ENCOUNTER — Ambulatory Visit: Payer: Medicare Other | Admitting: Oncology

## 2017-01-28 ENCOUNTER — Inpatient Hospital Stay: Payer: Medicare Other

## 2017-01-28 ENCOUNTER — Encounter: Payer: Self-pay | Admitting: Oncology

## 2017-01-28 ENCOUNTER — Inpatient Hospital Stay: Payer: Medicare Other | Attending: Oncology | Admitting: Oncology

## 2017-01-28 VITALS — BP 111/75 | HR 66 | Temp 97.0°F | Resp 16 | Ht 65.5 in | Wt 142.2 lb

## 2017-01-28 DIAGNOSIS — D72820 Lymphocytosis (symptomatic): Secondary | ICD-10-CM | POA: Diagnosis not present

## 2017-01-28 DIAGNOSIS — N183 Chronic kidney disease, stage 3 unspecified: Secondary | ICD-10-CM

## 2017-01-28 DIAGNOSIS — Z87891 Personal history of nicotine dependence: Secondary | ICD-10-CM | POA: Diagnosis not present

## 2017-01-28 DIAGNOSIS — R011 Cardiac murmur, unspecified: Secondary | ICD-10-CM | POA: Diagnosis not present

## 2017-01-28 DIAGNOSIS — Z86010 Personal history of colonic polyps: Secondary | ICD-10-CM | POA: Diagnosis not present

## 2017-01-28 DIAGNOSIS — I341 Nonrheumatic mitral (valve) prolapse: Secondary | ICD-10-CM | POA: Insufficient documentation

## 2017-01-28 DIAGNOSIS — Z8719 Personal history of other diseases of the digestive system: Secondary | ICD-10-CM | POA: Diagnosis not present

## 2017-01-28 DIAGNOSIS — E785 Hyperlipidemia, unspecified: Secondary | ICD-10-CM | POA: Diagnosis not present

## 2017-01-28 DIAGNOSIS — E559 Vitamin D deficiency, unspecified: Secondary | ICD-10-CM | POA: Insufficient documentation

## 2017-01-28 DIAGNOSIS — Z79899 Other long term (current) drug therapy: Secondary | ICD-10-CM | POA: Diagnosis not present

## 2017-01-28 DIAGNOSIS — Z7984 Long term (current) use of oral hypoglycemic drugs: Secondary | ICD-10-CM | POA: Insufficient documentation

## 2017-01-28 DIAGNOSIS — K219 Gastro-esophageal reflux disease without esophagitis: Secondary | ICD-10-CM | POA: Diagnosis not present

## 2017-01-28 LAB — CBC WITH DIFFERENTIAL/PLATELET
Basophils Absolute: 0 10*3/uL (ref 0–0.1)
Basophils Relative: 1 %
Eosinophils Absolute: 0.1 10*3/uL (ref 0–0.7)
Eosinophils Relative: 1 %
HEMATOCRIT: 42.6 % (ref 35.0–47.0)
Hemoglobin: 14.5 g/dL (ref 12.0–16.0)
LYMPHS ABS: 3.3 10*3/uL (ref 1.0–3.6)
LYMPHS PCT: 53 %
MCH: 31.2 pg (ref 26.0–34.0)
MCHC: 34.1 g/dL (ref 32.0–36.0)
MCV: 91.5 fL (ref 80.0–100.0)
MONO ABS: 0.4 10*3/uL (ref 0.2–0.9)
Monocytes Relative: 6 %
NEUTROS ABS: 2.5 10*3/uL (ref 1.4–6.5)
Neutrophils Relative %: 39 %
Platelets: 283 10*3/uL (ref 150–440)
RBC: 4.65 MIL/uL (ref 3.80–5.20)
RDW: 14.4 % (ref 11.5–14.5)
WBC: 6.2 10*3/uL (ref 3.6–11.0)

## 2017-01-28 LAB — LACTATE DEHYDROGENASE: LDH: 122 U/L (ref 98–192)

## 2017-01-28 LAB — FOLATE: Folate: 50.7 ng/mL (ref >5.9)

## 2017-01-28 NOTE — Progress Notes (Signed)
Patient here today as a new patient  

## 2017-01-28 NOTE — Progress Notes (Signed)
Hematology/Oncology Consult note [facility] Telephone:([phone] [phone]  CONSULT NOTE Patient Care Team: Jinny Sanders, MD as PCP - General (Family Medicine)  PURPOSE OF Consultation Lymphocytosis    HISTORY OF PRESENTING ILLNESS:  Rachael Sharp 66 y.o.  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  Patient reports feeling well, denies any weight loss, fever or chills, drenching night sweats, lumps or bumps. She has good energy. She has chronic bursitis. Denies any recent steroid use, denies any herbal medication.  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 She is up-to-date for her schedule of mammogram/MRI and a colonoscopy.  today she is apart accompanied by her husband. She has taken NSAIDs for bursitis for a while recently not taking.   ROS:  Review of Systems  Constitutional: Negative.   HENT:  Negative.   Eyes: Negative.   Respiratory: Negative.   Cardiovascular: Negative.   Gastrointestinal: Negative.   Endocrine: Negative.   Genitourinary: Negative.    Musculoskeletal: Positive for arthralgias.  Skin: Negative.   Neurological: Negative.   Hematological: Negative.   Psychiatric/Behavioral: Negative.     MEDICAL HISTORY:  Past Medical History:  Diagnosis Date  . Colon stricture ([place])   . GERD (gastroesophageal reflux disease)   . Heart murmur    mitral valve prolapse  . Hyperlipidemia   . Hyperplastic colon polyp   . Vitamin D deficiency     SURGICAL HISTORY: Past Surgical History:  Procedure Laterality Date  . ABDOMINAL HYSTERECTOMY  August 2002  . BREAST SURGERY  2002   biopsy x 2 , 1976  . LIPOMA EXCISION  1983  . TONSILLECTOMY  1964  . TUBAL LIGATION  1982    SOCIAL HISTORY: Social History   Social History  . Marital status: Married    Spouse name: N/A  . Number of children: N/A  . Years  of education: N/A   Occupational History  . Not on file.   Social History Main Topics  . Smoking status: Former Smoker    Types: Cigarettes    Quit date: 08/31/1989  . Smokeless tobacco: Never Used     Comment: Quit 1991  . Alcohol use 4.2 oz/week    7 Shots of liquor per week  . Drug use: No  . Sexual activity: Yes   Other Topics Concern  . Not on file   Social History Narrative  . No narrative on file    FAMILY HISTORY: Family History  Problem Relation Age of Onset  . COPD Sister   . Heart disease Sister 47       massive CVA,    . Stroke Sister   . Stroke Mother   . COPD Mother   . Heart disease Father   . Hyperlipidemia Father     ALLERGIES:  is allergic to penicillins and epinephrine.  MEDICATIONS:  Current Outpatient Prescriptions  Medication Sig Dispense Refill  . amitriptyline (ELAVIL) 10 MG tablet Take 30 mg by mouth at bedtime.     Marland Kitchen estradiol (VIVELLE-DOT) 0.025 MG/24HR Place onto the skin.    . fluconazole (DIFLUCAN) 200 MG tablet TAKE ONE TABLET ONCE WEEKLY  3  . tretinoin (RETIN-A) 0.025 % cream Apply topically at bedtime.     No current facility-administered medications for this visit.       Marland Kitchen  PHYSICAL EXAMINATION: ECOG PERFORMANCE STATUS: 0 - Asymptomatic Vitals:  01/28/17 1341  BP: 111/75  Pulse: 66  Resp: 16  Temp: (!) 97 F (36.1 C)   Filed Weights   01/28/17 1341  Weight: 142 lb 4 oz (64.5 kg)    GENERAL:Alert, no distress and comfortable.  EYES: no pallor or icterus OROPHARYNX: no thrush or ulceration; good dentition  NECK: supple, no masses felt LYMPH:  no palpable lymphadenopathy in the cervical, axillary or inguinal regions LUNGS: clear to auscultation and  No wheeze or crackles HEART/CVS: regular rate & rhythm and no murmurs; No lower extremity edema ABDOMEN: abdomen soft, non-tender and normal bowel sounds Musculoskeletal:no cyanosis of digits and no clubbing  PSYCH: alert & oriented x 3  NEURO: no focal  motor/sensory deficits SKIN:  no rashes or significant lesions  LABORATORY DATA:  I have reviewed the data as listed Lab Results  Component Value Date   WBC 4.2 12/31/2016   HGB 14.8 12/31/2016   HCT 44.7 12/31/2016   MCV 94.4 12/31/2016   PLT 273.0 12/31/2016    Recent Labs  12/31/16 1116  NA 136  K 4.5  CL 100  CO2 31  GLUCOSE 113*  BUN 20  CREATININE 1.03  CALCIUM 9.6  PROT 6.5  ALBUMIN 4.5  AST 25  ALT 26  ALKPHOS 44  BILITOT 0.5    RADIOGRAPHIC STUDIES: I have personally reviewed the radiological images as listed and agreed with the findings in the report.   ASSESSMENT & PLAN:  1. Lymphocytosis   2. CKD (chronic kidney disease) stage 3, GFR 30-59 ml/min    Discussed with patient that lymphocytosis can be reactive versus a clonal process. Check CBC, CMP, peripheral flow cytometry, folate, SPEP, free light chain.  She is tested negative for HIV and hepatitis C    All questions were answered. The patient knows to call the clinic with any problems questions or concerns.  Return of visit: 2 weeks to discuss lab results.   Thank you for this kind referral and the opportunity to participate in the care of this patient. A copy of today's note is routed to referring provider    Earlie Server, MD, PhD Hematology Oncology [facility] at [facility] Pager- [phone] 01/28/2017

## 2017-02-01 LAB — COMP PANEL: LEUKEMIA/LYMPHOMA

## 2017-02-03 NOTE — Telephone Encounter (Signed)
Orders

## 2017-02-08 NOTE — Telephone Encounter (Signed)
Error

## 2017-02-10 ENCOUNTER — Inpatient Hospital Stay: Payer: Medicare Other

## 2017-02-10 ENCOUNTER — Inpatient Hospital Stay: Payer: Medicare Other | Attending: Oncology | Admitting: Oncology

## 2017-02-10 ENCOUNTER — Encounter: Payer: Self-pay | Admitting: Oncology

## 2017-02-10 VITALS — BP 124/79 | HR 73 | Temp 97.5°F | Wt 142.4 lb

## 2017-02-10 DIAGNOSIS — Z79899 Other long term (current) drug therapy: Secondary | ICD-10-CM

## 2017-02-10 DIAGNOSIS — K219 Gastro-esophageal reflux disease without esophagitis: Secondary | ICD-10-CM | POA: Insufficient documentation

## 2017-02-10 DIAGNOSIS — Z86010 Personal history of colonic polyps: Secondary | ICD-10-CM | POA: Diagnosis not present

## 2017-02-10 DIAGNOSIS — Z87891 Personal history of nicotine dependence: Secondary | ICD-10-CM | POA: Diagnosis not present

## 2017-02-10 DIAGNOSIS — I341 Nonrheumatic mitral (valve) prolapse: Secondary | ICD-10-CM | POA: Insufficient documentation

## 2017-02-10 DIAGNOSIS — E559 Vitamin D deficiency, unspecified: Secondary | ICD-10-CM | POA: Diagnosis not present

## 2017-02-10 DIAGNOSIS — D72820 Lymphocytosis (symptomatic): Secondary | ICD-10-CM | POA: Diagnosis not present

## 2017-02-10 DIAGNOSIS — E785 Hyperlipidemia, unspecified: Secondary | ICD-10-CM | POA: Insufficient documentation

## 2017-02-10 DIAGNOSIS — Z803 Family history of malignant neoplasm of breast: Secondary | ICD-10-CM | POA: Diagnosis not present

## 2017-02-10 DIAGNOSIS — R011 Cardiac murmur, unspecified: Secondary | ICD-10-CM | POA: Diagnosis not present

## 2017-02-10 DIAGNOSIS — M719 Bursopathy, unspecified: Secondary | ICD-10-CM | POA: Diagnosis not present

## 2017-02-10 DIAGNOSIS — K56699 Other intestinal obstruction unspecified as to partial versus complete obstruction: Secondary | ICD-10-CM

## 2017-02-10 NOTE — Progress Notes (Signed)
Hematology/Oncology Follow Up Note [facility] [phone] [phone]   Patient Care Team: Jinny Sanders, MD as PCP - General (Family Medicine)  REASON FOR VISIT Follow up for evaluation and management of Lymphocytosis    HISTORY OF PRESENTING ILLNESS:  Rachael Sharp 66 y.o.  female with PMH listed as below presents to discuss about results. She has good energy. She has chronic bursitis. Denies any recent steroid use, denies any herbal medication. She has taken NSAIDs for bursitis for a while recently not taking.  She also recently had fungal nail infection and finished a course of anti fungal antibiotics within the last month. Patient reports feeling well, denies any weight loss, fever or chills, drenching night sweats, lumps or bumps.  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 She is up-to-date for her schedule of mammogram/MRI and a colonoscopy. today she is apart accompanied by her husband.  She was not happy that today she has to wait more than one hour to see me. She complained that RN was rude to her today. Explained to patient that we had some emergent issue with anther patient which causes the delay and I have apologized to her for the delay. She expresses understanding.  Otherwise she feels well today.   ROS:  Review of Systems  Constitutional: Negative.   HENT:  Negative.   Eyes: Negative.   Respiratory: Negative.   Cardiovascular: Negative.   Gastrointestinal: Negative.   Endocrine: Negative.   Genitourinary: Negative.    Musculoskeletal: Positive for arthralgias.  Skin: Negative.   Neurological: Negative.   Hematological: Negative.   Psychiatric/Behavioral: Negative.     MEDICAL HISTORY:  Past Medical History:  Diagnosis Date  . Colon stricture ([place])   . GERD (gastroesophageal reflux disease)   . Heart murmur    mitral valve prolapse  . Hyperlipidemia   . Hyperplastic  colon polyp   . Vitamin D deficiency     SURGICAL HISTORY: Past Surgical History:  Procedure Laterality Date  . ABDOMINAL HYSTERECTOMY  August 2002  . BREAST SURGERY  2002   biopsy x 2 , 1976  . LIPOMA EXCISION  1983  . TONSILLECTOMY  1964  . TUBAL LIGATION  1982    SOCIAL HISTORY: Social History   Social History  . Marital status: Married    Spouse name: N/A  . Number of children: N/A  . Years of education: N/A   Occupational History  . Not on file.   Social History Main Topics  . Smoking status: Former Smoker    Types: Cigarettes    Quit date: 08/31/1989  . Smokeless tobacco: Never Used     Comment: Quit 1991  . Alcohol use 4.2 oz/week    7 Shots of liquor per week  . Drug use: No  . Sexual activity: Yes   Other Topics Concern  . Not on file   Social History Narrative  . No narrative on file    FAMILY HISTORY: Family History  Problem Relation Age of Onset  . COPD Sister   . Heart disease Sister 74       massive CVA,    . Stroke Sister   . Stroke Mother   . COPD Mother   . Heart disease Father   . Hyperlipidemia Father   . Breast cancer Paternal Aunt   . Breast cancer Paternal Aunt   . Breast cancer Paternal Aunt   . Breast cancer Paternal Aunt  ALLERGIES:  is allergic to penicillins and epinephrine.  MEDICATIONS:  Current Outpatient Prescriptions  Medication Sig Dispense Refill  . amitriptyline (ELAVIL) 10 MG tablet Take 30 mg by mouth at bedtime.     Marland Kitchen estradiol (VIVELLE-DOT) 0.025 MG/24HR Place onto the skin.    Marland Kitchen tretinoin (RETIN-A) 0.025 % cream Apply topically at bedtime.     No current facility-administered medications for this visit.       Marland Kitchen  PHYSICAL EXAMINATION: ECOG PERFORMANCE STATUS: 0 - Asymptomatic Vitals:   02/10/17 1456  BP: 124/79  Pulse: 73  Temp: (!) 97.5 F (36.4 C)   Filed Weights   02/10/17 1456  Weight: 142 lb 7 oz (64.6 kg)    GENERAL:Alert, no distress and comfortable.  EYES: no pallor or  icterus OROPHARYNX: no thrush or ulceration; good dentition  NECK: supple, no masses felt LYMPH:  no palpable lymphadenopathy in the cervical, axillary or inguinal regions LUNGS: clear to auscultation and  No wheeze or crackles HEART/CVS: regular rate & rhythm and no murmurs; No lower extremity edema ABDOMEN: abdomen soft, non-tender and normal bowel sounds Musculoskeletal:no cyanosis of digits and no clubbing  PSYCH: alert & oriented x 3  NEURO: no focal motor/sensory deficits SKIN:  no rashes or significant lesions  LABORATORY DATA:  I have reviewed the data as listed Lab Results  Component Value Date   WBC 6.2 01/28/2017   HGB 14.5 01/28/2017   HCT 42.6 01/28/2017   MCV 91.5 01/28/2017   PLT 283 01/28/2017    Recent Labs  12/31/16 1116  NA 136  K 4.5  CL 100  CO2 31  GLUCOSE 113*  BUN 20  CREATININE 1.03  CALCIUM 9.6  PROT 6.5  ALBUMIN 4.5  AST 25  ALT 26  ALKPHOS 44  BILITOT 0.5  She is tested negative for HIV and hepatitis C  Normal LDH, Folate, B12  RADIOGRAPHIC STUDIES: I have personally reviewed the radiological images as listed and agreed with the findings in the report. No recent labs.   ASSESSMENT & PLAN:  1. Lymphocytosis    Discussed with patient that lymphocytosis mostly likely reactive given that no monoclonal process was detected on peripheral blood flow cytometry. I suggest to monitor the WBC counts and have her follow up with me in 3 weeks. All questions were answered. The patient knows to call the clinic with any problems questions or concerns.  Return of visit: 3 months with cbc, cmp Thank you for this kind referral and the opportunity to participate in the care of this patient. A copy of today's note is routed to referring provider    Earlie Server, MD, PhD Hematology Oncology [facility] at [facility] Pager- [phone] 02/10/2017

## 2017-02-10 NOTE — Progress Notes (Signed)
Patient here today for follow up.  Patient states no new concerns today  

## 2017-02-15 ENCOUNTER — Encounter: Payer: Self-pay | Admitting: Family Medicine

## 2017-02-15 DIAGNOSIS — R7989 Other specified abnormal findings of blood chemistry: Secondary | ICD-10-CM

## 2017-02-15 DIAGNOSIS — D751 Secondary polycythemia: Secondary | ICD-10-CM

## 2017-03-19 ENCOUNTER — Ambulatory Visit
Admission: RE | Admit: 2017-03-19 | Discharge: 2017-03-19 | Disposition: A | Discharge status: Home / Self Care | Payer: Medicare Other | Source: Ambulatory Visit | Attending: Obstetrics and Gynecology | Admitting: Obstetrics and Gynecology

## 2017-03-19 DIAGNOSIS — N6002 Solitary cyst of left breast: Secondary | ICD-10-CM | POA: Diagnosis not present

## 2017-03-19 DIAGNOSIS — Z9189 Other specified personal risk factors, not elsewhere classified: Secondary | ICD-10-CM

## 2017-03-19 DIAGNOSIS — N6001 Solitary cyst of right breast: Secondary | ICD-10-CM | POA: Diagnosis not present

## 2017-03-19 MED ORDER — GADOBENATE DIMEGLUMINE 529 MG/ML IV SOLN
12.0000 mL | Freq: Once | INTRAVENOUS | Status: AC | PRN
  Administered 2017-03-19: 12 mL via INTRAVENOUS

## 2017-04-30 DIAGNOSIS — B351 Tinea unguium: Secondary | ICD-10-CM | POA: Diagnosis not present

## 2017-05-10 ENCOUNTER — Other Ambulatory Visit: Payer: Self-pay | Admitting: *Deleted

## 2017-05-10 ENCOUNTER — Other Ambulatory Visit: Payer: Self-pay | Admitting: Obstetrics and Gynecology

## 2017-05-10 DIAGNOSIS — Z139 Encounter for screening, unspecified: Secondary | ICD-10-CM

## 2017-05-12 ENCOUNTER — Inpatient Hospital Stay: Payer: Medicare Other

## 2017-05-12 ENCOUNTER — Inpatient Hospital Stay: Payer: Medicare Other | Admitting: Oncology

## 2017-05-13 DIAGNOSIS — Z87891 Personal history of nicotine dependence: Secondary | ICD-10-CM | POA: Diagnosis not present

## 2017-05-13 DIAGNOSIS — D751 Secondary polycythemia: Secondary | ICD-10-CM | POA: Diagnosis not present

## 2017-05-13 DIAGNOSIS — D709 Neutropenia, unspecified: Secondary | ICD-10-CM | POA: Diagnosis not present

## 2017-05-13 DIAGNOSIS — R7989 Other specified abnormal findings of blood chemistry: Secondary | ICD-10-CM | POA: Diagnosis not present

## 2017-05-13 DIAGNOSIS — D72820 Lymphocytosis (symptomatic): Secondary | ICD-10-CM | POA: Diagnosis not present

## 2017-05-17 DIAGNOSIS — N952 Postmenopausal atrophic vaginitis: Secondary | ICD-10-CM | POA: Diagnosis not present

## 2017-05-17 DIAGNOSIS — N951 Menopausal and female climacteric states: Secondary | ICD-10-CM | POA: Diagnosis not present

## 2017-05-17 DIAGNOSIS — R8290 Unspecified abnormal findings in urine: Secondary | ICD-10-CM | POA: Diagnosis not present

## 2017-05-17 DIAGNOSIS — Z90710 Acquired absence of both cervix and uterus: Secondary | ICD-10-CM | POA: Diagnosis not present

## 2017-05-17 DIAGNOSIS — R3989 Other symptoms and signs involving the genitourinary system: Secondary | ICD-10-CM | POA: Diagnosis not present

## 2017-05-17 DIAGNOSIS — N94819 Vulvodynia, unspecified: Secondary | ICD-10-CM | POA: Diagnosis not present

## 2017-05-17 DIAGNOSIS — Z79890 Hormone replacement therapy (postmenopausal): Secondary | ICD-10-CM | POA: Diagnosis not present

## 2017-05-17 DIAGNOSIS — Z1272 Encounter for screening for malignant neoplasm of vagina: Secondary | ICD-10-CM | POA: Diagnosis not present

## 2017-09-23 ENCOUNTER — Encounter: Payer: Self-pay | Admitting: *Deleted

## 2017-09-23 ENCOUNTER — Ambulatory Visit
Admission: RE | Admit: 2017-09-23 | Discharge: 2017-09-23 | Disposition: A | Discharge status: Home / Self Care | Payer: Medicare Other | Source: Ambulatory Visit | Attending: *Deleted | Admitting: *Deleted

## 2017-09-23 DIAGNOSIS — Z139 Encounter for screening, unspecified: Secondary | ICD-10-CM

## 2017-09-23 DIAGNOSIS — Z1231 Encounter for screening mammogram for malignant neoplasm of breast: Secondary | ICD-10-CM | POA: Diagnosis not present

## 2017-10-07 ENCOUNTER — Encounter: Payer: Self-pay | Admitting: Internal Medicine

## 2017-10-07 ENCOUNTER — Ambulatory Visit (INDEPENDENT_AMBULATORY_CARE_PROVIDER_SITE_OTHER): Payer: Medicare Other | Admitting: Internal Medicine

## 2017-10-07 VITALS — BP 116/78 | HR 69 | Temp 98.2°F | Wt 143.0 lb

## 2017-10-07 DIAGNOSIS — R102 Pelvic and perineal pain unspecified side: Secondary | ICD-10-CM

## 2017-10-07 LAB — POC URINALSYSI DIPSTICK (AUTOMATED)
Bilirubin, UA: NEGATIVE
Blood, UA: NEGATIVE
Glucose, UA: NEGATIVE
KETONES UA: NEGATIVE
NITRITE UA: NEGATIVE
PH UA: 6 (ref 5.0–8.0)
PROTEIN UA: NEGATIVE
Spec Grav, UA: 1.02 (ref 1.010–1.025)
UROBILINOGEN UA: 0.2 U/dL

## 2017-10-07 MED ORDER — FLUCONAZOLE 150 MG PO TABS: 150.0000 mg | ORAL_TABLET | Freq: Once | ORAL | 0 refills | Status: AC

## 2017-10-07 MED ORDER — CEPHALEXIN 500 MG PO CAPS: 500.0000 mg | ORAL_CAPSULE | Freq: Two times a day (BID) | ORAL | 0 refills | Status: DC

## 2017-10-08 ENCOUNTER — Encounter: Payer: Self-pay | Admitting: Internal Medicine

## 2017-10-08 NOTE — Patient Instructions (Signed)

## 2017-10-08 NOTE — Progress Notes (Signed)
Subjective:    Patient ID: Rachael Sharp, female    DOB: August 29, 1950, 67 y.o.   MRN: [id number]  HPI  Pt presents to the clinic today with c/o vaginal pain. She reports this started this morning. She describes the pain as a burning sensation. She reports the entire area is swollen and irritated. She denies urinary symptoms. She denies vaginal discharge, odor or itching. She has a history of chronic vaginal pain, typically controlled on Amlodipine 30 mg daily. She no longer follows with GYN.  Review of Systems      Past Medical History:  Diagnosis Date  . 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 GERD (gastroesophageal reflux disease)   . Heart murmur    mitral valve prolapse  . Hyperlipidemia   . Hyperplastic colon polyp   . Vitamin D deficiency     Current Outpatient Medications  Medication Sig Dispense Refill  . amitriptyline (ELAVIL) 10 MG tablet Take 30 mg by mouth at bedtime.     . Biotin 10 MG CAPS Take 1 capsule by mouth daily.    Marland Kitchen BL EVENING PRIMROSE OIL PO Take 1 capsule by mouth at bedtime.    . cholecalciferol (VITAMIN D) 1000 units tablet Take 1,000 Units by mouth daily.    Marland Kitchen estradiol (ESTRACE) 0.1 MG/GM vaginal cream Place 1 Applicatorful vaginally 2 (two) times a week.     . estradiol (VIVELLE-DOT) 0.025 MG/24HR Place onto the skin.    . Misc Natural Products (TART CHERRY ADVANCED) CAPS Take 1 capsule by mouth daily.    . Multiple Vitamin (MULTIVITAMIN) tablet Take 1 tablet by mouth daily.    . Omega-3 Fatty Acids (FISH OIL) 1000 MG CAPS Take 1 capsule by mouth daily.    Marland Kitchen tretinoin (RETIN-A) 0.025 % cream Apply topically at bedtime.    . cephALEXin (KEFLEX) 500 MG capsule Take 1 capsule (500 mg total) by mouth 2 (two) times daily. 10 capsule 0   No current facility-administered medications for this visit.     Allergies  Allergen Reactions  . Penicillins     REACTION: rash  . Epinephrine Palpitations    Family History  Problem Relation Age of Onset  . COPD  Sister   . Heart disease Sister 56       massive CVA,    . Stroke Sister   . Stroke Mother   . COPD Mother   . Heart disease Father   . Hyperlipidemia Father   . Breast cancer Paternal Aunt   . Breast cancer Paternal Aunt   . Breast cancer Paternal Aunt   . Breast cancer Paternal Aunt     Social History   Socioeconomic History  . Marital status: Married    Spouse name: Not on file  . Number of children: Not on file  . Years of education: Not on file  . Highest education level: Not on file  Occupational History  . Not on file  Social Needs  . Financial resource strain: Not on file  . Food insecurity:    Worry: Not on file    Inability: Not on file  . Transportation needs:    Medical: Not on file    Non-medical: Not on file  Tobacco Use  . Smoking status: Former Smoker    Types: Cigarettes    Last attempt to quit: 08/31/1989    Years since quitting: 28.1  . Smokeless tobacco: Never Used  . Tobacco comment: Quit 1991  Substance and Sexual Activity  .  Alcohol use: Yes    Alcohol/week: 4.2 oz    Types: 7 Shots of liquor per week  . Drug use: No  . Sexual activity: Yes  Lifestyle  . Physical activity:    Days per week: Not on file    Minutes per session: Not on file  . Stress: Not on file  Relationships  . Social connections:    Talks on phone: Not on file    Gets together: Not on file    Attends religious service: Not on file    Active member of club or organization: Not on file    Attends meetings of clubs or organizations: Not on file    Relationship status: Not on file  . Intimate partner violence:    Fear of current or ex partner: Not on file    Emotionally abused: Not on file    Physically abused: Not on file    Forced sexual activity: Not on file  Other Topics Concern  . Not on file  Social History Narrative  . Not on file     Constitutional: Denies fever, malaise, fatigue, headache or abrupt weight changes.  Gastrointestinal: Denies abdominal  pain, bloating, constipation, diarrhea or blood in the stool.  GU: Denies urgency, frequency, pain with urination, burning sensation, blood in urine, odor or discharge.   No other specific complaints in a complete review of systems (except as listed in HPI above).  Objective:   Physical Exam  BP 116/78   Pulse 69   Temp 98.2 F (36.8 C) (Oral)   Wt 143 lb (64.9 kg)   SpO2 98%   BMI 23.43 kg/m  Wt Readings from Last 3 Encounters:  10/07/17 143 lb (64.9 kg)  02/10/17 142 lb 7 oz (64.6 kg)  01/28/17 142 lb 4 oz (64.5 kg)    General: Appears her stated age, well developed, well nourished in NAD. Abdomen: Soft and nontender. Normal bowel sounds. No distention or masses noted. Pelvic: Labia swollen but no obvious irritation noted. No discharge or odor present. She declines internal exam.  BMET    Component Value Date/Time   NA 136 12/31/2016 1116   K 4.5 12/31/2016 1116   CL 100 12/31/2016 1116   CO2 31 12/31/2016 1116   GLUCOSE 113 (H) 12/31/2016 1116   BUN 20 12/31/2016 1116   CREATININE 1.03 12/31/2016 1116   CALCIUM 9.6 12/31/2016 1116    Lipid Panel     Component Value Date/Time   CHOL 196 12/31/2016 1116   TRIG 88.0 12/31/2016 1116   HDL 58.70 12/31/2016 1116   CHOLHDL 3 12/31/2016 1116   VLDL 17.6 12/31/2016 1116   LDLCALC 119 (H) 12/31/2016 1116    CBC    Component Value Date/Time   WBC 6.2 01/28/2017 1423   RBC 4.65 01/28/2017 1423   HGB 14.5 01/28/2017 1423   HGB 14.4 01/30/2013 1349   HCT 42.6 01/28/2017 1423   HCT 42.9 01/30/2013 1349   PLT 283 01/28/2017 1423   PLT 267 01/30/2013 1349   MCV 91.5 01/28/2017 1423   MCV 92 01/30/2013 1349   MCH 31.2 01/28/2017 1423   MCHC 34.1 01/28/2017 1423   RDW 14.4 01/28/2017 1423   RDW 13.9 01/30/2013 1349   LYMPHSABS 3.3 01/28/2017 1423   LYMPHSABS 2.2 01/30/2013 1349   MONOABS 0.4 01/28/2017 1423   MONOABS 0.4 01/30/2013 1349   EOSABS 0.1 01/28/2017 1423   EOSABS 0.1 01/30/2013 1349   BASOSABS 0.0  01/28/2017 1423   BASOSABS  0.0 01/30/2013 1349    Hgb A1C No results found for: HGBA1C          Assessment & Plan:   Vaginal Pain:  Urinalysis: 3+ leuks Will send urine culture eRx for Keflex 500 mg BID x 5 days Wet prep: + yeast eRx for Diflucan 150 mg PO x 1, repeat in 3 days if needed Try cool compresses or sitting in a cool bath If persist, could consider increasing Amitriptyline to 50 mg daily  Will follow up after urine culture comes back, return precautions discussed Webb Silversmith, NP

## 2017-10-09 LAB — URINE CULTURE
MICRO NUMBER:: 90652536
SPECIMEN QUALITY: ADEQUATE

## 2017-11-03 DIAGNOSIS — D2272 Melanocytic nevi of left lower limb, including hip: Secondary | ICD-10-CM | POA: Diagnosis not present

## 2017-11-03 DIAGNOSIS — B351 Tinea unguium: Secondary | ICD-10-CM | POA: Diagnosis not present

## 2017-11-03 DIAGNOSIS — D225 Melanocytic nevi of trunk: Secondary | ICD-10-CM | POA: Diagnosis not present

## 2017-11-03 DIAGNOSIS — D2261 Melanocytic nevi of right upper limb, including shoulder: Secondary | ICD-10-CM | POA: Diagnosis not present

## 2017-11-03 DIAGNOSIS — D2262 Melanocytic nevi of left upper limb, including shoulder: Secondary | ICD-10-CM | POA: Diagnosis not present

## 2017-11-03 DIAGNOSIS — L821 Other seborrheic keratosis: Secondary | ICD-10-CM | POA: Diagnosis not present

## 2017-11-03 DIAGNOSIS — D2271 Melanocytic nevi of right lower limb, including hip: Secondary | ICD-10-CM | POA: Diagnosis not present

## 2017-11-03 DIAGNOSIS — D1724 Benign lipomatous neoplasm of skin and subcutaneous tissue of left leg: Secondary | ICD-10-CM | POA: Diagnosis not present

## 2017-11-12 ENCOUNTER — Encounter: Payer: Self-pay | Admitting: Family Medicine

## 2017-11-12 ENCOUNTER — Ambulatory Visit (INDEPENDENT_AMBULATORY_CARE_PROVIDER_SITE_OTHER): Payer: Medicare Other | Admitting: Family Medicine

## 2017-11-12 VITALS — BP 114/66 | HR 67 | Temp 97.8°F | Wt 147.2 lb

## 2017-11-12 DIAGNOSIS — N898 Other specified noninflammatory disorders of vagina: Secondary | ICD-10-CM | POA: Diagnosis not present

## 2017-11-12 DIAGNOSIS — R300 Dysuria: Secondary | ICD-10-CM

## 2017-11-12 LAB — POC URINALSYSI DIPSTICK (AUTOMATED)
Bilirubin, UA: NEGATIVE
Glucose, UA: NEGATIVE
Ketones, UA: NEGATIVE
LEUKOCYTES UA: NEGATIVE
NITRITE UA: NEGATIVE
Protein, UA: NEGATIVE
RBC UA: NEGATIVE
Spec Grav, UA: 1.01 (ref 1.010–1.025)
UROBILINOGEN UA: 0.2 U/dL
pH, UA: 6.5 (ref 5.0–8.0)

## 2017-11-12 NOTE — Progress Notes (Signed)
Subjective:    Patient ID: Rachael Sharp, female    DOB: [birth year], 67 y.o.   MRN: [id number]  HPI This is a 67 yo female who presents today with vaginal discomfort and dysuria. She was seen last month and given diflucan and antibiotic. Stopped estrace vaginal cream about a month ago. Does use Vivelle Dot .0025 twice a week. Has upcoming appointment with PCP.  She has burning and itching. Has vulvodynia and good relief with amitryptilline.   Past Medical History:  Diagnosis Date  . 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 GERD (gastroesophageal reflux disease)   . Heart murmur    mitral valve prolapse  . Hyperlipidemia   . Hyperplastic colon polyp   . Vitamin D deficiency    Past Surgical History:  Procedure Laterality Date  . ABDOMINAL HYSTERECTOMY  August 2002  . BREAST SURGERY  2002   biopsy x 2 , 1976  . LIPOMA EXCISION  1983  . TONSILLECTOMY  1964  . TUBAL LIGATION  1982   Family History  Problem Relation Age of Onset  . COPD Sister   . Heart disease Sister 7       massive CVA,    . Stroke Sister   . Stroke Mother   . COPD Mother   . Heart disease Father   . Hyperlipidemia Father   . Breast cancer Paternal Aunt   . Breast cancer Paternal Aunt   . Breast cancer Paternal Aunt   . Breast cancer Paternal Aunt    Social History   Tobacco Use  . Smoking status: Former Smoker    Types: Cigarettes    Last attempt to quit: 08/31/1989    Years since quitting: 28.2  . Smokeless tobacco: Never Used  . Tobacco comment: Quit 1991  Substance Use Topics  . Alcohol use: Yes    Alcohol/week: 4.2 oz    Types: 7 Shots of liquor per week  . Drug use: No      Review of Systems Per HPI    Objective:   Physical Exam  Constitutional: She is oriented to person, place, and time. She appears well-developed and well-nourished.  HENT:  Head: Normocephalic and atraumatic.  Cardiovascular: Normal rate.  Pulmonary/Chest: Effort normal.  Genitourinary:  Genitourinary Comments:  Minor irritation of introitus, small amount white discharge. Wet Prep obtained.   Neurological: She is alert and oriented to person, place, and time.  Skin: Skin is warm and dry.  Psychiatric: She has a normal mood and affect. Her behavior is normal. Judgment and thought content normal.  Vitals reviewed.     BP 114/66 (BP Location: Left Arm, Patient Position: Sitting, Cuff Size: Normal)   Pulse 67   Temp 97.8 F (36.6 C) (Oral)   Wt 147 lb 4 oz (66.8 kg)   SpO2 97%   BMI 24.13 kg/m  Wt Readings from Last 3 Encounters:  11/12/17 147 lb 4 oz (66.8 kg)  10/07/17 143 lb (64.9 kg)  02/10/17 142 lb 7 oz (64.6 kg)   Results for orders placed or performed in visit on 11/12/17  POCT Urinalysis Dipstick (Automated)  Result Value Ref Range   Color, UA Light Yellow    Clarity, UA Clear    Glucose, UA Negative Negative   Bilirubin, UA Negative    Ketones, UA Negative    Spec Grav, UA 1.010 1.010 - 1.025   Blood, UA Negative    pH, UA 6.5 5.0 - 8.0   Protein, UA Negative Negative  Urobilinogen, UA 0.2 0.2 or 1.0 E.U./dL   Nitrite, UA Negative    Leukocytes, UA Negative Negative       Assessment & Plan:  1. Dysuria - UA nml - POCT Urinalysis Dipstick (Automated) - WET PREP BY MOLECULAR PROBE  2. Vaginal irritation - will check wet prep  - can do additional OTC yeast treatment while awaiting labs - discussed keeping area clean and dry, loose fitting clothing, f/u precautions   Clarene Reamer, FNP-BC  [facility]  11/14/2017 2:37 PM

## 2017-11-12 NOTE — Patient Instructions (Signed)
Good to see you today  Please do another 3 days of Monistat or generic equivalent   I will notify you of wet prep results

## 2017-11-13 LAB — WET PREP BY MOLECULAR PROBE
Candida species: NOT DETECTED
Gardnerella vaginalis: NOT DETECTED
MICRO NUMBER: 90799232
SPECIMEN QUALITY: ADEQUATE
TRICHOMONAS VAG: NOT DETECTED

## 2017-11-14 ENCOUNTER — Encounter: Payer: Self-pay | Admitting: Family Medicine

## 2017-11-15 ENCOUNTER — Encounter: Payer: Self-pay | Admitting: Family Medicine

## 2017-11-24 ENCOUNTER — Other Ambulatory Visit: Payer: Self-pay | Admitting: Obstetrics and Gynecology

## 2017-11-24 DIAGNOSIS — Z9189 Other specified personal risk factors, not elsewhere classified: Secondary | ICD-10-CM

## 2017-12-08 ENCOUNTER — Encounter: Payer: Self-pay | Admitting: Internal Medicine

## 2017-12-08 ENCOUNTER — Ambulatory Visit (INDEPENDENT_AMBULATORY_CARE_PROVIDER_SITE_OTHER): Payer: Medicare Other | Admitting: Internal Medicine

## 2017-12-08 ENCOUNTER — Ambulatory Visit (INDEPENDENT_AMBULATORY_CARE_PROVIDER_SITE_OTHER)
Admission: RE | Admit: 2017-12-08 | Discharge: 2017-12-08 | Disposition: A | Discharge status: Home / Self Care | Payer: Medicare Other | Source: Ambulatory Visit | Attending: Internal Medicine | Admitting: Internal Medicine

## 2017-12-08 VITALS — BP 116/74 | HR 70 | Temp 98.0°F | Wt 144.0 lb

## 2017-12-08 DIAGNOSIS — M7989 Other specified soft tissue disorders: Secondary | ICD-10-CM

## 2017-12-08 DIAGNOSIS — M19042 Primary osteoarthritis, left hand: Secondary | ICD-10-CM | POA: Diagnosis not present

## 2017-12-08 NOTE — Progress Notes (Signed)
Subjective:    Patient ID: Rachael Sharp, female    DOB: 1951-02-11, 67 y.o.   MRN: [id number]  HPI  Pt presents to the clinic today with c/o left index finger redness and swelling. She reports this started 3-4 months ago with a hard lump that came up near the cuticle. She reports 1-2 weeks, another lump came up on the same finger that is red and swollen. She has not noticed any drainage from the area. It is tender to touch. She denies any trauma to the area. She has not taken anything OTC for her symptoms .  Review of Systems      Past Medical History:  Diagnosis Date  . 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 GERD (gastroesophageal reflux disease)   . Heart murmur    mitral valve prolapse  . Hyperlipidemia   . Hyperplastic colon polyp   . Vitamin D deficiency     Current Outpatient Medications  Medication Sig Dispense Refill  . amitriptyline (ELAVIL) 10 MG tablet Take 30 mg by mouth at bedtime.     . Biotin 10 MG CAPS Take 1 capsule by mouth daily.    Marland Kitchen BL EVENING PRIMROSE OIL PO Take 1 capsule by mouth at bedtime.    . cholecalciferol (VITAMIN D) 1000 units tablet Take 1,000 Units by mouth daily.    Marland Kitchen estradiol (ESTRACE) 0.1 MG/GM vaginal cream Place 1 Applicatorful vaginally 2 (two) times a week.     . fluconazole (DIFLUCAN) 100 MG tablet Take 100 mg by mouth once a week.    . Misc Natural Products (TART CHERRY ADVANCED) CAPS Take 1 capsule by mouth daily.    . Multiple Vitamin (MULTIVITAMIN) tablet Take 1 tablet by mouth daily.    . Omega-3 Fatty Acids (FISH OIL) 1000 MG CAPS Take 1 capsule by mouth daily.    Marland Kitchen tretinoin (RETIN-A) 0.025 % cream Apply topically at bedtime.     No current facility-administered medications for this visit.     Allergies  Allergen Reactions  . Penicillins     REACTION: rash  . Epinephrine Palpitations    Family History  Problem Relation Age of Onset  . COPD Sister   . Heart disease Sister 14       massive CVA,    . Stroke Sister   .  Stroke Mother   . COPD Mother   . Heart disease Father   . Hyperlipidemia Father   . Breast cancer Paternal Aunt   . Breast cancer Paternal Aunt   . Breast cancer Paternal Aunt   . Breast cancer Paternal Aunt     Social History   Socioeconomic History  . Marital status: Married    Spouse name: Not on file  . Number of children: Not on file  . Years of education: Not on file  . Highest education level: Not on file  Occupational History  . Not on file  Social Needs  . Financial resource strain: Not on file  . Food insecurity:    Worry: Not on file    Inability: Not on file  . Transportation needs:    Medical: Not on file    Non-medical: Not on file  Tobacco Use  . Smoking status: Former Smoker    Types: Cigarettes    Last attempt to quit: 08/31/1989    Years since quitting: 28.2  . Smokeless tobacco: Never Used  . Tobacco comment: Quit 1991  Substance and Sexual Activity  . Alcohol use: Yes  Alcohol/week: 4.2 oz    Types: 7 Shots of liquor per week  . Drug use: No  . Sexual activity: Yes  Lifestyle  . Physical activity:    Days per week: Not on file    Minutes per session: Not on file  . Stress: Not on file  Relationships  . Social connections:    Talks on phone: Not on file    Gets together: Not on file    Attends religious service: Not on file    Active member of club or organization: Not on file    Attends meetings of clubs or organizations: Not on file    Relationship status: Not on file  . Intimate partner violence:    Fear of current or ex partner: Not on file    Emotionally abused: Not on file    Physically abused: Not on file    Forced sexual activity: Not on file  Other Topics Concern  . Not on file  Social History Narrative  . Not on file     Constitutional: Denies fever, malaise, fatigue, headache or abrupt weight changes.  Musculoskeletal: Pt reports swelling of left finger. Denies decrease in range of motion, difficulty with gait, muscle  pain or joint pain.  Skin: Pt reports red lump of left index finger. Denies rashes, lesions or ulcercations.    No other specific complaints in a complete review of systems (except as listed in HPI above).  Objective:   Physical Exam  BP 116/74   Pulse 70   Temp 98 F (36.7 C) (Oral)   Wt 144 lb (65.3 kg)   SpO2 98%   BMI 23.60 kg/m  Wt Readings from Last 3 Encounters:  12/08/17 144 lb (65.3 kg)  11/12/17 147 lb 4 oz (66.8 kg)  10/07/17 143 lb (64.9 kg)    General: Appears her stated age, well developed, well nourished in NAD. Skin: Warm, dry and intact. No redness or warmth noted. Musculoskeletal: Normal flexion and extension of the left index finger. Herbdens node noted of left index finger. Neurological: Alert and oriented. Sensation intact to BUE.  BMET    Component Value Date/Time   NA 136 12/31/2016 1116   K 4.5 12/31/2016 1116   CL 100 12/31/2016 1116   CO2 31 12/31/2016 1116   GLUCOSE 113 (H) 12/31/2016 1116   BUN 20 12/31/2016 1116   CREATININE 1.03 12/31/2016 1116   CALCIUM 9.6 12/31/2016 1116    Lipid Panel     Component Value Date/Time   CHOL 196 12/31/2016 1116   TRIG 88.0 12/31/2016 1116   HDL 58.70 12/31/2016 1116   CHOLHDL 3 12/31/2016 1116   VLDL 17.6 12/31/2016 1116   LDLCALC 119 (H) 12/31/2016 1116    CBC    Component Value Date/Time   WBC 6.2 01/28/2017 1423   RBC 4.65 01/28/2017 1423   HGB 14.5 01/28/2017 1423   HGB 14.4 01/30/2013 1349   HCT 42.6 01/28/2017 1423   HCT 42.9 01/30/2013 1349   PLT 283 01/28/2017 1423   PLT 267 01/30/2013 1349   MCV 91.5 01/28/2017 1423   MCV 92 01/30/2013 1349   MCH 31.2 01/28/2017 1423   MCHC 34.1 01/28/2017 1423   RDW 14.4 01/28/2017 1423   RDW 13.9 01/30/2013 1349   LYMPHSABS 3.3 01/28/2017 1423   LYMPHSABS 2.2 01/30/2013 1349   MONOABS 0.4 01/28/2017 1423   MONOABS 0.4 01/30/2013 1349   EOSABS 0.1 01/28/2017 1423   EOSABS 0.1 01/30/2013 1349   BASOSABS  0.0 01/28/2017 1423   BASOSABS  0.0 01/30/2013 1349    Hgb A1C No results found for: HGBA1C          Assessment & Plan:   Left Finger Swelling:  Likely just arthritis Xray left index finger today Can take Aleve daily or use Icy Hot as needed  Will follow up after xray, return precautions discussed Webb Silversmith, NP

## 2017-12-08 NOTE — Patient Instructions (Signed)
Arthritis Arthritis means joint pain. It can also mean joint disease. A joint is a place where bones come together. People who have arthritis may have:  Red joints.  Swollen joints.  Stiff joints.  Warm joints.  A fever.  A feeling of being sick.  Follow these instructions at home: Pay attention to any changes in your symptoms. Take these actions to help with your pain and swelling. Medicines  Take over-the-counter and prescription medicines only as told by your doctor.  Do not take aspirin for pain if your doctor says that you may have gout. Activity  Rest your joint if your doctor tells you to.  Avoid activities that make the pain worse.  Exercise your joint regularly as told by your doctor. Try doing exercises like: ? Swimming. ? Water aerobics. ? Biking. ? Walking. Joint Care   If your joint is swollen, keep it raised (elevated) if told by your doctor.  If your joint feels stiff in the morning, try taking a warm shower.  If you have diabetes, do not apply heat without asking your doctor.  If told, apply heat to the joint: ? Put a towel between the joint and the hot pack or heating pad. ? Leave the heat on the area for 20-30 minutes.  If told, apply ice to the joint: ? Put ice in a plastic bag. ? Place a towel between your skin and the bag. ? Leave the ice on for 20 minutes, 2-3 times per day.  Keep all follow-up visits as told by your doctor. Contact a doctor if:  The pain gets worse.  You have a fever. Get help right away if:  You have very bad pain in your joint.  You have swelling in your joint.  Your joint is red.  Many joints become painful and swollen.  You have very bad back pain.  Your leg is very weak.  You cannot control your pee (urine) or poop (stool). This information is not intended to replace advice given to you by your health care provider. Make sure you discuss any questions you have with your health care provider. Document  Released: 07/22/2009 Document Revised: 10/03/2015 Document Reviewed: 07/23/2014 Elsevier Interactive Patient Education  2018 [employer]  

## 2017-12-14 ENCOUNTER — Encounter: Payer: Self-pay | Admitting: Internal Medicine

## 2017-12-14 DIAGNOSIS — M25542 Pain in joints of left hand: Secondary | ICD-10-CM

## 2017-12-15 NOTE — Telephone Encounter (Signed)
Can you future order the labs for the Rheum work up, pt has lab appt scheduled already?

## 2017-12-15 NOTE — Addendum Note (Signed)
Addended by: Ellamae Sia on: 12/15/2017 03:18 PM   Modules accepted: Orders

## 2018-01-11 ENCOUNTER — Ambulatory Visit: Payer: Medicare Other | Admitting: Family Medicine

## 2018-01-11 DIAGNOSIS — Z23 Encounter for immunization: Secondary | ICD-10-CM | POA: Diagnosis not present

## 2018-01-12 ENCOUNTER — Ambulatory Visit (INDEPENDENT_AMBULATORY_CARE_PROVIDER_SITE_OTHER): Payer: Medicare Other

## 2018-01-12 VITALS — BP 100/70 | HR 72 | Temp 98.5°F | Ht 64.75 in | Wt 139.2 lb

## 2018-01-12 DIAGNOSIS — E559 Vitamin D deficiency, unspecified: Secondary | ICD-10-CM | POA: Diagnosis not present

## 2018-01-12 DIAGNOSIS — Z0000 Encounter for general adult medical examination without abnormal findings: Secondary | ICD-10-CM

## 2018-01-12 DIAGNOSIS — E538 Deficiency of other specified B group vitamins: Secondary | ICD-10-CM | POA: Diagnosis not present

## 2018-01-12 DIAGNOSIS — R7989 Other specified abnormal findings of blood chemistry: Secondary | ICD-10-CM

## 2018-01-12 DIAGNOSIS — M25542 Pain in joints of left hand: Secondary | ICD-10-CM | POA: Diagnosis not present

## 2018-01-12 DIAGNOSIS — E2839 Other primary ovarian failure: Secondary | ICD-10-CM

## 2018-01-12 DIAGNOSIS — R7303 Prediabetes: Secondary | ICD-10-CM

## 2018-01-12 DIAGNOSIS — E7849 Other hyperlipidemia: Secondary | ICD-10-CM | POA: Diagnosis not present

## 2018-01-12 LAB — CBC WITH DIFFERENTIAL/PLATELET
BASOS ABS: 0 10*3/uL (ref 0.0–0.1)
Basophils Relative: 0.7 % (ref 0.0–3.0)
Eosinophils Absolute: 0.1 10*3/uL (ref 0.0–0.7)
Eosinophils Relative: 1.2 % (ref 0.0–5.0)
HEMATOCRIT: 45.5 % (ref 36.0–46.0)
Hemoglobin: 15.2 g/dL — ABNORMAL HIGH (ref 12.0–15.0)
Lymphocytes Relative: 44.5 % (ref 12.0–46.0)
Lymphs Abs: 2.2 10*3/uL (ref 0.7–4.0)
MCHC: 33.4 g/dL (ref 30.0–36.0)
MCV: 92.7 fl (ref 78.0–100.0)
MONOS PCT: 7.2 % (ref 3.0–12.0)
Monocytes Absolute: 0.3 10*3/uL (ref 0.1–1.0)
NEUTROS PCT: 46.4 % (ref 43.0–77.0)
Neutro Abs: 2.3 10*3/uL (ref 1.4–7.7)
Platelets: 280 10*3/uL (ref 150.0–400.0)
RBC: 4.91 Mil/uL (ref 3.87–5.11)
RDW: 14.1 % (ref 11.5–15.5)
WBC: 4.9 10*3/uL (ref 4.0–10.5)

## 2018-01-12 LAB — VITAMIN B12: Vitamin B-12: 652 pg/mL (ref 211–911)

## 2018-01-12 LAB — COMPREHENSIVE METABOLIC PANEL
ALK PHOS: 40 U/L (ref 39–117)
ALT: 25 U/L (ref 0–35)
AST: 29 U/L (ref 0–37)
Albumin: 4.5 g/dL (ref 3.5–5.2)
BILIRUBIN TOTAL: 0.5 mg/dL (ref 0.2–1.2)
BUN: 19 mg/dL (ref 6–23)
CALCIUM: 10.2 mg/dL (ref 8.4–10.5)
CO2: 32 mEq/L (ref 19–32)
Chloride: 102 mEq/L (ref 96–112)
Creatinine, Ser: 1.18 mg/dL (ref 0.40–1.20)
GFR: 48.56 mL/min — AB (ref >60.00)
Glucose, Bld: 121 mg/dL — ABNORMAL HIGH (ref 70–99)
Potassium: 5.2 mEq/L — ABNORMAL HIGH (ref 3.5–5.1)
Sodium: 140 mEq/L (ref 135–145)
TOTAL PROTEIN: 7 g/dL (ref 6.0–8.3)

## 2018-01-12 LAB — LIPID PANEL
CHOL/HDL RATIO: 3
Cholesterol: 202 mg/dL — ABNORMAL HIGH (ref 0–200)
HDL: 63 mg/dL (ref >39.00)
LDL Cholesterol: 115 mg/dL — ABNORMAL HIGH (ref 0–99)
NonHDL: 139.15
TRIGLYCERIDES: 119 mg/dL (ref 0.0–149.0)
VLDL: 23.8 mg/dL (ref 0.0–40.0)

## 2018-01-12 LAB — SEDIMENTATION RATE: Sed Rate: 3 mm/hr (ref 0–30)

## 2018-01-12 LAB — VITAMIN D 25 HYDROXY (VIT D DEFICIENCY, FRACTURES): VITD: 37.67 ng/mL (ref 30.00–100.00)

## 2018-01-12 NOTE — Progress Notes (Signed)
Subjective:   Rachael Sharp is a 67 y.o. female who presents for an Initial Medicare Annual Wellness Visit.  Review of Systems    N/A  Cardiac Risk Factors include: advanced age (>71men, >30 women);dyslipidemia     Objective:    Today's Vitals   01/12/18 0833  BP: 100/70  Pulse: 72  Temp: 98.5 F (36.9 C)  TempSrc: Oral  SpO2: 96%  Weight: 139 lb 4 oz (63.2 kg)  Height: 5' 4.75" (1.645 m)  PainSc: 0-No pain   Body mass index is 23.35 kg/m.  Advanced Directives 01/12/2018 02/10/2017 01/28/2017 01/08/2017 10/17/2015  Does Patient Have a Medical Advance Directive? Yes Yes Yes Yes Yes  Type of Paramedic of [place];Living will [place];Living will [place];Living will [place];Living will -  Copy of [place] in Chart? No - copy requested - - No - copy requested No - copy requested    Current Medications (verified) Outpatient Encounter Medications as of 01/12/2018  Medication Sig  . amitriptyline (ELAVIL) 10 MG tablet Take 30 mg by mouth at bedtime.   Marland Kitchen BIOTIN PO Take 5,000 mcg by mouth daily.  . Cholecalciferol (VITAMIN D3 PO) Take 50 mcg by mouth daily.  Marland Kitchen estradiol (CLIMARA - DOSED IN MG/24 HR) 0.025 mg/24hr patch Place 0.025 mg onto the skin once a week.  . fluconazole (DIFLUCAN) 100 MG tablet Take 200 mg by mouth once a week.   . Misc Natural Products (TART CHERRY ADVANCED) CAPS Take 3 capsules by mouth daily.   . Multiple Vitamin (MULTIVITAMIN) tablet Take 2 tablets by mouth daily.   . Omega-3 Fatty Acids (FISH OIL PO) Take 720 mg by mouth daily.  Marland Kitchen tretinoin (RETIN-A) 0.025 % cream Apply topically at bedtime.  . [DISCONTINUED] Biotin 10 MG CAPS Take 1 capsule by mouth daily.  . [DISCONTINUED] BL EVENING PRIMROSE OIL PO Take 1 capsule by mouth at bedtime.  . [DISCONTINUED] cholecalciferol (VITAMIN D) 1000 units tablet Take 1,000 Units by mouth daily.  .  [DISCONTINUED] Omega-3 Fatty Acids (FISH OIL) 1000 MG CAPS Take 1 capsule by mouth daily.   No facility-administered encounter medications on file as of 01/12/2018.     Allergies (verified) Penicillins and Epinephrine   History: Past Medical History:  Diagnosis Date  . 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 GERD (gastroesophageal reflux disease)   . Heart murmur    mitral valve prolapse  . Hyperlipidemia   . Hyperplastic colon polyp   . Vitamin D deficiency    Past Surgical History:  Procedure Laterality Date  . ABDOMINAL HYSTERECTOMY  August 2002  . BREAST SURGERY  2002   biopsy x 2 , 1976  . LIPOMA EXCISION  1983  . TONSILLECTOMY  1964  . TUBAL LIGATION  1982   Family History  Problem Relation Age of Onset  . COPD Sister   . Heart disease Sister 78       massive CVA,    . Stroke Sister   . Stroke Mother   . COPD Mother   . Heart disease Father   . Hyperlipidemia Father   . Breast cancer Paternal Aunt   . Breast cancer Paternal Aunt   . Breast cancer Paternal Aunt   . Breast cancer Paternal Aunt    Social History   Socioeconomic History  . Marital status: Married    Spouse name: Not on file  . Number of children: Not on file  .  Years of education: Not on file  . Highest education level: Not on file  Occupational History  . Not on file  Social Needs  . Financial resource strain: Not on file  . Food insecurity:    Worry: Not on file    Inability: Not on file  . Transportation needs:    Medical: Not on file    Non-medical: Not on file  Tobacco Use  . Smoking status: Former Smoker    Types: Cigarettes    Last attempt to quit: 08/31/1989    Years since quitting: 28.3  . Smokeless tobacco: Never Used  . Tobacco comment: Quit 1991  Substance and Sexual Activity  . Alcohol use: Yes    Alcohol/week: 7.0 standard drinks    Types: 7 Shots of liquor per week  . Drug use: No  . Sexual activity: Yes  Lifestyle  . Physical activity:    Days per week: Not on file     Minutes per session: Not on file  . Stress: Not on file  Relationships  . Social connections:    Talks on phone: Not on file    Gets together: Not on file    Attends religious service: Not on file    Active member of club or organization: Not on file    Attends meetings of clubs or organizations: Not on file    Relationship status: Not on file  Other Topics Concern  . Not on file  Social History Narrative  . Not on file    Tobacco Counseling Counseling given: No Comment: Quit 1991   Clinical Intake:  Pre-visit preparation completed: Yes  Pain : No/denies pain Pain Score: 0-No pain     Nutritional Status: BMI 25 -29 Overweight Nutritional Risks: None Diabetes: No  How often do you need to have someone help you when you read instructions, pamphlets, or other written materials from your doctor or pharmacy?: 1 - Never What is the last grade level you completed in school?: 12th grade + 2 yrs college  Interpreter Needed?: No  Comments: pt lives with spouse Information entered by :: LPinson, LPN   Activities of Daily Living In your present state of health, do you have any difficulty performing the following activities: 01/12/2018  Hearing? N  Vision? N  Difficulty concentrating or making decisions? N  Walking or climbing stairs? N  Dressing or bathing? N  Doing errands, shopping? N  Preparing Food and eating ? N  Using the Toilet? N  In the past six months, have you accidently leaked urine? N  Do you have problems with loss of bowel control? N  Managing your Medications? N  Managing your Finances? N  Housekeeping or managing your Housekeeping? N  Some recent data might be hidden     Immunizations and Health Maintenance Immunization History  Administered Date(s) Administered  . Influenza Split 03/08/2012, 02/11/2014, 02/09/2015  . Influenza Whole 04/05/2011  . Influenza, High Dose Seasonal PF 02/25/2017, 01/11/2018  . Influenza,inj,Quad PF,6+ Mos 03/13/2013    . Influenza-Unspecified 01/30/2016  . Pneumococcal Conjugate-13 01/08/2017  . Tdap 11/09/2010  . Zoster 03/08/2012   There are no preventive care reminders to display for this patient.  Patient Care Team: Jinny Sanders, MD as PCP - General (Family Medicine)  Indicate any recent Medical Services you may have received from other than [facility] providers in the past year (date may be approximate).     Assessment:   This is a routine wellness examination for [place].  Hearing/Vision screen  Hearing Screening   125Hz  250Hz  500Hz  1000Hz  2000Hz  3000Hz  4000Hz  6000Hz  8000Hz   Right ear:   40 40 40  40    Left ear:   40 40 40  40      Visual Acuity Screening   Right eye Left eye Both eyes  Without correction: 20/40 20/25 20/25   With correction:       Dietary issues and exercise activities discussed: Current Exercise Habits: Home exercise routine, Type of exercise: yoga;strength training/weights;Other - see comments(aerobics), Time (Minutes): > 60(60-120 minutes), Frequency (Times/Week): 6, Weekly Exercise (Minutes/Week): 0, Intensity: Moderate, Exercise limited by: None identified  Goals    . Patient Stated     Starting 01/12/2018, I will continue to take medications as prescribed.       Depression Screen PHQ 2/9 Scores 01/12/2018 01/08/2017 11/19/2016 03/09/2012  PHQ - 2 Score 0 0 0 0  PHQ- 9 Score 0 - - -    Fall Risk Fall Risk  01/12/2018 01/08/2017 11/19/2016  Falls in the past year? No No No   Cognitive Function: MMSE - Mini Mental State Exam 01/12/2018  Orientation to time 5  Orientation to Place 5  Registration 3  Attention/ Calculation 0  Recall 3  Language- name 2 objects 0  Language- repeat 1  Language- follow 3 step command 3  Language- read & follow direction 0  Write a sentence 0  Copy design 0  Total score 20     PLEASE NOTE: A Mini-Cog screen was completed. Maximum score is 20. A value of 0 denotes this part of [place] MMSE was not completed or the patient failed  this part of the Mini-Cog screening.   Mini-Cog Screening Orientation to Time - Max 5 pts Orientation to Place - Max 5 pts Registration - Max 3 pts Recall - Max 3 pts Language Repeat - Max 1 pts Language Follow 3 Step Command - Max 3 pts     Screening Tests Health Maintenance  Topic Date Due  . PNA vac Low Risk Adult (2 of 2 - PPSV23) 05/10/2018 (Originally 01/08/2018)  . DEXA SCAN  05/11/2019 (Originally 01/27/2016)  . MAMMOGRAM  09/24/2018  . COLONOSCOPY  10/17/2018  . TETANUS/TDAP  11/08/2020  . INFLUENZA VACCINE  Completed  . Hepatitis C Screening  Completed     Plan:     I have personally reviewed, addressed, and noted the following in the patient's chart:  A. Medical and social history B. Use of alcohol, tobacco or illicit drugs  C. Current medications and supplements D. Functional ability and status E.  Nutritional status F.  Physical activity G. Advance directives H. List of other physicians I.  Hospitalizations, surgeries, and ER visits in previous 12 months J.  [place] to include hearing, vision, cognitive, depression L. Referrals and appointments - none  In addition, I have reviewed and discussed with patient certain preventive protocols, quality metrics, and best practice recommendations. A written personalized care plan for preventive services as well as general preventive health recommendations were provided to patient.  See attached scanned questionnaire for additional information.   Signed,   Lindell Noe, MHA, BS, LPN Health Coach

## 2018-01-12 NOTE — Progress Notes (Signed)
PCP notes:   Health maintenance:  PPSV23 - addressed Bone density - order generated; PCP approval requested  Abnormal screenings:   None  Patient concerns:   None  Nurse concerns:  None  Next PCP appt:   01/14/18 @ 1400

## 2018-01-12 NOTE — Progress Notes (Signed)
I reviewed health advisor's note, was available for consultation, and agree with documentation and plan.   Signed,  Mikaia Janvier T. Giulianna Rocha, MD  

## 2018-01-12 NOTE — Patient Instructions (Addendum)
Rachael Sharp , Thank you for taking time to come for your Medicare Wellness Visit. I appreciate your ongoing commitment to your health goals. Please review the following plan we discussed and let me know if I can assist you in the future.   These are the goals we discussed: Goals    . Patient Stated     Starting 01/12/2018, I will continue to take medications as prescribed.        This is a list of the screening recommended for you and due dates:  Health Maintenance  Topic Date Due  . Pneumonia vaccines (2 of 2 - PPSV23) 05/10/2018*  . DEXA scan (bone density measurement)  05/11/2019*  . Mammogram  09/24/2018  . Colon Cancer Screening  10/17/2018  . Tetanus Vaccine  11/08/2020  . Flu Shot  Completed  .  Hepatitis C: One time screening is recommended by Center for Disease Control  (CDC) for  adults born from [birth year] through 1965.   Completed  *Topic was postponed. The date shown is not the original due date.   Preventive Care for Adults  A healthy lifestyle and preventive care can promote health and wellness. Preventive health guidelines for adults include the following key practices.  . A routine yearly physical is a good way to check with your health care provider about your health and preventive screening. It is a chance to share any concerns and updates on your health and to receive a thorough exam.  . Visit your dentist for a routine exam and preventive care every 6 months. Brush your teeth twice a day and floss once a day. Good oral hygiene prevents tooth decay and gum disease.  . The frequency of eye exams is based on your age, health, family medical history, use  of contact lenses, and other factors. Follow your health care provider's recommendations for frequency of eye exams.  . Eat a healthy diet. Foods like vegetables, fruits, whole grains, low-fat dairy products, and lean protein foods contain the nutrients you need without too many calories. Decrease your intake of foods  high in solid fats, added sugars, and salt. Eat the right amount of calories for you. Get information about a proper diet from your health care provider, if necessary.  . Regular physical exercise is one of the most important things you can do for your health. Most adults should get at least 150 minutes of moderate-intensity exercise (any activity that increases your heart rate and causes you to sweat) each week. In addition, most adults need muscle-strengthening exercises on 2 or more days a week.  Silver Sneakers may be a benefit available to you. To determine eligibility, you may visit the website: www.silversneakers.com or contact program at 218-411-3629 Mon-Fri between 8AM-8PM.   . Maintain a healthy weight. The body mass index (BMI) is a screening tool to identify possible weight problems. It provides an estimate of body fat based on height and weight. Your health care provider can find your BMI and can help you achieve or maintain a healthy weight.   For adults 20 years and older: ? A BMI below 18.5 is considered underweight. ? A BMI of 18.5 to 24.9 is normal. ? A BMI of 25 to 29.9 is considered overweight. ? A BMI of 30 and above is considered obese.   . Maintain normal blood lipids and cholesterol levels by exercising and minimizing your intake of saturated fat. Eat a balanced diet with plenty of fruit and vegetables. Blood tests for lipids and  cholesterol should begin at age 61 and be repeated every 5 years. If your lipid or cholesterol levels are high, you are over 50, or you are at high risk for heart disease, you may need your cholesterol levels checked more frequently. Ongoing high lipid and cholesterol levels should be treated with medicines if diet and exercise are not working.  . If you smoke, find out from your health care provider how to quit. If you do not use tobacco, please do not start.  . If you choose to drink alcohol, please do not consume more than 2 drinks per day.  One drink is considered to be 12 ounces (355 mL) of beer, 5 ounces (148 mL) of wine, or 1.5 ounces (44 mL) of liquor.  . If you are 39-22 years old, ask your health care provider if you should take aspirin to prevent strokes.  . Use sunscreen. Apply sunscreen liberally and repeatedly throughout the day. You should seek shade when your shadow is shorter than you. Protect yourself by wearing long sleeves, pants, a wide-brimmed hat, and sunglasses year round, whenever you are outdoors.  . Once a month, do a whole body skin exam, using a mirror to look at the skin on your back. Tell your health care provider of new moles, moles that have irregular borders, moles that are larger than a pencil eraser, or moles that have changed in shape or color.

## 2018-01-14 ENCOUNTER — Encounter: Payer: Self-pay | Admitting: Family Medicine

## 2018-01-14 ENCOUNTER — Ambulatory Visit (INDEPENDENT_AMBULATORY_CARE_PROVIDER_SITE_OTHER): Payer: Medicare Other | Admitting: Family Medicine

## 2018-01-14 VITALS — BP 120/80 | HR 82 | Temp 98.4°F | Ht 64.75 in | Wt 139.8 lb

## 2018-01-14 DIAGNOSIS — N289 Disorder of kidney and ureter, unspecified: Secondary | ICD-10-CM

## 2018-01-14 DIAGNOSIS — E559 Vitamin D deficiency, unspecified: Secondary | ICD-10-CM | POA: Diagnosis not present

## 2018-01-14 DIAGNOSIS — R7989 Other specified abnormal findings of blood chemistry: Secondary | ICD-10-CM | POA: Diagnosis not present

## 2018-01-14 DIAGNOSIS — Z1509 Genetic susceptibility to other malignant neoplasm: Secondary | ICD-10-CM

## 2018-01-14 DIAGNOSIS — R7303 Prediabetes: Secondary | ICD-10-CM | POA: Diagnosis not present

## 2018-01-14 DIAGNOSIS — Z1502 Genetic susceptibility to malignant neoplasm of ovary: Secondary | ICD-10-CM

## 2018-01-14 DIAGNOSIS — Z1501 Genetic susceptibility to malignant neoplasm of breast: Secondary | ICD-10-CM | POA: Diagnosis not present

## 2018-01-14 DIAGNOSIS — Z1589 Genetic susceptibility to other disease: Secondary | ICD-10-CM | POA: Diagnosis not present

## 2018-01-14 DIAGNOSIS — E538 Deficiency of other specified B group vitamins: Secondary | ICD-10-CM

## 2018-01-14 DIAGNOSIS — N904 Leukoplakia of vulva: Secondary | ICD-10-CM

## 2018-01-14 DIAGNOSIS — E7849 Other hyperlipidemia: Secondary | ICD-10-CM

## 2018-01-14 LAB — POCT GLYCOSYLATED HEMOGLOBIN (HGB A1C): Hemoglobin A1C: 5.6 % (ref 4.0–5.6)

## 2018-01-14 NOTE — Assessment & Plan Note (Signed)
Advised to have a CBE  twice year,  And annual imaging with both MRI and mammaogram ( something every 6 months )  Breast exam nml today.

## 2018-01-14 NOTE — Patient Instructions (Addendum)
Stop antiinflammatories... Aleve. Use tylenol instead.

## 2018-01-14 NOTE — Progress Notes (Signed)
Subjective:    Patient ID: Rachael Sharp, female    DOB: [birth year], 67 y.o.   MRN: [id number]  HPI  The patient presents for complete physical and review of chronic health problems. He/She also has the following acute concerns today:  The patient saw Candis Musa, LPN for medicare wellness. Note reviewed in detail and important notes copied below. Health maintenance:  PPSV23 - addressed Bone density - order generated; PCP approval requested  Abnormal screenings:   None  01/14/18 Today   She noted  Left DIP joint pain.. She request  Rheum work up.  Hemoglobin Hg 15.2 fairly Seen Heme at [facility]  B12 deficiency: nml recent level  Prediabetes: stable High cholesterol: Improved from last year. LDL 115 and HDL 63 Lab Results  Component Value Date   CHOL 202 (H) 01/12/2018   HDL 63.00 01/12/2018   LDLCALC 115 (H) 01/12/2018   LDLDIRECT 149.0 03/22/2013   TRIG 119.0 01/12/2018   CHOLHDL 3 01/12/2018    10 year risk:   now down to 5.2% risk in next 10 years!   diet: She has been working on healthy  exercise: 30 min aerobic exercise daily  Vulvadynia: Dr. Criss Rosales retired.. pain controlled with: amitriptyline 30 mg at bedtime.Marland Kitchen   Has stopped estrogen cream and is using climara patch once a week.. Tolerable hot flashe  renal insufficiency:  Takes aleve 2 a day when traveling.  Social History /Family History/Past Medical History reviewed in detail and updated in EMR if needed. Blood pressure 120/80, pulse 82, temperature 98.4 F (36.9 C), temperature source Oral, height 5' 4.75" (1.645 m), weight 139 lb 12 oz (63.4 kg).   Review of Systems  Constitutional: Negative for fatigue and fever.  HENT: Negative for congestion.   Eyes: Negative for pain.  Respiratory: Negative for cough and shortness of breath.   Cardiovascular: Negative for chest pain, palpitations and leg swelling.  Gastrointestinal: Negative for abdominal pain.  Genitourinary: Negative for  dysuria and vaginal bleeding.  Musculoskeletal: Negative for back pain.  Neurological: Negative for syncope, light-headedness and headaches.  Psychiatric/Behavioral: Negative for dysphoric mood.       Objective:   Physical Exam  Constitutional: Vital signs are normal. She appears well-developed and well-nourished. She is cooperative.  Non-toxic appearance. She does not appear ill. No distress.  HENT:  Head: Normocephalic.  Right Ear: Hearing, tympanic membrane, external ear and ear canal normal.  Left Ear: Hearing, tympanic membrane, external ear and ear canal normal.  Nose: Nose normal.  Eyes: Pupils are equal, round, and reactive to light. Conjunctivae, EOM and lids are normal. Lids are everted and swept, no foreign bodies found.  Neck: Trachea normal and normal range of motion. Neck supple. Carotid bruit is not present. No thyroid mass and no thyromegaly present.  Cardiovascular: Normal rate, regular rhythm, S1 normal, S2 normal, normal heart sounds and intact distal pulses. Exam reveals no gallop.  No murmur heard. Pulmonary/Chest: Effort normal and breath sounds normal. No respiratory distress. She has no wheezes. She has no rhonchi. She has no rales. Right breast exhibits no inverted nipple, no mass, no nipple discharge, no skin change and no tenderness. Left breast exhibits no inverted nipple, no mass, no nipple discharge and no tenderness.  Abdominal: Soft. Normal appearance and bowel sounds are normal. She exhibits no distension, no fluid wave, no abdominal bruit and no mass. There is no hepatosplenomegaly. There is no tenderness. There is no rebound, no guarding and no CVA tenderness. No hernia.  Lymphadenopathy:    She has no cervical adenopathy.    She has no axillary adenopathy.  Neurological: She is alert. She has normal strength. No cranial nerve deficit or sensory deficit.  Skin: Skin is warm, dry and intact. No rash noted.  Psychiatric: Her speech is normal and behavior is  normal. Judgment normal. Her mood appears not anxious. Cognition and memory are normal. She does not exhibit a depressed mood.          Assessment & Plan:  The patient's preventative maintenance and recommended screening tests for an annual wellness exam were reviewed in full today. Brought up to date unless services declined.  Counselled on the importance of diet, exercise, and its role in overall health and mortality. The patient's FH and SH was reviewed, including their home life, tobacco status, and drug and alcohol status.   Colonoscopy every 3 years. Dr Hilarie Fredrickson, last 2017 PAP/DVE not indicated,  Dr. Criss Rosales retired , [place]. Needs yearly  Vaginal exam. Vaccines: Flu vaccine given and PNA in 30 days. Hep C: done  Mammogram: Chek 2 mutation. Annual mam with alternating years breast MRI (next MRI 03/2018) . Needs breast exam twice d year.  Bone density: scheduled

## 2018-01-14 NOTE — Assessment & Plan Note (Signed)
Stop NSAID.

## 2018-01-14 NOTE — Assessment & Plan Note (Signed)
Eval with HEME unremarkable. Stable over time.

## 2018-01-14 NOTE — Assessment & Plan Note (Signed)
nml exam.

## 2018-01-14 NOTE — Addendum Note (Signed)
Addended by: Eliezer Lofts E on: 01/14/2018 03:11 PM   Modules accepted: Orders

## 2018-01-14 NOTE — Assessment & Plan Note (Addendum)
Needs yearly DVE. Check into need for vaginal pap and GYN was doing.  Stable on amitriptyline.

## 2018-01-14 NOTE — Assessment & Plan Note (Signed)
Check A1C today

## 2018-01-16 LAB — RHEUMATOID FACTOR

## 2018-01-16 LAB — ANA: Anti Nuclear Antibody(ANA): NEGATIVE

## 2018-02-09 ENCOUNTER — Telehealth: Payer: Self-pay

## 2018-02-09 NOTE — Telephone Encounter (Signed)
Copied from [place] [phone]. Topic: Appointment Scheduling - Prior Auth Required for Appointment >> Feb 09, 2018  1:47 PM Ivar Drape wrote: No appointment has been scheduled. Patient is requesting a nurse visit for a pneumonia Injection appointment. Per scheduling protocol, this appointment requires a prior authorization prior to scheduling.  Route to department's PEC pool.

## 2018-02-09 NOTE — Telephone Encounter (Signed)
Robin, Please schedule nurse visit for Pneumovax 23 vaccine.

## 2018-02-10 NOTE — Telephone Encounter (Signed)
Appointment 10/23

## 2018-03-02 ENCOUNTER — Ambulatory Visit: Payer: Medicare Other

## 2018-03-10 ENCOUNTER — Ambulatory Visit
Admission: RE | Admit: 2018-03-10 | Discharge: 2018-03-10 | Disposition: A | Discharge status: Home / Self Care | Payer: Medicare Other | Source: Ambulatory Visit | Attending: Family Medicine | Admitting: Family Medicine

## 2018-03-10 DIAGNOSIS — E2839 Other primary ovarian failure: Secondary | ICD-10-CM | POA: Diagnosis not present

## 2018-03-11 ENCOUNTER — Encounter: Payer: Self-pay | Admitting: *Deleted

## 2018-03-17 ENCOUNTER — Ambulatory Visit (INDEPENDENT_AMBULATORY_CARE_PROVIDER_SITE_OTHER): Payer: Medicare Other

## 2018-03-17 DIAGNOSIS — Z23 Encounter for immunization: Secondary | ICD-10-CM | POA: Diagnosis not present

## 2018-03-28 ENCOUNTER — Ambulatory Visit
Admission: RE | Admit: 2018-03-28 | Discharge: 2018-03-28 | Disposition: A | Discharge status: Home / Self Care | Payer: Medicare Other | Source: Ambulatory Visit | Attending: Obstetrics and Gynecology | Admitting: Obstetrics and Gynecology

## 2018-03-28 DIAGNOSIS — Z853 Personal history of malignant neoplasm of breast: Secondary | ICD-10-CM | POA: Diagnosis not present

## 2018-03-28 DIAGNOSIS — Z9189 Other specified personal risk factors, not elsewhere classified: Secondary | ICD-10-CM

## 2018-03-28 MED ORDER — GADOBUTROL 1 MMOL/ML IV SOLN
7.0000 mL | Freq: Once | INTRAVENOUS | Status: AC | PRN
  Administered 2018-03-28: 7 mL via INTRAVENOUS

## 2018-05-18 ENCOUNTER — Encounter: Payer: Self-pay | Admitting: Obstetrics & Gynecology

## 2018-05-18 ENCOUNTER — Ambulatory Visit (INDEPENDENT_AMBULATORY_CARE_PROVIDER_SITE_OTHER): Payer: Medicare Other | Admitting: Obstetrics & Gynecology

## 2018-05-18 VITALS — BP 118/76 | Ht 64.5 in | Wt 147.0 lb

## 2018-05-18 DIAGNOSIS — Z01419 Encounter for gynecological examination (general) (routine) without abnormal findings: Secondary | ICD-10-CM | POA: Diagnosis not present

## 2018-05-18 DIAGNOSIS — Z1502 Genetic susceptibility to malignant neoplasm of ovary: Secondary | ICD-10-CM | POA: Diagnosis not present

## 2018-05-18 DIAGNOSIS — Z1509 Genetic susceptibility to other malignant neoplasm: Secondary | ICD-10-CM

## 2018-05-18 DIAGNOSIS — N94819 Vulvodynia, unspecified: Secondary | ICD-10-CM

## 2018-05-18 DIAGNOSIS — Z90710 Acquired absence of both cervix and uterus: Secondary | ICD-10-CM

## 2018-05-18 DIAGNOSIS — Z1589 Genetic susceptibility to other disease: Secondary | ICD-10-CM

## 2018-05-18 DIAGNOSIS — Z1501 Genetic susceptibility to malignant neoplasm of breast: Secondary | ICD-10-CM

## 2018-05-18 DIAGNOSIS — Z9189 Other specified personal risk factors, not elsewhere classified: Secondary | ICD-10-CM | POA: Diagnosis not present

## 2018-05-18 DIAGNOSIS — Z780 Asymptomatic menopausal state: Secondary | ICD-10-CM | POA: Diagnosis not present

## 2018-05-18 MED ORDER — AMITRIPTYLINE HCL 10 MG PO TABS: 30.0000 mg | ORAL_TABLET | Freq: Every day | ORAL | 4 refills | Status: DC

## 2018-05-18 NOTE — Progress Notes (Signed)
Rachael Sharp [facility] August 13, 1950 [id number]   History:    68 y.o. G0 Married.  Retired, enjoys traveling.  RP:  New patient presenting for annual gyn exam   HPI: Menopause on Estradiol patch 0.025 weekly x 2002.  S/P Total Hysterectomy.  Monoallelic mutation of CHEK2 gene.  Screening mammo/MRI yearly.  Breasts normal.  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  No IC.  Urine/BMs normal.  BMI 24.84.  Aerobic activities 5x per week and light weight lifting.  Health labs with Fam MD.    Past medical history,surgical history, family history and social history were all reviewed and documented in the EPIC chart.  Gynecologic History No LMP recorded. Patient has had a hysterectomy. Contraception: status post hysterectomy Last Pap: 05/2017. Results were: Negative Last mammogram: 09/2017. Results were: Negative.  Carrier of CHEK2 gene, MRI of breasts annually, 03/2018 benign Bone Density: 02/2018 Normal Colonoscopy: 2017  Obstetric History OB History  Gravida Para Term Preterm AB Living  0 0 0 0 0 0  SAB TAB Ectopic Multiple Live Births  0 0 0 0 0     ROS: A ROS was performed and pertinent positives and negatives are included in the history.  GENERAL: No fevers or chills. HEENT: No change in vision, no earache, sore throat or sinus congestion. NECK: No pain or stiffness. CARDIOVASCULAR: No chest pain or pressure. No palpitations. PULMONARY: No shortness of breath, cough or wheeze. GASTROINTESTINAL: No abdominal pain, nausea, vomiting or diarrhea, melena or bright red blood per rectum. GENITOURINARY: No urinary frequency, urgency, hesitancy or dysuria. MUSCULOSKELETAL: No joint or muscle pain, no back pain, no recent trauma. DERMATOLOGIC: No rash, no itching, no lesions. ENDOCRINE: No polyuria, polydipsia, no heat or cold intolerance. No recent change in weight. HEMATOLOGICAL: No anemia or easy bruising or bleeding. NEUROLOGIC: No headache, seizures, numbness, tingling or weakness. PSYCHIATRIC: No  depression, no loss of interest in normal activity or change in sleep pattern.     Exam:   BP 118/76   Ht 5' 4.5" (1.638 m)   Wt 147 lb (66.7 kg)   BMI 24.84 kg/m   Body mass index is 24.84 kg/m.  General appearance : Well developed well nourished female. No acute distress HEENT: Eyes: no retinal hemorrhage or exudates,  Neck supple, trachea midline, no carotid bruits, no thyroidmegaly Lungs: Clear to auscultation, no rhonchi or wheezes, or rib retractions  Heart: Regular rate and rhythm, no murmurs or gallops Breast:Examined in sitting and supine position were symmetrical in appearance, no palpable masses or tenderness,  no skin retraction, no nipple inversion, no nipple discharge, no skin discoloration, no axillary or supraclavicular lymphadenopathy Abdomen: no palpable masses or tenderness, no rebound or guarding Extremities: no edema or skin discoloration or tenderness  Pelvic: Vulva: Normal             Vagina: No gross lesions or discharge  Cervix/Uterus absent  Adnexa  Without masses or tenderness  Anus: Normal   Assessment/Plan:  68 y.o. female for annual exam   1. Well female exam with routine gynecological exam Gynecologic exam status post total hysterectomy and menopause.  Pap test January 2019 was negative.  No indication to repeat this year.  Breast exam normal.  Screening mammogram May 2019 was negative, MRI of breast benign in 03/2018.  2. S/P total hysterectomy  3. Postmenopausal Well on no hormone replacement therapy.  Vitamin D supplements, calcium intake of 1200 to 1500 mg daily, weight bearing physical activities regularly.  Bone density October 2019 was  normal.  Will repeat at 5 years.  4. Vulvodynia Well controlled on amitriptyline.  5. 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  Last MRI November 2019 was benign.  Other orders - fluconazole (DIFLUCAN) 200 MG tablet; Take 200 mg by mouth daily. - amitriptyline  (ELAVIL) 10 MG tablet; Take 3 tablets (30 mg total) by mouth at bedtime.  Counseling on above issues and coordination of care more than 50% for 20 minutes.  Princess Bruins MD, 10:53 AM 05/18/2018

## 2018-05-23 ENCOUNTER — Encounter: Payer: Self-pay | Admitting: Obstetrics & Gynecology

## 2018-05-23 NOTE — Patient Instructions (Signed)
1. Well female exam with routine gynecological exam Gynecologic exam status post total hysterectomy and menopause.  Pap test January 2019 was negative.  No indication to repeat this year.  Breast exam normal.  Screening mammogram May 2019 was negative, MRI of breast benign in 03/2018.  2. S/P total hysterectomy  3. Postmenopausal Well on no hormone replacement therapy.  Vitamin D supplements, calcium intake of 1200 to 1500 mg daily, weight bearing physical activities regularly.  Bone density October 2019 was normal.  Will repeat at 5 years.  4. Vulvodynia Well controlled on amitriptyline.  5. 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  Last MRI November 2019 was benign.  Other orders - fluconazole (DIFLUCAN) 200 MG tablet; Take 200 mg by mouth daily. - amitriptyline (ELAVIL) 10 MG tablet; Take 3 tablets (30 mg total) by mouth at bedtime.  Rachael Sharp, it was a pleasure meeting you today!

## 2018-05-25 DIAGNOSIS — B351 Tinea unguium: Secondary | ICD-10-CM | POA: Diagnosis not present

## 2018-06-29 ENCOUNTER — Other Ambulatory Visit: Payer: Self-pay | Admitting: Obstetrics & Gynecology

## 2018-06-30 NOTE — Telephone Encounter (Signed)
Patient takes 3 tabs hs.  Pharmacy is requesting 90 days supply.

## 2018-07-01 ENCOUNTER — Encounter: Payer: Self-pay | Admitting: Family Medicine

## 2018-07-01 ENCOUNTER — Ambulatory Visit (INDEPENDENT_AMBULATORY_CARE_PROVIDER_SITE_OTHER): Payer: Medicare Other | Admitting: Family Medicine

## 2018-07-01 DIAGNOSIS — S29012A Strain of muscle and tendon of back wall of thorax, initial encounter: Secondary | ICD-10-CM | POA: Diagnosis not present

## 2018-07-01 MED ORDER — CYCLOBENZAPRINE HCL 10 MG PO TABS: 5.0000 mg | ORAL_TABLET | Freq: Every evening | ORAL | 0 refills | Status: DC | PRN

## 2018-07-01 NOTE — Progress Notes (Signed)
Subjective:    Patient ID: Rachael Sharp, female    DOB: 1951/02/17, 68 y.o.   MRN: [id number]  HPI   68 year old female presents for  New onset muscle spasm in left upper back.  Sudden onset in last 4 days. Started after plane flight from [place].  Gradually worsening despite tylenol , heat. No associated fall or injury.   Now feels  like stabbing pain in focal area at shoulder blade. 8/10 on pain scale  No radiation to arms.  No fever  No numbness, no weakness. No leg symptoms or low back pain.  No incontinence.  Blood pressure 110/70, pulse 76, temperature 97.6 F (36.4 C), temperature source Oral, height 5' 4.5" (1.638 m), weight 146 lb 4 oz (66.3 kg).   Social History /Family History/Past Medical History reviewed in detail and updated in EMR if needed. No history of back or neck issues.  Review of Systems  Constitutional: Negative for fatigue and fever.  HENT: Negative for congestion.   Eyes: Negative for pain.  Respiratory: Negative for cough and shortness of breath.   Cardiovascular: Negative for chest pain, palpitations and leg swelling.  Gastrointestinal: Negative for abdominal pain.  Genitourinary: Negative for dysuria and vaginal bleeding.  Musculoskeletal: Negative for back pain.  Neurological: Negative for syncope, light-headedness and headaches.  Psychiatric/Behavioral: Negative for dysphoric mood.       Objective:   Physical Exam Constitutional:      General: She is not in acute distress.    Appearance: Normal appearance. She is well-developed. She is not ill-appearing or toxic-appearing.  HENT:     Head: Normocephalic.     Right Ear: Hearing, tympanic membrane, ear canal and external ear normal. Tympanic membrane is not erythematous, retracted or bulging.     Left Ear: Hearing, tympanic membrane, ear canal and external ear normal. Tympanic membrane is not erythematous, retracted or bulging.     Nose: No mucosal edema or rhinorrhea.     Right  Sinus: No maxillary sinus tenderness or frontal sinus tenderness.     Left Sinus: No maxillary sinus tenderness or frontal sinus tenderness.     Mouth/Throat:     Pharynx: Uvula midline.  Eyes:     General: Lids are normal. Lids are everted, no foreign bodies appreciated.     Conjunctiva/sclera: Conjunctivae normal.     Pupils: Pupils are equal, round, and reactive to light.  Neck:     Musculoskeletal: Normal range of motion and neck supple.     Thyroid: No thyroid mass or thyromegaly.     Vascular: No carotid bruit.     Trachea: Trachea normal.  Cardiovascular:     Rate and Rhythm: Normal rate and regular rhythm.     Pulses: Normal pulses.     Heart sounds: Normal heart sounds, S1 normal and S2 normal. No murmur. No friction rub. No gallop.   Pulmonary:     Effort: Pulmonary effort is normal. No tachypnea or respiratory distress.     Breath sounds: Normal breath sounds. No decreased breath sounds, wheezing, rhonchi or rales.  Abdominal:     General: Bowel sounds are normal.     Palpations: Abdomen is soft.     Tenderness: There is no abdominal tenderness.  Musculoskeletal:     Left shoulder: Normal. She exhibits normal range of motion and no tenderness.     Cervical back: She exhibits tenderness. She exhibits normal range of motion and no bony tenderness.     Comments:  Neg spurling's  Skin:    General: Skin is warm and dry.     Findings: No rash.  Neurological:     Mental Status: She is alert.     Cranial Nerves: Cranial nerves are intact.     Sensory: Sensation is intact.     Motor: Motor function is intact.  Psychiatric:        Mood and Affect: Mood is not anxious or depressed.        Speech: Speech normal.        Behavior: Behavior normal. Behavior is cooperative.        Thought Content: Thought content normal.        Judgment: Judgment normal.           Assessment & Plan:

## 2018-07-01 NOTE — Assessment & Plan Note (Signed)
Likely due to immobility on plane flight. Treat with muscle relaxant. Avoid NSAIDs given renal insufficiency.

## 2018-07-01 NOTE — Patient Instructions (Signed)
Heat, massage, start home exercises.  Use muscle relaxant 1/2 to 1 tab for muscle spasm.  Call if not improving as expected.

## 2018-07-03 DIAGNOSIS — M546 Pain in thoracic spine: Secondary | ICD-10-CM | POA: Diagnosis not present

## 2018-07-08 MED ORDER — AMITRIPTYLINE HCL 10 MG PO TABS: 30.0000 mg | ORAL_TABLET | Freq: Every day | ORAL | 1 refills | Status: DC

## 2018-07-08 NOTE — Addendum Note (Signed)
Addended by: Thamas Jaegers on: 07/08/2018 11:07 AM   Modules accepted: Orders

## 2018-07-12 ENCOUNTER — Other Ambulatory Visit: Payer: Self-pay | Admitting: Family Medicine

## 2018-07-12 DIAGNOSIS — Z1231 Encounter for screening mammogram for malignant neoplasm of breast: Secondary | ICD-10-CM

## 2018-07-24 ENCOUNTER — Encounter: Payer: Self-pay | Admitting: Internal Medicine

## 2018-08-10 DIAGNOSIS — L538 Other specified erythematous conditions: Secondary | ICD-10-CM | POA: Diagnosis not present

## 2018-08-10 DIAGNOSIS — L820 Inflamed seborrheic keratosis: Secondary | ICD-10-CM | POA: Diagnosis not present

## 2018-08-11 ENCOUNTER — Telehealth: Payer: Self-pay | Admitting: *Deleted

## 2018-08-11 MED ORDER — AMITRIPTYLINE HCL 10 MG PO TABS: 30.0000 mg | ORAL_TABLET | Freq: Every day | ORAL | 3 refills | Status: DC

## 2018-08-11 NOTE — Telephone Encounter (Signed)
Pt called to get a corrected prescription on her Elavil 10mg .  The patient takes 3 tabs at bedtime everyday. Wanted a 3 month supply but only received 90 tabs.  Will correct prescription to 270 tabs and send to her pharmacy. KW CMA

## 2018-09-26 ENCOUNTER — Other Ambulatory Visit: Payer: Self-pay

## 2018-09-26 ENCOUNTER — Ambulatory Visit
Admission: RE | Admit: 2018-09-26 | Discharge: 2018-09-26 | Disposition: A | Discharge status: Home / Self Care | Payer: Medicare Other | Source: Ambulatory Visit | Attending: Family Medicine | Admitting: Family Medicine

## 2018-09-26 DIAGNOSIS — Z1231 Encounter for screening mammogram for malignant neoplasm of breast: Secondary | ICD-10-CM | POA: Diagnosis not present

## 2018-10-06 ENCOUNTER — Other Ambulatory Visit: Payer: Self-pay | Admitting: Obstetrics & Gynecology

## 2018-10-06 DIAGNOSIS — Z1589 Genetic susceptibility to other disease: Secondary | ICD-10-CM

## 2018-10-19 DIAGNOSIS — L249 Irritant contact dermatitis, unspecified cause: Secondary | ICD-10-CM | POA: Diagnosis not present

## 2018-10-21 ENCOUNTER — Other Ambulatory Visit: Payer: Self-pay

## 2018-10-21 ENCOUNTER — Ambulatory Visit: Payer: Medicare Other | Admitting: *Deleted

## 2018-10-21 VITALS — Ht 65.0 in | Wt 145.0 lb

## 2018-10-21 DIAGNOSIS — Z1589 Genetic susceptibility to other disease: Secondary | ICD-10-CM

## 2018-10-21 MED ORDER — NA SULFATE-K SULFATE-MG SULF 17.5-3.13-1.6 GM/177ML PO SOLN: 1.0000 | Freq: Once | ORAL | 0 refills | Status: AC

## 2018-10-21 NOTE — Progress Notes (Signed)

## 2018-10-27 ENCOUNTER — Encounter: Payer: Self-pay | Admitting: *Deleted

## 2018-10-27 ENCOUNTER — Encounter: Payer: Self-pay | Admitting: Internal Medicine

## 2018-11-02 ENCOUNTER — Telehealth: Payer: Self-pay | Admitting: Internal Medicine

## 2018-11-02 NOTE — Telephone Encounter (Signed)

## 2018-11-02 NOTE — Telephone Encounter (Signed)
No to all questions

## 2018-11-03 ENCOUNTER — Other Ambulatory Visit: Payer: Self-pay

## 2018-11-03 ENCOUNTER — Encounter: Payer: Self-pay | Admitting: Internal Medicine

## 2018-11-03 ENCOUNTER — Ambulatory Visit (AMBULATORY_SURGERY_CENTER): Payer: Medicare Other | Admitting: Internal Medicine

## 2018-11-03 VITALS — BP 119/73 | HR 65 | Temp 98.0°F | Resp 16 | Ht 65.0 in | Wt 145.0 lb

## 2018-11-03 DIAGNOSIS — Z1501 Genetic susceptibility to malignant neoplasm of breast: Secondary | ICD-10-CM

## 2018-11-03 DIAGNOSIS — Z1211 Encounter for screening for malignant neoplasm of colon: Secondary | ICD-10-CM | POA: Diagnosis not present

## 2018-11-03 DIAGNOSIS — Z1502 Genetic susceptibility to malignant neoplasm of ovary: Secondary | ICD-10-CM | POA: Diagnosis not present

## 2018-11-03 DIAGNOSIS — Z1509 Genetic susceptibility to other malignant neoplasm: Secondary | ICD-10-CM

## 2018-11-03 DIAGNOSIS — Z1589 Genetic susceptibility to other disease: Secondary | ICD-10-CM

## 2018-11-03 HISTORY — PX: COLONOSCOPY: SHX174

## 2018-11-03 MED ORDER — SODIUM CHLORIDE 0.9 % IV SOLN: 500.0000 mL | Freq: Once | INTRAVENOUS | Status: DC

## 2018-11-03 NOTE — Progress Notes (Signed)
Pt's states no medical or surgical changes since previsit or office visit. 

## 2018-11-03 NOTE — Progress Notes (Signed)
Rachael Sharp- temp Rachael Sharp- vitals 

## 2018-11-03 NOTE — Patient Instructions (Addendum)
YOU HAD AN ENDOSCOPIC PROCEDURE TODAY AT THE [facility]:   Refer to the procedure report that was given to you for any specific questions about what was found during the examination.  If the procedure report does not answer your questions, please call your gastroenterologist to clarify.  If you requested that your care partner not be given the details of your procedure findings, then the procedure report has been included in a sealed envelope for you to review at your convenience later.  YOU SHOULD EXPECT: Some feelings of bloating in the abdomen. Passage of more gas than usual.  Walking can help get rid of the air that was put into your GI tract during the procedure and reduce the bloating. If you had a lower endoscopy (such as a colonoscopy or flexible sigmoidoscopy) you may notice spotting of blood in your stool or on the toilet paper. If you underwent a bowel prep for your procedure, you may not have a normal bowel movement for a few days.  Please Note:  You might notice some irritation and congestion in your nose or some drainage.  This is from the oxygen used during your procedure.  There is no need for concern and it should clear up in a day or so.  SYMPTOMS TO REPORT IMMEDIATELY:   Following lower endoscopy (colonoscopy or flexible sigmoidoscopy):  Excessive amounts of blood in the stool  Significant tenderness or worsening of abdominal pains  Swelling of the abdomen that is new, acute  Fever of 100F or higher  For urgent or emergent issues, a gastroenterologist can be reached at any hour by calling [phone].   DIET:  We do recommend a small meal at first, but then you may proceed to your regular diet.  Drink plenty of fluids but you should avoid alcoholic beverages for 24 hours.  ACTIVITY:  You should plan to take it easy for the rest of today and you should NOT DRIVE or use heavy machinery until tomorrow (because of the sedation medicines used during the test).     FOLLOW UP: Our staff will call the number listed on your records 48-72 hours following your procedure to check on you and address any questions or concerns that you may have regarding the information given to you following your procedure. If we do not reach you, we will leave a message.  We will attempt to reach you two times.  During this call, we will ask if you have developed any symptoms of COVID 19. If you develop any symptoms (ie: fever, flu-like symptoms, shortness of breath, cough etc.) before then, please call [phone].  If you test positive for Covid 19 in the 2 weeks post procedure, please call and report this information to us.    If any biopsies were taken you will be contacted by phone or by letter within the next 1-3 weeks.  Please call us at [phone] if you have not heard about the biopsies in 3 weeks.    SIGNATURES/CONFIDENTIALITY: You and/or your care partner have signed paperwork which will be entered into your electronic medical record.  These signatures attest to the fact that that the information above on your After Visit Summary has been reviewed and is understood.  Full responsibility of the confidentiality of this discharge information lies with you and/or your care-partner. 

## 2018-11-03 NOTE — Op Note (Signed)
[facility] Patient Name: Rachael Sharp Procedure Date: 11/03/2018 1:37 PM MRN: [id number] Endoscopist: Jerene Bears , MD Age: 68 Referring MD:  Date of Birth: 11/27/1950 Gender: Female Account #: [id number] Procedure:                Colonoscopy Indications:              Colon cancer screening in patient at increased                            risk: Personal history of CHEK2 gene mutation, last                            colonoscopy 3 years ago Medicines:                Monitored Anesthesia Care Procedure:                Pre-Anesthesia Assessment:                           - Prior to the procedure, a History and Physical                            was performed, and patient medications and                            allergies were reviewed. The patient's tolerance of                            previous anesthesia was also reviewed. The risks                            and benefits of the procedure and the sedation                            options and risks were discussed with the patient.                            All questions were answered, and informed consent                            was obtained. Prior Anticoagulants: The patient has                            taken no previous anticoagulant or antiplatelet                            agents. ASA Grade Assessment: II - A patient with                            mild systemic disease. After reviewing the risks                            and benefits, the patient was deemed in  satisfactory condition to undergo the procedure.                           After obtaining informed consent, the colonoscope                            was passed under direct vision. Throughout the                            procedure, the patient's blood pressure, pulse, and                            oxygen saturations were monitored continuously. The                            Colonoscope was introduced through  the anus and                            advanced to the terminal ileum, cecum was                            identified by appendiceal orifice and ileocecal                            valve. The colonoscopy was performed without                            difficulty. The patient tolerated the procedure                            well. The quality of the bowel preparation was good. Scope In: 1:44:35 PM Scope Out: 2:00:53 PM Scope Withdrawal Time: 0 hours 12 minutes 14 seconds  Total Procedure Duration: 0 hours 16 minutes 18 seconds  Findings:                 The digital rectal exam was normal.                           The terminal ileum appeared normal.                           The entire examined colon appeared normal on direct                            and retroflexion views. Complications:            No immediate complications. Estimated Blood Loss:     Estimated blood loss: none. Impression:               - The examined portion of the ileum was normal.                           - The entire examined colon is normal on direct and  retroflexion views.                           - No specimens collected. Recommendation:           - Patient has a contact number available for                            emergencies. The signs and symptoms of potential                            delayed complications were discussed with the                            patient. Return to normal activities tomorrow.                            Written discharge instructions were provided to the                            patient.                           - Resume previous diet.                           - Continue present medications.                           - Repeat colonoscopy in 3 - 5 years for screening                            purposes. Jerene Bears, MD 11/03/2018 2:07:10 PM This report has been signed electronically.

## 2018-11-03 NOTE — Progress Notes (Signed)
To PACU, VSS. Report to [email or web address] 

## 2018-11-04 ENCOUNTER — Telehealth: Payer: Self-pay | Admitting: Internal Medicine

## 2018-11-04 NOTE — Telephone Encounter (Signed)
Called patient back, she said she woke up this morning with stomach cramps and wanted to make sure that was normal.  She started walking around the house and was able to pass gas and cramping has now subsided.  She also had questions about her procedure report saying" ASA Grade Assessment II." Explained to the patient the meaning and no further questions at this time.

## 2018-11-04 NOTE — Telephone Encounter (Signed)
Patient had a procedure yesterday and has a couple of questions.

## 2018-11-07 ENCOUNTER — Telehealth: Payer: Self-pay | Admitting: *Deleted

## 2018-11-07 NOTE — Telephone Encounter (Signed)
  Follow up Call-  Call back number 11/03/2018  Post procedure Call Back phone  # [phone]  Permission to leave phone message Yes  Some recent data might be hidden     Patient questions: 1. Have you developed a fever since your procedure? no  2.   Have you had an respiratory symptoms (SOB or cough) since your procedure? no  3.   Have you tested positive for COVID 19 since your procedure no  4.   Have you had any family members/close contacts diagnosed with the COVID 19 since your procedure?  no   If yes to any of these questions please route to Joylene John, RN and Alphonsa Gin, Therapist, sports.  Do you have a fever, pain , or abdominal swelling? No. Pain Score  0 *  Have you tolerated food without any problems? Yes.    Have you been able to return to your normal activities? Yes.    Do you have any questions about your discharge instructions: Diet   No. Medications  No. Follow up visit  No.  Do you have questions or concerns about your Care? No.  Actions: * If pain score is 4 or above: No action needed, pain <4.

## 2018-11-24 DIAGNOSIS — L820 Inflamed seborrheic keratosis: Secondary | ICD-10-CM | POA: Diagnosis not present

## 2018-11-24 DIAGNOSIS — R58 Hemorrhage, not elsewhere classified: Secondary | ICD-10-CM | POA: Diagnosis not present

## 2018-11-24 DIAGNOSIS — D2272 Melanocytic nevi of left lower limb, including hip: Secondary | ICD-10-CM | POA: Diagnosis not present

## 2018-11-24 DIAGNOSIS — D2262 Melanocytic nevi of left upper limb, including shoulder: Secondary | ICD-10-CM | POA: Diagnosis not present

## 2018-11-24 DIAGNOSIS — L658 Other specified nonscarring hair loss: Secondary | ICD-10-CM | POA: Diagnosis not present

## 2018-11-24 DIAGNOSIS — D2271 Melanocytic nevi of right lower limb, including hip: Secondary | ICD-10-CM | POA: Diagnosis not present

## 2018-11-24 DIAGNOSIS — D2261 Melanocytic nevi of right upper limb, including shoulder: Secondary | ICD-10-CM | POA: Diagnosis not present

## 2018-11-24 DIAGNOSIS — L538 Other specified erythematous conditions: Secondary | ICD-10-CM | POA: Diagnosis not present

## 2018-11-24 DIAGNOSIS — D225 Melanocytic nevi of trunk: Secondary | ICD-10-CM | POA: Diagnosis not present

## 2018-11-24 DIAGNOSIS — L821 Other seborrheic keratosis: Secondary | ICD-10-CM | POA: Diagnosis not present

## 2018-12-10 DIAGNOSIS — M67959 Unspecified disorder of synovium and tendon, unspecified thigh: Secondary | ICD-10-CM | POA: Insufficient documentation

## 2018-12-10 DIAGNOSIS — M6790 Unspecified disorder of synovium and tendon, unspecified site: Secondary | ICD-10-CM | POA: Insufficient documentation

## 2018-12-23 NOTE — Telephone Encounter (Signed)
Pt left v/m at 4:30 pm that she had sent a non urgent medical question this afternoon and has not heard back yet. Pt request cb.

## 2018-12-28 ENCOUNTER — Encounter: Payer: Self-pay | Admitting: Family Medicine

## 2018-12-28 ENCOUNTER — Other Ambulatory Visit: Payer: Self-pay

## 2018-12-28 ENCOUNTER — Ambulatory Visit (INDEPENDENT_AMBULATORY_CARE_PROVIDER_SITE_OTHER): Payer: Medicare Other | Admitting: Family Medicine

## 2018-12-28 VITALS — BP 120/78 | HR 79 | Temp 98.0°F | Ht 64.5 in | Wt 145.5 lb

## 2018-12-28 DIAGNOSIS — M67952 Unspecified disorder of synovium and tendon, left thigh: Secondary | ICD-10-CM | POA: Diagnosis not present

## 2018-12-28 DIAGNOSIS — M7062 Trochanteric bursitis, left hip: Secondary | ICD-10-CM | POA: Diagnosis not present

## 2018-12-28 MED ORDER — PREDNISONE 20 MG PO TABS: ORAL_TABLET | ORAL | 0 refills | Status: DC

## 2018-12-28 NOTE — Progress Notes (Signed)
Rachael Mcglocklin T. Monice Lundy, MD Primary Care and [place] at [facility] [place], [address] Phone: [phone]  FAX: [phone]  Rachael Sharp - 68 y.o. female  MRN [id number]  Date of Birth: [birth year]  Visit Date: 12/28/2018  PCP: Jinny Sanders, MD  Referred by: Jinny Sanders, MD  Chief Complaint  Patient presents with  . Hip Pain    Left   Subjective:   Rachael Sharp is a 68 y.o. very pleasant female patient with Body mass index is 24.59 kg/m. 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  She says this has been ongoing for 7 years.  Sleeping on her left hip. 2013 onset. Was taking some alleve.   Appears always and painful.  Rare alleve.   Lateral hip pain. LEFT. No groin pain No groin pain.   July was pretty lazy. Returned to exercise a few weeks go.   L slightly higher PT helpful  More glute medius, L No inj  Past Medical History, Surgical History, Social History, Family History, Problem List, Medications, and Allergies have been reviewed and updated if relevant.  Patient Active Problem List   Diagnosis Date Noted  . Muscle strain of left upper back 07/01/2018  . Renal insufficiency 01/14/2018  . Prediabetes 01/08/2017  . Abnormal CBC 01/08/2017  . Mitral valve prolapse 11/19/2016  . Bilateral hip bursitis 11/19/2016  . Menopausal syndrome 11/19/2016  . 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 Breast mass in female 03/11/2015  . B12 deficiency 10/17/2013  . Polycythemia, secondary 03/13/2013  . 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 Heart murmur     Past Medical History:  Diagnosis Date  . Arthritis    finger left index  . Colon stricture ([place])   . GERD (gastroesophageal reflux disease)   . Heart murmur    mitral valve prolapse  . Hyperlipidemia    denies 10/21/18,not on medication  .  Hyperplastic colon polyp   . Monoallelic mutation of CHEK2 gene in female patient   . Vitamin D deficiency     Past Surgical History:  Procedure Laterality Date  . ABDOMINAL HYSTERECTOMY  August 2002  . BREAST BIOPSY     2002, benign right  . BREAST SURGERY  2002   biopsy x 2 , 1976  . COLONOSCOPY    . LIPOMA EXCISION  1983  . TONSILLECTOMY  1964  . TUBAL LIGATION  1982    Social History   Socioeconomic History  . Marital status: Married    Spouse name: Not on file  . Number of children: Not on file  . Years of education: Not on file  . Highest education level: Not on file  Occupational History  . Not on file  Social Needs  . Financial resource strain: Not on file  . Food insecurity    Worry: Not on file    Inability: Not on file  . Transportation needs    Medical: Not on file    Non-medical: Not on file  Tobacco Use  . Smoking status: Former Smoker    Types: Cigarettes    Quit date: 08/31/1989    Years since quitting: 29.3  . Smokeless tobacco: Never Used  . Tobacco comment: Quit 1991  Substance and Sexual Activity  . Alcohol use: Yes    Alcohol/week: 7.0 standard drinks    Types:  7 Shots of liquor per week    Comment: 0-14 DRINKS A WEEK   . Drug use: No  . Sexual activity: Not Currently    Partners: Male    Comment: 1st intercourse-19, partners- 11, married- 18 yr s  Lifestyle  . Physical activity    Days per week: Not on file    Minutes per session: Not on file  . Stress: Not on file  Relationships  . Social Herbalist on phone: Not on file    Gets together: Not on file    Attends religious service: Not on file    Active member of club or organization: Not on file    Attends meetings of clubs or organizations: Not on file    Relationship status: Not on file  . Intimate partner violence    Fear of current or ex partner: Not on file    Emotionally abused: Not on file    Physically abused: Not on file    Forced sexual activity: Not on file   Other Topics Concern  . Not on file  Social History Narrative  . Not on file    Family History  Problem Relation Age of Onset  . COPD Sister   . Heart disease Sister 69       massive CVA,    . Stroke Sister   . Stroke Mother   . COPD Mother   . Heart disease Father   . Hyperlipidemia Father   . Breast cancer Paternal Aunt   . Breast cancer Paternal Aunt   . Breast cancer Paternal Aunt   . Breast cancer Paternal Aunt   . Colon cancer Neg Hx   . Colon polyps Neg Hx   . Esophageal cancer Neg Hx   . Rectal cancer Neg Hx   . Stomach cancer Neg Hx     Allergies  Allergen Reactions  . Penicillins     REACTION: rash  . Epinephrine Palpitations    Medication list reviewed and updated in full in [place].  GEN: No fevers, chills. Nontoxic. Primarily MSK c/o today. MSK: Detailed in the HPI GI: tolerating PO intake without difficulty Neuro: No numbness, parasthesias, or tingling associated. Otherwise the pertinent positives of the ROS are noted above.   Objective:   BP 120/78   Pulse 79   Temp 98 F (36.7 C) (Temporal)   Ht 5' 4.5" (1.638 m)   Wt 145 lb 8 oz (66 kg)   SpO2 98%   BMI 24.59 kg/m    GEN: WDWN, NAD, Non-toxic, Alert & Oriented x 3 HEENT: Atraumatic, Normocephalic.  Ears and Nose: No external deformity. EXTR: No clubbing/cyanosis/edema NEURO: Normal gait.  PSYCH: Normally interactive. Conversant. Not depressed or anxious appearing.  Calm demeanor.   HIP EXAM: SIDE: L ROM: Abduction, Flexion, Internal and External range of motion: full Pain with terminal IROM and EROM: minimal GTB: mild Mostly tender at glute medius SLR: NEG Knees: No effusion FABER: NT REVERSE FABER: NT, neg Piriformis: NT at direct palpation Str: flexion: 4/5 abduction: 4/5 adduction: 4/5 Strength testing non-tender     Radiology: No results found.  Assessment and Plan:     ICD-10-CM   1. Trochanteric bursitis of left hip  M70.62 Ambulatory referral to  Physical Therapy  2. Tendinopathy of left gluteus medius  M67.952 Ambulatory referral to Physical Therapy   Mostly glute medius and minimus tendinopathy with lighter case of GTB. Pulse steroids, PT  Follow-up: Return in about  6 weeks (around 02/08/2019) for f/u hip.  Meds ordered this encounter  Medications  . predniSONE (DELTASONE) 20 MG tablet    Sig: 2 tabs po daily for 5 days, then 1 tab po daily for 5 days    Dispense:  15 tablet    Refill:  0   Orders Placed This Encounter  Procedures  . Ambulatory referral to Physical Therapy    Signed,  Frederico Hamman T. Leva Baine, MD   Outpatient Encounter Medications as of 12/28/2018  Medication Sig  . amitriptyline (ELAVIL) 10 MG tablet Take 3 tablets (30 mg total) by mouth at bedtime.  Marland Kitchen BIOTIN PO Take 5,000 mcg by mouth daily.  . Glucosamine HCl-MSM (GLUCOSAMINE-MSM PO) Take 1 tablet by mouth daily.  . Misc Natural Products (TART CHERRY ADVANCED) CAPS Take 3 capsules by mouth daily.   . Multiple Vitamin (MULTIVITAMIN) tablet Take 2 tablets by mouth daily.   . Omega-3 Fatty Acids (OMEGA 3 PO) Take by mouth.  . tretinoin (RETIN-A) 0.025 % cream Apply topically at bedtime.  . predniSONE (DELTASONE) 20 MG tablet 2 tabs po daily for 5 days, then 1 tab po daily for 5 days  . [DISCONTINUED] Black Cohosh 160 MG CAPS Take by mouth.  . [DISCONTINUED] cyclobenzaprine (FLEXERIL) 10 MG tablet Take 0.5-1 tablets (5-10 mg total) by mouth at bedtime as needed for muscle spasms. (Patient not taking: Reported on 10/21/2018)  . [DISCONTINUED] triamcinolone cream (KENALOG) 0.1 %    No facility-administered encounter medications on file as of 12/28/2018.

## 2019-01-03 ENCOUNTER — Encounter: Payer: Self-pay | Admitting: Family Medicine

## 2019-01-03 ENCOUNTER — Telehealth: Payer: Self-pay

## 2019-01-03 NOTE — Telephone Encounter (Signed)
Patient sent mychart message asking about her PT referral. I did reply to patient letting her know I would get one of the referral coordinators to look into this and that we are behind with referrals about 2 weeks worth and I apologized for the delay.

## 2019-01-11 DIAGNOSIS — M7062 Trochanteric bursitis, left hip: Secondary | ICD-10-CM | POA: Diagnosis not present

## 2019-01-11 DIAGNOSIS — M25552 Pain in left hip: Secondary | ICD-10-CM | POA: Diagnosis not present

## 2019-01-11 DIAGNOSIS — S76012D Strain of muscle, fascia and tendon of left hip, subsequent encounter: Secondary | ICD-10-CM | POA: Diagnosis not present

## 2019-01-14 ENCOUNTER — Telehealth: Payer: Self-pay | Admitting: Family Medicine

## 2019-01-14 DIAGNOSIS — E538 Deficiency of other specified B group vitamins: Secondary | ICD-10-CM

## 2019-01-14 DIAGNOSIS — Z13220 Encounter for screening for lipoid disorders: Secondary | ICD-10-CM

## 2019-01-14 DIAGNOSIS — R7303 Prediabetes: Secondary | ICD-10-CM

## 2019-01-14 DIAGNOSIS — R7989 Other specified abnormal findings of blood chemistry: Secondary | ICD-10-CM

## 2019-01-14 NOTE — Telephone Encounter (Signed)
-----   Message from Cloyd Stagers, RT sent at 01/10/2019  1:39 PM EDT ----- Regarding: Lab Orders for Friday 9.11.2020 Please place lab orders for Friday 9.11.2020, office visit for physical on Tuesday 9.15.2020 Thank you, Dyke Maes RT(R)

## 2019-01-18 DIAGNOSIS — S76012D Strain of muscle, fascia and tendon of left hip, subsequent encounter: Secondary | ICD-10-CM | POA: Diagnosis not present

## 2019-01-18 DIAGNOSIS — M7062 Trochanteric bursitis, left hip: Secondary | ICD-10-CM | POA: Diagnosis not present

## 2019-01-18 DIAGNOSIS — M25552 Pain in left hip: Secondary | ICD-10-CM | POA: Diagnosis not present

## 2019-01-20 ENCOUNTER — Other Ambulatory Visit (INDEPENDENT_AMBULATORY_CARE_PROVIDER_SITE_OTHER): Payer: Medicare Other

## 2019-01-20 ENCOUNTER — Ambulatory Visit: Payer: Medicare Other

## 2019-01-20 ENCOUNTER — Ambulatory Visit (INDEPENDENT_AMBULATORY_CARE_PROVIDER_SITE_OTHER): Payer: Medicare Other

## 2019-01-20 VITALS — Ht 65.0 in | Wt 142.0 lb

## 2019-01-20 DIAGNOSIS — Z0000 Encounter for general adult medical examination without abnormal findings: Secondary | ICD-10-CM

## 2019-01-20 DIAGNOSIS — E538 Deficiency of other specified B group vitamins: Secondary | ICD-10-CM | POA: Diagnosis not present

## 2019-01-20 DIAGNOSIS — R7989 Other specified abnormal findings of blood chemistry: Secondary | ICD-10-CM | POA: Diagnosis not present

## 2019-01-20 DIAGNOSIS — R7303 Prediabetes: Secondary | ICD-10-CM | POA: Diagnosis not present

## 2019-01-20 DIAGNOSIS — Z13220 Encounter for screening for lipoid disorders: Secondary | ICD-10-CM

## 2019-01-20 LAB — CBC WITH DIFFERENTIAL/PLATELET
Basophils Absolute: 0 10*3/uL (ref 0.0–0.1)
Basophils Relative: 0.7 % (ref 0.0–3.0)
Eosinophils Absolute: 0.1 10*3/uL (ref 0.0–0.7)
Eosinophils Relative: 1.8 % (ref 0.0–5.0)
HCT: 43.6 % (ref 36.0–46.0)
Hemoglobin: 14.6 g/dL (ref 12.0–15.0)
Lymphocytes Relative: 51.2 % — ABNORMAL HIGH (ref 12.0–46.0)
Lymphs Abs: 2.6 10*3/uL (ref 0.7–4.0)
MCHC: 33.6 g/dL (ref 30.0–36.0)
MCV: 94.1 fl (ref 78.0–100.0)
Monocytes Absolute: 0.4 10*3/uL (ref 0.1–1.0)
Monocytes Relative: 7 % (ref 3.0–12.0)
Neutro Abs: 2 10*3/uL (ref 1.4–7.7)
Neutrophils Relative %: 39.3 % — ABNORMAL LOW (ref 43.0–77.0)
Platelets: 263 10*3/uL (ref 150.0–400.0)
RBC: 4.64 Mil/uL (ref 3.87–5.11)
RDW: 14.9 % (ref 11.5–15.5)
WBC: 5.2 10*3/uL (ref 4.0–10.5)

## 2019-01-20 LAB — COMPREHENSIVE METABOLIC PANEL
ALT: 22 U/L (ref 0–35)
AST: 26 U/L (ref 0–37)
Albumin: 4.3 g/dL (ref 3.5–5.2)
Alkaline Phosphatase: 42 U/L (ref 39–117)
BUN: 18 mg/dL (ref 6–23)
CO2: 28 mEq/L (ref 19–32)
Calcium: 9.8 mg/dL (ref 8.4–10.5)
Chloride: 103 mEq/L (ref 96–112)
Creatinine, Ser: 1.09 mg/dL (ref 0.40–1.20)
GFR: 49.92 mL/min — ABNORMAL LOW (ref >60.00)
Glucose, Bld: 113 mg/dL — ABNORMAL HIGH (ref 70–99)
Potassium: 4.9 mEq/L (ref 3.5–5.1)
Sodium: 138 mEq/L (ref 135–145)
Total Bilirubin: 0.5 mg/dL (ref 0.2–1.2)
Total Protein: 6.5 g/dL (ref 6.0–8.3)

## 2019-01-20 LAB — LIPID PANEL
Cholesterol: 205 mg/dL — ABNORMAL HIGH (ref 0–200)
HDL: 78.3 mg/dL (ref >39.00)
LDL Cholesterol: 112 mg/dL — ABNORMAL HIGH (ref 0–99)
NonHDL: 126.46
Total CHOL/HDL Ratio: 3
Triglycerides: 73 mg/dL (ref 0.0–149.0)
VLDL: 14.6 mg/dL (ref 0.0–40.0)

## 2019-01-20 LAB — HEMOGLOBIN A1C: Hgb A1c MFr Bld: 6.1 % (ref 4.6–6.5)

## 2019-01-20 LAB — VITAMIN B12: Vitamin B-12: 608 pg/mL (ref 211–911)

## 2019-01-20 NOTE — Patient Instructions (Signed)
Rachael Sharp , Thank you for taking time to come for your Medicare Wellness Visit. I appreciate your ongoing commitment to your health goals. Please review the following plan we discussed and let me know if I can assist you in the future.   Screening recommendations/referrals: Colonoscopy: 10/2018 Mammogram: 09/2018 Bone Density: 02/2018 Recommended yearly ophthalmology/optometry visit for glaucoma screening and checkup Recommended yearly dental visit for hygiene and checkup  Vaccinations: Influenza vaccine: 01/2018 Pneumococcal vaccine: 03/2018 Tdap vaccine: 11/2010 Shingles vaccine: discussed    Advanced directives: Please bring a copy of your POA (Power of [place] [address]) and/or Living Will to your next appointment.    Conditions/risks identified: none  Next appointment: 01/24/2019 at 11:20   Preventive Care 65 Years and Older, Female Preventive care refers to lifestyle choices and visits with your health care provider that can promote health and wellness. What does preventive care include?  A yearly physical exam. This is also called an annual well check.  Dental exams once or twice a year.  Routine eye exams. Ask your health care provider how often you should have your eyes checked.  Personal lifestyle choices, including:  Daily care of your teeth and gums.  Regular physical activity.  Eating a healthy diet.  Avoiding tobacco and drug use.  Limiting alcohol use.  Practicing safe sex.  Taking low-dose aspirin every day.  Taking vitamin and mineral supplements as recommended by your health care provider. What happens during an annual well check? The services and screenings done by your health care provider during your annual well check will depend on your age, overall health, lifestyle risk factors, and family history of disease. Counseling  Your health care provider may ask you questions about your:  Alcohol use.  Tobacco use.  Drug use.  Emotional  well-being.  Home and relationship well-being.  Sexual activity.  Eating habits.  History of falls.  Memory and ability to understand (cognition).  Work and work Statistician.  Reproductive health. Screening  You may have the following tests or measurements:  Height, weight, and BMI.  Blood pressure.  Lipid and cholesterol levels. These may be checked every 5 years, or more frequently if you are over 93 years old.  Skin check.  Lung cancer screening. You may have this screening every year starting at age 19 if you have a 30-pack-year history of smoking and currently smoke or have quit within the past 15 years.  Fecal occult blood test (FOBT) of the stool. You may have this test every year starting at age 67.  Flexible sigmoidoscopy or colonoscopy. You may have a sigmoidoscopy every 5 years or a colonoscopy every 10 years starting at age 24.  Hepatitis C blood test.  Hepatitis B blood test.  Sexually transmitted disease (STD) testing.  Diabetes screening. This is done by checking your blood sugar (glucose) after you have not eaten for a while (fasting). You may have this done every 1-3 years.  Bone density scan. This is done to screen for osteoporosis. You may have this done starting at age 80.  Mammogram. This may be done every 1-2 years. Talk to your health care provider about how often you should have regular mammograms. Talk with your health care provider about your test results, treatment options, and if necessary, the need for more tests. Vaccines  Your health care provider may recommend certain vaccines, such as:  Influenza vaccine. This is recommended every year.  Tetanus, diphtheria, and acellular pertussis (Tdap, Td) vaccine. You may need a Td booster  every 10 years.  Zoster vaccine. You may need this after age 80.  Pneumococcal 13-valent conjugate (PCV13) vaccine. One dose is recommended after age 88.  Pneumococcal polysaccharide (PPSV23) vaccine. One  dose is recommended after age 62. Talk to your health care provider about which screenings and vaccines you need and how often you need them. This information is not intended to replace advice given to you by your health care provider. Make sure you discuss any questions you have with your health care provider. Document Released: 05/24/2015 Document Revised: 01/15/2016 Document Reviewed: 02/26/2015 Elsevier Interactive Patient Education  2017 [place] Prevention in the Home Falls can cause injuries. They can happen to people of all ages. There are many things you can do to make your home safe and to help prevent falls. What can I do on the outside of my home?  Regularly fix the edges of walkways and driveways and fix any cracks.  Remove anything that might make you trip as you walk through a door, such as a raised step or threshold.  Trim any bushes or trees on the path to your home.  Use bright outdoor lighting.  Clear any walking paths of anything that might make someone trip, such as rocks or tools.  Regularly check to see if handrails are loose or broken. Make sure that both sides of any steps have handrails.  Any raised decks and porches should have guardrails on the edges.  Have any leaves, snow, or ice cleared regularly.  Use sand or salt on walking paths during winter.  Clean up any spills in your garage right away. This includes oil or grease spills. What can I do in the bathroom?  Use night lights.  Install grab bars by the toilet and in the tub and shower. Do not use towel bars as grab bars.  Use non-skid mats or decals in the tub or shower.  If you need to sit down in the shower, use a plastic, non-slip stool.  Keep the floor dry. Clean up any water that spills on the floor as soon as it happens.  Remove soap buildup in the tub or shower regularly.  Attach bath mats securely with double-sided non-slip rug tape.  Do not have throw rugs and other  things on the floor that can make you trip. What can I do in the bedroom?  Use night lights.  Make sure that you have a light by your bed that is easy to reach.  Do not use any sheets or blankets that are too big for your bed. They should not hang down onto the floor.  Have a firm chair that has side arms. You can use this for support while you get dressed.  Do not have throw rugs and other things on the floor that can make you trip. What can I do in the kitchen?  Clean up any spills right away.  Avoid walking on wet floors.  Keep items that you use a lot in easy-to-reach places.  If you need to reach something above you, use a strong step stool that has a grab bar.  Keep electrical cords out of the way.  Do not use floor polish or wax that makes floors slippery. If you must use wax, use non-skid floor wax.  Do not have throw rugs and other things on the floor that can make you trip. What can I do with my stairs?  Do not leave any items on the stairs.  Make  sure that there are handrails on both sides of the stairs and use them. Fix handrails that are broken or loose. Make sure that handrails are as long as the stairways.  Check any carpeting to make sure that it is firmly attached to the stairs. Fix any carpet that is loose or worn.  Avoid having throw rugs at the top or bottom of the stairs. If you do have throw rugs, attach them to the floor with carpet tape.  Make sure that you have a light switch at the top of the stairs and the bottom of the stairs. If you do not have them, ask someone to add them for you. What else can I do to help prevent falls?  Wear shoes that:  Do not have high heels.  Have rubber bottoms.  Are comfortable and fit you well.  Are closed at the toe. Do not wear sandals.  If you use a stepladder:  Make sure that it is fully opened. Do not climb a closed stepladder.  Make sure that both sides of the stepladder are locked into place.  Ask  someone to hold it for you, if possible.  Clearly mark and make sure that you can see:  Any grab bars or handrails.  First and last steps.  Where the edge of each step is.  Use tools that help you move around (mobility aids) if they are needed. These include:  Canes.  Walkers.  Scooters.  Crutches.  Turn on the lights when you go into a dark area. Replace any light bulbs as soon as they burn out.  Set up your furniture so you have a clear path. Avoid moving your furniture around.  If any of your floors are uneven, fix them.  If there are any pets around you, be aware of where they are.  Review your medicines with your doctor. Some medicines can make you feel dizzy. This can increase your chance of falling. Ask your doctor what other things that you can do to help prevent falls. This information is not intended to replace advice given to you by your health care provider. Make sure you discuss any questions you have with your health care provider. Document Released: 02/21/2009 Document Revised: 10/03/2015 Document Reviewed: 06/01/2014 Elsevier Interactive Patient Education  2017 [employer].

## 2019-01-20 NOTE — Progress Notes (Signed)
 Subjective:   Rachael Sharp is a 67 y.o. female who presents for Medicare Annual (Subsequent) preventive examination.  This visit type was conducted due to national recommendations for restrictions regarding the COVID-19 Pandemic (e.g. social distancing). This format is felt to be most appropriate for this patient at this time. All issues noted in this document were discussed and addressed. No physical exam was performed (except for noted visual exam findings with Video Visits). This patient, Rachael Sharp, has given permission to perform this visit via telephone. Vital signs may be absent or patient reported.  Patient location:  At home  Nurse location:  At home     Review of Systems:  n/a Cardiac Risk Factors include: advanced age (>55men, >65 women)     Objective:     Vitals: Ht 5' 5" (1.651 m) Comment: per patient  Wt 142 lb (64.4 kg) Comment: per patient  BMI 23.63 kg/m   Body mass index is 23.63 kg/m.  Advanced Directives 01/20/2019 01/12/2018 02/10/2017 01/28/2017 01/08/2017 10/17/2015  Does Patient Have a Medical Advance Directive? Yes Yes Yes Yes Yes Yes  Type of Advance Directive Healthcare Power of Attorney;Living will Healthcare Power of Attorney;Living will Healthcare Power of Attorney;Living will Healthcare Power of Attorney;Living will Healthcare Power of Attorney;Living will -  Copy of Healthcare Power of Attorney in Chart? No - copy requested No - copy requested - - No - copy requested No - copy requested    Tobacco Social History   Tobacco Use  Smoking Status Former Smoker  . Types: Cigarettes  . Quit date: 08/31/1989  . Years since quitting: 29.4  Smokeless Tobacco Never Used  Tobacco Comment   Quit 1991     Counseling given: Not Answered Comment: Quit 1991   Clinical Intake:  Pre-visit preparation completed: Yes  Pain : 0-10 Pain Score: 2  Pain Type: Chronic pain Pain Location: Hip Pain Orientation: Left Pain Radiating Towards:  leg Pain Descriptors / Indicators: Aching Pain Onset: More than a month ago Pain Frequency: Constant Pain Relieving Factors: prednisone Effect of Pain on Daily Activities: painful going up stairs  Pain Relieving Factors: prednisone  Nutritional Status: BMI of 19-24  Normal Nutritional Risks: None Diabetes: No  How often do you need to have someone help you when you read instructions, pamphlets, or other written materials from your doctor or pharmacy?: 1 - Never What is the last grade level you completed in school?: some college  Interpreter Needed?: No  Information entered by :: NAllen LPN  Past Medical History:  Diagnosis Date  . Arthritis    finger left index  . Colon stricture (HCC)   . GERD (gastroesophageal reflux disease)   . Heart murmur    mitral valve prolapse  . Hyperlipidemia    denies 10/21/18,not on medication  . Hyperplastic colon polyp   . Monoallelic mutation of CHEK2 gene in female patient   . Vitamin D deficiency    Past Surgical History:  Procedure Laterality Date  . ABDOMINAL HYSTERECTOMY  August 2002  . BREAST BIOPSY     2002, benign right  . BREAST SURGERY  2002   biopsy x 2 , 1976  . COLONOSCOPY    . LIPOMA EXCISION  1983  . TONSILLECTOMY  1964  . TUBAL LIGATION  1982   Family History  Problem Relation Age of Onset  . COPD Sister   . Heart disease Sister 65       massive CVA,    . Stroke   Sister   . Stroke Mother   . COPD Mother   . Heart disease Father   . Hyperlipidemia Father   . Breast cancer Paternal Aunt   . Breast cancer Paternal Aunt   . Breast cancer Paternal Aunt   . Breast cancer Paternal Aunt   . Colon cancer Neg Hx   . Colon polyps Neg Hx   . Esophageal cancer Neg Hx   . Rectal cancer Neg Hx   . Stomach cancer Neg Hx    Social History   Socioeconomic History  . Marital status: Married    Spouse name: Not on file  . Number of children: Not on file  . Years of education: Not on file  . Highest education level:  Not on file  Occupational History  . Occupation: retired  Social Needs  . Financial resource strain: Not hard at all  . Food insecurity    Worry: Never true    Inability: Never true  . Transportation needs    Medical: No    Non-medical: No  Tobacco Use  . Smoking status: Former Smoker    Types: Cigarettes    Quit date: 08/31/1989    Years since quitting: 29.4  . Smokeless tobacco: Never Used  . Tobacco comment: Quit 1991  Substance and Sexual Activity  . Alcohol use: Yes    Alcohol/week: 7.0 standard drinks    Types: 7 Shots of liquor per week    Comment: 0-14 DRINKS A WEEK   . Drug use: No  . Sexual activity: Not Currently    Partners: Male    Comment: 1st intercourse-19, partners- 4, married- 39 yr s  Lifestyle  . Physical activity    Days per week: 4 days    Minutes per session: 60 min  . Stress: Not at all  Relationships  . Social connections    Talks on phone: Not on file    Gets together: Not on file    Attends religious service: Not on file    Active member of club or organization: Not on file    Attends meetings of clubs or organizations: Not on file    Relationship status: Not on file  Other Topics Concern  . Not on file  Social History Narrative  . Not on file    Outpatient Encounter Medications as of 01/20/2019  Medication Sig  . amitriptyline (ELAVIL) 10 MG tablet Take 3 tablets (30 mg total) by mouth at bedtime.  . BIOTIN PO Take 5,000 mcg by mouth daily.  . Black Cohosh-SoyIsoflav-Magnol (ESTROVEN MENOPAUSE RELIEF) CAPS Take by mouth daily.  . Glucosamine HCl-MSM (GLUCOSAMINE-MSM PO) Take 1 tablet by mouth daily.  . Misc Natural Products (TART CHERRY ADVANCED) CAPS Take 3 capsules by mouth daily.   . Multiple Vitamin (MULTIVITAMIN) tablet Take 2 tablets by mouth daily.   . Omega-3 Fatty Acids (OMEGA 3 PO) Take by mouth.  . tretinoin (RETIN-A) 0.025 % cream Apply topically at bedtime.  . predniSONE (DELTASONE) 20 MG tablet 2 tabs po daily for 5 days,  then 1 tab po daily for 5 days (Patient not taking: Reported on 01/20/2019)   No facility-administered encounter medications on file as of 01/20/2019.     Activities of Daily Living In your present state of health, do you have any difficulty performing the following activities: 01/20/2019  Hearing? N  Vision? N  Difficulty concentrating or making decisions? N  Walking or climbing stairs? N  Comment have pain going up stairs  Dressing   or bathing? N  Doing errands, shopping? N  Preparing Food and eating ? N  Using the Toilet? N  In the past six months, have you accidently leaked urine? N  Do you have problems with loss of bowel control? N  Managing your Medications? N  Managing your Finances? N  Housekeeping or managing your Housekeeping? N  Some recent data might be hidden    Patient Care Team: Jinny Sanders, MD as PCP - General (Family Medicine)    Assessment:   This is a routine wellness examination for Rachael Sharp.  Exercise Activities and Dietary recommendations Current Exercise Habits: Structured exercise class, Type of exercise: yoga;walking(physical therapy), Time (Minutes): 60, Frequency (Times/Week): 4, Weekly Exercise (Minutes/Week): 240  Goals    . Patient Stated     Starting 01/12/2018, I will continue to take medications as prescribed.     . Weight (lb) < 200 lb (90.7 kg)     01/20/2019, wants to lose 10 pounds       Fall Risk Fall Risk  01/20/2019 01/12/2018 01/08/2017 11/19/2016  Falls in the past year? 0 No No No  Risk for fall due to : Impaired balance/gait - - -  Risk for fall due to: Comment when in pain - - -  Follow up Falls evaluation completed;Falls prevention discussed - - -   Is the patient's home free of loose throw rugs in walkways, pet beds, electrical cords, etc?   yes      Grab bars in the bathroom? no      Handrails on the stairs?   yes      Adequate lighting?   yes  Timed Get Up and Go performed: n/a  Depression Screen PHQ 2/9 Scores 01/20/2019  01/12/2018 01/08/2017 11/19/2016  PHQ - 2 Score 0 0 0 0  PHQ- 9 Score 1 0 - -     Cognitive Function MMSE - Mini Mental State Exam 01/20/2019 01/12/2018  Orientation to time 5 5  Orientation to Place 5 5  Registration 3 3  Attention/ Calculation 5 0  Recall 3 3  Language- name 2 objects 0 0  Language- repeat 1 1  Language- follow 3 step command 0 3  Language- read & follow direction 0 0  Write a sentence 0 0  Copy design 0 0  Total score 22 20   Mini Cog  Mini-Cog screen was completed. Maximum score is 22. A value of 0 denotes this part of the MMSE was not completed or the patient failed this part of the Mini-Cog screening.       Immunization History  Administered Date(s) Administered  . Influenza Split 03/08/2012, 02/11/2014, 02/09/2015  . Influenza Whole 04/05/2011  . Influenza, High Dose Seasonal PF 02/25/2017, 01/11/2018  . Influenza,inj,Quad PF,6+ Mos 03/13/2013  . Influenza-Unspecified 01/30/2016  . Pneumococcal Conjugate-13 01/08/2017  . Pneumococcal Polysaccharide-23 03/17/2018  . Tdap 11/09/2010  . Zoster 03/08/2012    Qualifies for Shingles Vaccine? yes  Screening Tests Health Maintenance  Topic Date Due  . INFLUENZA VACCINE  12/10/2018  . MAMMOGRAM  09/26/2019  . TETANUS/TDAP  11/08/2020  . COLONOSCOPY  11/02/2021  . DEXA SCAN  Completed  . Hepatitis C Screening  Completed  . PNA vac Low Risk Adult  Completed    Cancer Screenings: Lung: Low Dose CT Chest recommended if Age 30-80 years, 30 pack-year currently smoking OR have quit w/in 15years. Patient does not qualify. Breast:  Up to date on Mammogram? Yes   Up to date  of Bone Density/Dexa? Yes Colorectal: up to date  Additional Screenings: : Hepatitis C Screening: 07/2015     Plan:    Patient would like to lose 10 pounds.   I have personally reviewed and noted the following in the patient's chart:   . Medical and social history . Use of alcohol, tobacco or illicit drugs  . Current medications  and supplements . Functional ability and status . Nutritional status . Physical activity . Advanced directives . List of other physicians . Hospitalizations, surgeries, and ER visits in previous 12 months . Vitals . Screenings to include cognitive, depression, and falls . Referrals and appointments  In addition, I have reviewed and discussed with patient certain preventive protocols, quality metrics, and best practice recommendations. A written personalized care plan for preventive services as well as general preventive health recommendations were provided to patient.     Kellie Simmering, LPN  8/83/[id number]

## 2019-01-20 NOTE — Progress Notes (Signed)
No critical labs need to be addressed urgently. We will discuss labs in detail at upcoming office visit.   

## 2019-01-20 NOTE — Progress Notes (Signed)
PCP notes:  Health Maintenance:  Flu vaccine is due.  Abnormal Screenings:  None  Patient concerns:  None  Nurse concerns:  None  Next PCP appt.: 01/24/2019 at 11:20

## 2019-01-24 ENCOUNTER — Encounter: Payer: Self-pay | Admitting: Family Medicine

## 2019-01-24 ENCOUNTER — Encounter: Payer: Medicare Other | Admitting: Family Medicine

## 2019-01-24 ENCOUNTER — Other Ambulatory Visit: Payer: Self-pay

## 2019-01-24 ENCOUNTER — Ambulatory Visit (INDEPENDENT_AMBULATORY_CARE_PROVIDER_SITE_OTHER): Payer: Medicare Other | Admitting: Family Medicine

## 2019-01-24 VITALS — BP 122/82 | HR 78 | Temp 98.4°F | Ht 64.5 in | Wt 144.0 lb

## 2019-01-24 DIAGNOSIS — Z1501 Genetic susceptibility to malignant neoplasm of breast: Secondary | ICD-10-CM | POA: Diagnosis not present

## 2019-01-24 DIAGNOSIS — Z1589 Genetic susceptibility to other disease: Secondary | ICD-10-CM

## 2019-01-24 DIAGNOSIS — Z1509 Genetic susceptibility to other malignant neoplasm: Secondary | ICD-10-CM

## 2019-01-24 DIAGNOSIS — Z1502 Genetic susceptibility to malignant neoplasm of ovary: Secondary | ICD-10-CM

## 2019-01-24 DIAGNOSIS — Z23 Encounter for immunization: Secondary | ICD-10-CM | POA: Diagnosis not present

## 2019-01-24 DIAGNOSIS — E538 Deficiency of other specified B group vitamins: Secondary | ICD-10-CM | POA: Diagnosis not present

## 2019-01-24 DIAGNOSIS — N289 Disorder of kidney and ureter, unspecified: Secondary | ICD-10-CM | POA: Diagnosis not present

## 2019-01-24 DIAGNOSIS — R7303 Prediabetes: Secondary | ICD-10-CM

## 2019-01-24 NOTE — Progress Notes (Signed)
Chief Complaint  Patient presents with  . Part 2 AWV  . Follow-up  . Flu Vaccine    History of Present Illness: HPI  The patient presents for complete physical and review of chronic health problems. He/She also has the following acute concerns today:none  The patient saw a LPN or RN for medicare wellness visit.  Prevention and wellness was reviewed in detail. Note reviewed and important notes copied below.  Health Maintenance:  Flu vaccine is due.  Abnormal Screenings:  None  Patient concerns: none, doing well overall.  B 12 def . Normal with supplement   prediabetes:  Lab Results  Component Value Date   HGBA1C 6.1 01/20/2019   Cholesterol:  Statin not indicated.Marland Kitchen CVD 10 year risk 5.7% Lab Results  Component Value Date   CHOL 205 (H) 01/20/2019   HDL 78.30 01/20/2019   LDLCALC 112 (H) 01/20/2019   LDLDIRECT 149.0 03/22/2013   TRIG 73.0 01/20/2019   CHOLHDL 3 01/20/2019   Diet compliance:good Exercise: walking 3 miles 4 days a week, PT daily for bursitis. Other complaints:  Renal insufficiency:  Stable, drinking a lot of water.  Vulvadynia: Dr. Criss Rosales retired, now seeing Dr. Dellis Filbert.. pain controlled with: amitriptyline 30 mg at bedtime.Marland Kitchen  COVID 19 screen No recent travel or known exposure to COVID19 The patient denies respiratory symptoms of COVID 19 at this time.  The importance of social distancing was discussed today.   Review of Systems  Constitutional: Negative for chills and fever.  HENT: Negative for congestion and ear pain.   Eyes: Negative for pain and redness.  Respiratory: Negative for cough and shortness of breath.   Cardiovascular: Negative for chest pain, palpitations and leg swelling.  Gastrointestinal: Negative for abdominal pain, blood in stool, constipation, diarrhea, nausea and vomiting.  Genitourinary: Negative for dysuria.  Musculoskeletal: Negative for falls and myalgias.  Skin: Negative for rash.  Neurological: Negative for  dizziness.  Psychiatric/Behavioral: Negative for depression. The patient is not nervous/anxious.       Past Medical History:  Diagnosis Date  . Arthritis    finger left index  . Colon stricture ([place])   . GERD (gastroesophageal reflux disease)   . Heart murmur    mitral valve prolapse  . Hyperlipidemia    denies 10/21/18,not on medication  . Hyperplastic colon polyp   . Monoallelic mutation of CHEK2 gene in female patient   . Vitamin D deficiency     reports that she quit smoking about 29 years ago. Her smoking use included cigarettes. She has never used smokeless tobacco. She reports current alcohol use of about 7.0 standard drinks of alcohol per week. She reports that she does not use drugs.   Current Outpatient Medications:  .  amitriptyline (ELAVIL) 10 MG tablet, Take 3 tablets (30 mg total) by mouth at bedtime., Disp: 270 tablet, Rfl: 3 .  BIOTIN PO, Take 5,000 mcg by mouth daily., Disp: , Rfl:  .  Black Cohosh-SoyIsoflav-Magnol (ESTROVEN MENOPAUSE RELIEF) CAPS, Take by mouth daily., Disp: , Rfl:  .  Glucosamine HCl-MSM (GLUCOSAMINE-MSM PO), Take 1 tablet by mouth daily., Disp: , Rfl:  .  Misc Natural Products (TART CHERRY ADVANCED) CAPS, Take 3 capsules by mouth daily. , Disp: , Rfl:  .  Multiple Vitamin (MULTIVITAMIN) tablet, Take 2 tablets by mouth daily. , Disp: , Rfl:  .  Omega-3 Fatty Acids (OMEGA 3 PO), Take by mouth., Disp: , Rfl:  .  tretinoin (RETIN-A) 0.025 % cream, Apply topically at bedtime., Disp: ,  Rfl:    Observations/Objective: Blood pressure 122/82, pulse 78, temperature 98.4 F (36.9 C), temperature source Temporal, height 5' 4.5" (1.638 m), weight 144 lb (65.3 kg), SpO2 98 %.  Physical Exam Constitutional:      General: She is not in acute distress.    Appearance: Normal appearance. She is well-developed. She is not ill-appearing or toxic-appearing.  HENT:     Head: Normocephalic.     Right Ear: Hearing, tympanic membrane, ear canal and external ear  normal.     Left Ear: Hearing, tympanic membrane, ear canal and external ear normal.     Nose: Nose normal.  Eyes:     General: Lids are normal. Lids are everted, no foreign bodies appreciated.     Conjunctiva/sclera: Conjunctivae normal.     Pupils: Pupils are equal, round, and reactive to light.  Neck:     Musculoskeletal: Normal range of motion and neck supple.     Thyroid: No thyroid mass or thyromegaly.     Vascular: No carotid bruit.     Trachea: Trachea normal.  Cardiovascular:     Rate and Rhythm: Normal rate and regular rhythm.     Heart sounds: Normal heart sounds, S1 normal and S2 normal. No murmur. No gallop.   Pulmonary:     Effort: Pulmonary effort is normal. No respiratory distress.     Breath sounds: Normal breath sounds. No wheezing, rhonchi or rales.  Abdominal:     General: Bowel sounds are normal. There is no distension or abdominal bruit.     Palpations: Abdomen is soft. There is no fluid wave or mass.     Tenderness: There is no abdominal tenderness. There is no guarding or rebound.     Hernia: No hernia is present.  Lymphadenopathy:     Cervical: No cervical adenopathy.  Skin:    General: Skin is warm and dry.     Findings: No rash.  Neurological:     Mental Status: She is alert.     Cranial Nerves: No cranial nerve deficit.     Sensory: No sensory deficit.  Psychiatric:        Mood and Affect: Mood is not anxious or depressed.        Speech: Speech normal.        Behavior: Behavior normal. Behavior is cooperative.        Judgment: Judgment normal.      Assessment and Plan   The patient's preventative maintenance and recommended screening tests for an annual wellness exam were reviewed in full today. Brought up to date unless services declined.  Counselled on the importance of diet, exercise, and its role in overall health and mortality. The patient's FH and SH was reviewed, including their home life, tobacco status, and drug and alcohol status.    Colonoscopy every 3 years. Dr Hilarie Fredrickson, last 10/2018 PAP/DVE not indicated,  TAH.  Now seeing GYN  Dr. Dellis Filbert. Vaccines:uptodate, flu due Hep C: done  Mammogram: Chek 2 mutation. Annual mam with alternating years breast MRI (next MRI 03/2018) . Needs breast exam twice a year. Mammogram 09/2018 Bone density: 02/2018 normal, repeat in 5 years.   B12 deficiency Well controlled. Continue current medication.   Prediabetes stable on low carb diet.  Renal insufficiency Stable .    Eliezer Lofts, MD

## 2019-01-24 NOTE — Patient Instructions (Addendum)
Work on low carbohydrate diet.  Continue healthy eating and regular exercise.

## 2019-01-25 DIAGNOSIS — M7062 Trochanteric bursitis, left hip: Secondary | ICD-10-CM | POA: Diagnosis not present

## 2019-01-25 DIAGNOSIS — S76012D Strain of muscle, fascia and tendon of left hip, subsequent encounter: Secondary | ICD-10-CM | POA: Diagnosis not present

## 2019-01-25 DIAGNOSIS — M25552 Pain in left hip: Secondary | ICD-10-CM | POA: Diagnosis not present

## 2019-01-26 ENCOUNTER — Encounter: Payer: Self-pay | Admitting: Family Medicine

## 2019-02-02 DIAGNOSIS — M7062 Trochanteric bursitis, left hip: Secondary | ICD-10-CM | POA: Diagnosis not present

## 2019-02-02 DIAGNOSIS — S76012D Strain of muscle, fascia and tendon of left hip, subsequent encounter: Secondary | ICD-10-CM | POA: Diagnosis not present

## 2019-02-02 DIAGNOSIS — M25552 Pain in left hip: Secondary | ICD-10-CM | POA: Diagnosis not present

## 2019-02-08 ENCOUNTER — Encounter: Payer: Self-pay | Admitting: Family Medicine

## 2019-02-08 ENCOUNTER — Other Ambulatory Visit: Payer: Self-pay

## 2019-02-08 ENCOUNTER — Ambulatory Visit (INDEPENDENT_AMBULATORY_CARE_PROVIDER_SITE_OTHER)
Admission: RE | Admit: 2019-02-08 | Discharge: 2019-02-08 | Disposition: A | Discharge status: Home / Self Care | Payer: Medicare Other | Source: Ambulatory Visit | Attending: Family Medicine | Admitting: Family Medicine

## 2019-02-08 ENCOUNTER — Ambulatory Visit (INDEPENDENT_AMBULATORY_CARE_PROVIDER_SITE_OTHER): Payer: Medicare Other | Admitting: Family Medicine

## 2019-02-08 VITALS — BP 110/80 | HR 78 | Temp 97.8°F | Ht 64.5 in | Wt 147.2 lb

## 2019-02-08 DIAGNOSIS — M1612 Unilateral primary osteoarthritis, left hip: Secondary | ICD-10-CM | POA: Diagnosis not present

## 2019-02-08 DIAGNOSIS — M67952 Unspecified disorder of synovium and tendon, left thigh: Secondary | ICD-10-CM

## 2019-02-08 DIAGNOSIS — M7062 Trochanteric bursitis, left hip: Secondary | ICD-10-CM

## 2019-02-08 MED ORDER — METHYLPREDNISOLONE ACETATE 40 MG/ML IJ SUSP
80.0000 mg | Freq: Once | INTRAMUSCULAR | Status: AC
  Administered 2019-02-08: 80 mg via INTRA_ARTICULAR

## 2019-02-08 NOTE — Progress Notes (Signed)
Rachael Sharp T. Kortnee Bas, MD Primary Care and [place] at [facility] [place], [address] Phone: [phone]  FAX: [phone]  Rachael Sharp - 68 y.o. female  MRN [id number]  Date of Birth: [birth year]  Visit Date: 02/08/2019  PCP: Jinny Sanders, MD  Referred by: Jinny Sanders, MD  Chief Complaint  Patient presents with  . Follow-up    Left Hip   Subjective:   Rachael Sharp is a 68 y.o. very pleasant female patient with Body mass index is 24.89 kg/m. who presents with the following:  F/u GTB and glute med and min weakness and tendinopathy.   Maybe 10% better.  Then started PT and then got worse.  Going to ? PT. She actually thinks that she may have gotten worse when she was doing physical therapy.  Some of the exercises hurt quite a bit, and also her quite a bit she is doing some ITB work with a wand.  I reviewed her rehab exercises myself, and a lot of them are quite advanced particular for her age and level of strength and pain.  Inj L GTB  Past Medical History, Surgical History, Social History, Family History, Problem List, Medications, and Allergies have been reviewed and updated if relevant.  Patient Active Problem List   Diagnosis Date Noted  . Muscle strain of left upper back 07/01/2018  . Renal insufficiency 01/14/2018  . Prediabetes 01/08/2017  . Abnormal CBC 01/08/2017  . Mitral valve prolapse 11/19/2016  . Bilateral hip bursitis 11/19/2016  . Menopausal syndrome 11/19/2016  . 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 Breast mass in female 03/11/2015  . B12 deficiency 10/17/2013  . Polycythemia, secondary 03/13/2013  . 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 Heart murmur     Past Medical History:  Diagnosis Date  . Arthritis    finger left index  . Colon stricture ([place])   . GERD (gastroesophageal reflux disease)   . Heart murmur    mitral valve  prolapse  . Hyperlipidemia    denies 10/21/18,not on medication  . Hyperplastic colon polyp   . Monoallelic mutation of CHEK2 gene in female patient   . Vitamin D deficiency     Past Surgical History:  Procedure Laterality Date  . ABDOMINAL HYSTERECTOMY  August 2002  . BREAST BIOPSY     2002, benign right  . BREAST SURGERY  2002   biopsy x 2 , 1976  . COLONOSCOPY    . LIPOMA EXCISION  1983  . TONSILLECTOMY  1964  . TUBAL LIGATION  1982    Social History   Socioeconomic History  . Marital status: Married    Spouse name: Not on file  . Number of children: Not on file  . Years of education: Not on file  . Highest education level: Not on file  Occupational History  . Occupation: retired  Scientific laboratory technician  . Financial resource strain: Not hard at all  . Food insecurity    Worry: Never true    Inability: Never true  . Transportation needs    Medical: No    Non-medical: No  Tobacco Use  . Smoking status: Former Smoker    Types: Cigarettes    Quit date: 08/31/1989    Years since quitting: 29.4  . Smokeless tobacco: Never Used  . Tobacco comment: Quit 1991  Substance and Sexual Activity  . Alcohol use: Yes    Alcohol/week:  7.0 standard drinks    Types: 7 Shots of liquor per week    Comment: 0-14 DRINKS A WEEK   . Drug use: No  . Sexual activity: Not Currently    Partners: Male    Comment: 1st intercourse-19, partners- 76, married- 59 yr s  Lifestyle  . Physical activity    Days per week: 4 days    Minutes per session: 60 min  . Stress: Not at all  Relationships  . Social Herbalist on phone: Not on file    Gets together: Not on file    Attends religious service: Not on file    Active member of club or organization: Not on file    Attends meetings of clubs or organizations: Not on file    Relationship status: Not on file  . Intimate partner violence    Fear of current or ex partner: No    Emotionally abused: No    Physically abused: No    Forced sexual  activity: No  Other Topics Concern  . Not on file  Social History Narrative  . Not on file    Family History  Problem Relation Age of Onset  . COPD Sister   . Heart disease Sister 61       massive CVA,    . Stroke Sister   . Stroke Mother   . COPD Mother   . Heart disease Father   . Hyperlipidemia Father   . Breast cancer Paternal Aunt   . Breast cancer Paternal Aunt   . Breast cancer Paternal Aunt   . Breast cancer Paternal Aunt   . Colon cancer Neg Hx   . Colon polyps Neg Hx   . Esophageal cancer Neg Hx   . Rectal cancer Neg Hx   . Stomach cancer Neg Hx     Allergies  Allergen Reactions  . Penicillins     REACTION: rash  . Epinephrine Palpitations    Medication list reviewed and updated in full in [place].  GEN: No fevers, chills. Nontoxic. Primarily MSK c/o today. MSK: Detailed in the HPI GI: tolerating PO intake without difficulty Neuro: No numbness, parasthesias, or tingling associated. Otherwise the pertinent positives of the ROS are noted above.   Objective:   BP 110/80   Pulse 78   Temp 97.8 F (36.6 C) (Temporal)   Ht 5' 4.5" (1.638 m)   Wt 147 lb 4 oz (66.8 kg)   SpO2 99%   BMI 24.89 kg/m    GEN: WDWN, NAD, Non-toxic, Alert & Oriented x 3 HEENT: Atraumatic, Normocephalic.  Ears and Nose: No external deformity. EXTR: No clubbing/cyanosis/edema NEURO: Normal gait.  PSYCH: Normally interactive. Conversant. Not depressed or anxious appearing.  Calm demeanor.    Full range of motion at both hips.  No pain with terminal motion.  No groin pain.  Flexibility at the hamstring and hip is reasonably okay.  On the left hip patient is notably tender at the trochanteric bursa.  She also has some significant tenderness on the left-sided pelvic rim.  Strength is 5/5 in all directions, notably improved compared to prior exam.  Radiology: Dg Hip Unilat With Pelvis 2-3 Views Left  Result Date: 02/08/2019 CLINICAL DATA:  Left hip pain EXAM: DG HIP  (WITH OR WITHOUT PELVIS) 2-3V LEFT COMPARISON:  None. FINDINGS: There is no acute displaced fracture or dislocation. There are mild degenerative changes of the left hip. Phleboliths project over the left hemipelvis. There are  degenerative changes of the partially visualized lower lumbar spine. IMPRESSION: Mild degenerative changes of the left hip. No acute displaced fracture or dislocation. Electronically Signed   By: Constance Holster M.D.   On: 02/08/2019 10:10   Assessment and Plan:     ICD-10-CM   1. Trochanteric bursitis of left hip  M70.62 DG HIP UNILAT WITH PELVIS 2-3 VIEWS LEFT    methylPREDNISolone acetate (DEPO-MEDROL) injection 80 mg  2. Tendinopathy of left gluteus medius  M67.952 DG HIP UNILAT WITH PELVIS 2-3 VIEWS LEFT   I think actually that the most tender areas are tendinopathy of the glue medius.  As well as minimus.  The patient was quite nervous about doing a injection today, so I decided to do only 1 and have her follow-up in a few weeks.  I think that the trochanteric bursa injection is much easier, some in a do this first.  Back down off of her physical therapy, go to physical therapy and home rehab only.  Continue to work on flexibility and yoga if possible.  Aspiration/Injection Procedure Note Rachael Sharp 1950-11-02 Date of procedure: 02/08/2019  Procedure: Large Joint Aspiration / Injection of Hip, Trochanteric Bursa, L Indications: Pain  Procedure Details Verbal consent obtained. Risks (including infection, potential atrophy), benefits, and alternatives reviewed. Greater trochanter sterilely prepped with Chloraprep. Ethyl Chloride used for anesthesia. 8 cc of Lidocaine 1% injected with 2 mL of Depo-Medrol 40 mg into trochanteric bursa at area of maximal tenderness at greater trochanter. Needle taken to bone to troch bursa, flows easily. Bursa massaged. No bleeding and no complications. Decreased pain after injection. Needle: 22 gauge spinal needle  Medication: 2 mL of Depo-Medrol 40 mg, equaling Depo-Medrol 80 mg total   Follow-up: Return for 2nd injection in the next 2-3 weeks.  Meds ordered this encounter  Medications  . methylPREDNISolone acetate (DEPO-MEDROL) injection 80 mg   Orders Placed This Encounter  Procedures  . DG HIP UNILAT WITH PELVIS 2-3 VIEWS LEFT    Signed,  Franciso Dierks T. Kory Rains, MD   Outpatient Encounter Medications as of 02/08/2019  Medication Sig  . amitriptyline (ELAVIL) 10 MG tablet Take 3 tablets (30 mg total) by mouth at bedtime.  Marland Kitchen aspirin 81 MG EC tablet Take 1 tablet by mouth daily.  Marland Kitchen BIOTIN PO Take 5,000 mcg by mouth daily.  . Black Cohosh-SoyIsoflav-Magnol (ESTROVEN MENOPAUSE RELIEF) CAPS Take by mouth daily.  . Glucosamine HCl-MSM (GLUCOSAMINE-MSM PO) Take 1 tablet by mouth daily.  . Misc Natural Products (TART CHERRY ADVANCED) CAPS Take 3 capsules by mouth daily.   . Multiple Vitamin (MULTIVITAMIN) tablet Take 2 tablets by mouth daily.   . Nutritional Supplements (ESTROVEN WEIGHT MANAGEMENT PO) Take 1 capsule by mouth daily.  . Omega-3 Fatty Acids (OMEGA 3 PO) Take by mouth.  . tretinoin (RETIN-A) 0.025 % cream Apply topically at bedtime.  . [EXPIRED] methylPREDNISolone acetate (DEPO-MEDROL) injection 80 mg    No facility-administered encounter medications on file as of 02/08/2019.

## 2019-02-09 ENCOUNTER — Encounter: Payer: Self-pay | Admitting: Family Medicine

## 2019-02-22 ENCOUNTER — Ambulatory Visit: Payer: Medicare Other | Admitting: Family Medicine

## 2019-02-23 ENCOUNTER — Encounter: Payer: Self-pay | Admitting: Family Medicine

## 2019-02-23 NOTE — Assessment & Plan Note (Signed)
stable on low carb diet.

## 2019-02-23 NOTE — Assessment & Plan Note (Signed)
Well controlled. Continue current medication.  

## 2019-02-23 NOTE — Assessment & Plan Note (Signed)
Stable

## 2019-02-28 NOTE — Progress Notes (Signed)
Rachael Bonaparte T. Giannah Zavadil, MD Primary Care and [place] at [facility] [place], [address] Phone: [phone]  FAX: [phone]  Rachael Sharp - 68 y.o. female  MRN [id number]  Date of Birth: 21-Oct-1950  Visit Date: 03/01/2019  PCP: Jinny Sanders, MD  Referred by: Jinny Sanders, MD  Chief Complaint  Patient presents with  . Hip Pain    Pain in Progressing   Subjective:   Rachael Sharp is a 68 y.o. very pleasant female patient with Body mass index is 25.31 kg/m. who presents with the following:  F/u for l gluteus medius injection. Pain is progressing per patient.   In actuality, the patient's trochanteric bursitis is much improved and her pain near the gluteus minimus and minimus distal insertion is much improved as well.  Nevertheless, she is still having some persistent pain that she rates at 7 out of 10 maximally.  She is able to perform her yoga, and she is apparently walking without any kind significant limp.  She has been doing some physical therapy, and has been working out with this daily.  02/08/2019 Last OV with Owens Loffler, MD F/u GTB and glute med and min weakness and tendinopathy.   Maybe 10% better.  Then started PT and then got worse.  Going to ? PT. She actually thinks that she may have gotten worse when she was doing physical therapy.  Some of the exercises hurt quite a bit, and also her quite a bit she is doing some ITB work with a wand.  I reviewed her rehab exercises myself, and a lot of them are quite advanced particular for her age and level of strength and pain.  Inj L GTB  Past Medical History, Surgical History, Social History, Family History, Problem List, Medications, and Allergies have been reviewed and updated if relevant.  Patient Active Problem List   Diagnosis Date Noted  . Tendinopathy involving hip 12/10/2018  . Renal insufficiency 01/14/2018  . Prediabetes  01/08/2017  . Mitral valve prolapse 11/19/2016  . Bilateral hip bursitis 11/19/2016  . Menopausal syndrome 11/19/2016  . 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 Heart murmur     Past Medical History:  Diagnosis Date  . Arthritis    finger left index  . Colon stricture ([place])   . GERD (gastroesophageal reflux disease)   . Heart murmur    mitral valve prolapse  . Hyperlipidemia    denies 10/21/18,not on medication  . Hyperplastic colon polyp   . Monoallelic mutation of CHEK2 gene in female patient   . Vitamin D deficiency     Past Surgical History:  Procedure Laterality Date  . ABDOMINAL HYSTERECTOMY  August 2002  . BREAST BIOPSY     2002, benign right  . BREAST SURGERY  2002   biopsy x 2 , 1976  . COLONOSCOPY    . LIPOMA EXCISION  1983  . TONSILLECTOMY  1964  . TUBAL LIGATION  1982    Social History   Socioeconomic History  . Marital status: Married    Spouse name: Not on file  . Number of children: Not on file  . Years of education: Not on file  . Highest education level: Not on file  Occupational History  . Occupation: retired  Scientific laboratory technician  . Financial resource strain: Not hard at all  . Food insecurity    Worry:  Never true    Inability: Never true  . Transportation needs    Medical: No    Non-medical: No  Tobacco Use  . Smoking status: Former Smoker    Types: Cigarettes    Quit date: 08/31/1989    Years since quitting: 29.5  . Smokeless tobacco: Never Used  . Tobacco comment: Quit 1991  Substance and Sexual Activity  . Alcohol use: Yes    Alcohol/week: 7.0 standard drinks    Types: 7 Shots of liquor per week    Comment: 0-14 DRINKS A WEEK   . Drug use: No  . Sexual activity: Not Currently    Partners: Male    Comment: 1st intercourse-19, partners- 39, married- 29 yr s  Lifestyle  . Physical activity    Days per week: 4 days    Minutes per session: 60 min  . Stress: Not at  all  Relationships  . Social Herbalist on phone: Not on file    Gets together: Not on file    Attends religious service: Not on file    Active member of club or organization: Not on file    Attends meetings of clubs or organizations: Not on file    Relationship status: Not on file  . Intimate partner violence    Fear of current or ex partner: No    Emotionally abused: No    Physically abused: No    Forced sexual activity: No  Other Topics Concern  . Not on file  Social History Narrative  . Not on file    Family History  Problem Relation Age of Onset  . COPD Sister   . Heart disease Sister 57       massive CVA,    . Stroke Sister   . Stroke Mother   . COPD Mother   . Heart disease Father   . Hyperlipidemia Father   . Breast cancer Paternal Aunt   . Breast cancer Paternal Aunt   . Breast cancer Paternal Aunt   . Breast cancer Paternal Aunt   . Colon cancer Neg Hx   . Colon polyps Neg Hx   . Esophageal cancer Neg Hx   . Rectal cancer Neg Hx   . Stomach cancer Neg Hx     Allergies  Allergen Reactions  . Penicillins     REACTION: rash  . Epinephrine Palpitations    Medication list reviewed and updated in full in [place].  GEN: No fevers, chills. Nontoxic. Primarily MSK c/o today. MSK: Detailed in the HPI GI: tolerating PO intake without difficulty Neuro: No numbness, parasthesias, or tingling associated. Otherwise the pertinent positives of the ROS are noted above.   Objective:   BP 110/82   Pulse 76   Temp 97.7 F (36.5 C) (Temporal)   Ht 5' 4.5" (1.638 m)   Wt 149 lb 12 oz (67.9 kg)   SpO2 98%   BMI 25.31 kg/m    GEN: WDWN, NAD, Non-toxic, Alert & Oriented x 3 HEENT: Atraumatic, Normocephalic.  Ears and Nose: No external deformity. EXTR: No clubbing/cyanosis/edema NEURO: Normal gait.  PSYCH: Normally interactive. Conversant. Not depressed or anxious appearing.  Calm demeanor.   HIP EXAM: SIDE: L ROM: Abduction, Flexion,  Internal and External range of motion: full Pain with terminal IROM and EROM: nt GTB: NT SLR: NEG Knees: No effusion FABER: NT REVERSE FABER: NT, neg Piriformis: NT at direct palpation Str: flexion: 5/5 abduction: 5/5 adduction: 5/5 Strength testing non-tender  Radiology: Dg Hip Unilat With Pelvis 2-3 Views Left  Result Date: 02/08/2019 CLINICAL DATA:  Left hip pain EXAM: DG HIP (WITH OR WITHOUT PELVIS) 2-3V LEFT COMPARISON:  None. FINDINGS: There is no acute displaced fracture or dislocation. There are mild degenerative changes of the left hip. Phleboliths project over the left hemipelvis. There are degenerative changes of the partially visualized lower lumbar spine. IMPRESSION: Mild degenerative changes of the left hip. No acute displaced fracture or dislocation. Electronically Signed   By: Constance Holster M.D.   On: 02/08/2019 10:10    Assessment and Plan:     ICD-10-CM   1. Trochanteric bursitis of left hip  M70.62   2. Tendinopathy of left gluteus medius  M67.952   3. Pain in joint of left hip  M25.552    I reviewed her hip films with her again face-to-face.  She has no evidence of fracture, dislocation, and there is only mild joint space narrowing on the left hip. Electronically Signed  By: Owens Loffler, MD On: 03/01/2019 10:40 AM EDT   Her exam is fairly benign today.  Previous pain is resolved.  Nevertheless she still has some significant pain.  She has a negative hop test and is walking pretty comfortably in the hallway.  I am get a pulse her again with some steroids for 14 days, and I will altered her rehab program, numbness see her back in a month.  Follow-up: Return in about 1 month (around 04/01/2019).  Meds ordered this encounter  Medications  . predniSONE (DELTASONE) 20 MG tablet    Sig: 2 tabs po for 7 days, then 1 tab po for 7 days    Dispense:  21 tablet    Refill:  0   No orders of the defined types were placed in this encounter.   Signed,   Maud Deed. Daschel Roughton, MD   Outpatient Encounter Medications as of 03/01/2019  Medication Sig  . amitriptyline (ELAVIL) 10 MG tablet Take 3 tablets (30 mg total) by mouth at bedtime.  Marland Kitchen aspirin 81 MG EC tablet Take 1 tablet by mouth daily.  Marland Kitchen BIOTIN PO Take 5,000 mcg by mouth daily.  . Black Cohosh-SoyIsoflav-Magnol (ESTROVEN MENOPAUSE RELIEF) CAPS Take by mouth daily.  . Glucosamine HCl-MSM (GLUCOSAMINE-MSM PO) Take 1 tablet by mouth daily.  . Misc Natural Products (TART CHERRY ADVANCED) CAPS Take 3 capsules by mouth daily.   . Multiple Vitamin (MULTIVITAMIN) tablet Take 2 tablets by mouth daily.   . Nutritional Supplements (ESTROVEN WEIGHT MANAGEMENT PO) Take 1 capsule by mouth daily.  . Omega-3 Fatty Acids (OMEGA 3 PO) Take 600 mg by mouth daily.   Marland Kitchen tretinoin (RETIN-A) 0.025 % cream Apply topically at bedtime.  . predniSONE (DELTASONE) 20 MG tablet 2 tabs po for 7 days, then 1 tab po for 7 days   No facility-administered encounter medications on file as of 03/01/2019.

## 2019-03-01 ENCOUNTER — Encounter: Payer: Self-pay | Admitting: Family Medicine

## 2019-03-01 ENCOUNTER — Ambulatory Visit (INDEPENDENT_AMBULATORY_CARE_PROVIDER_SITE_OTHER): Payer: Medicare Other | Admitting: Family Medicine

## 2019-03-01 ENCOUNTER — Other Ambulatory Visit: Payer: Self-pay

## 2019-03-01 VITALS — BP 110/82 | HR 76 | Temp 97.7°F | Ht 64.5 in | Wt 149.8 lb

## 2019-03-01 DIAGNOSIS — M7062 Trochanteric bursitis, left hip: Secondary | ICD-10-CM | POA: Diagnosis not present

## 2019-03-01 DIAGNOSIS — M25552 Pain in left hip: Secondary | ICD-10-CM

## 2019-03-01 DIAGNOSIS — M67952 Unspecified disorder of synovium and tendon, left thigh: Secondary | ICD-10-CM | POA: Diagnosis not present

## 2019-03-01 MED ORDER — PREDNISONE 20 MG PO TABS: ORAL_TABLET | ORAL | 0 refills | Status: DC

## 2019-03-01 NOTE — Patient Instructions (Signed)
Take your steroids for 2 weeks.  I want you to decrease your PT to only every other day for 1 week.  I want to see you again in 1 month.

## 2019-03-23 ENCOUNTER — Ambulatory Visit
Admission: RE | Admit: 2019-03-23 | Discharge: 2019-03-23 | Disposition: A | Discharge status: Home / Self Care | Payer: Medicare Other | Source: Ambulatory Visit | Attending: Obstetrics & Gynecology | Admitting: Obstetrics & Gynecology

## 2019-03-23 ENCOUNTER — Other Ambulatory Visit: Payer: Self-pay

## 2019-03-23 DIAGNOSIS — N6320 Unspecified lump in the left breast, unspecified quadrant: Secondary | ICD-10-CM | POA: Diagnosis not present

## 2019-03-23 DIAGNOSIS — Z1589 Genetic susceptibility to other disease: Secondary | ICD-10-CM

## 2019-03-23 MED ORDER — GADOBUTROL 1 MMOL/ML IV SOLN
7.0000 mL | Freq: Once | INTRAVENOUS | Status: AC | PRN
  Administered 2019-03-23: 11:00:00 7 mL via INTRAVENOUS

## 2019-03-29 ENCOUNTER — Telehealth: Payer: Self-pay | Admitting: *Deleted

## 2019-03-29 ENCOUNTER — Encounter: Payer: Self-pay | Admitting: Family Medicine

## 2019-03-29 ENCOUNTER — Other Ambulatory Visit: Payer: Self-pay

## 2019-03-29 ENCOUNTER — Ambulatory Visit (INDEPENDENT_AMBULATORY_CARE_PROVIDER_SITE_OTHER): Payer: Medicare Other | Admitting: Family Medicine

## 2019-03-29 VITALS — BP 110/72 | HR 73 | Temp 97.2°F | Ht 64.5 in | Wt 150.5 lb

## 2019-03-29 DIAGNOSIS — M8430XA Stress fracture, unspecified site, initial encounter for fracture: Secondary | ICD-10-CM

## 2019-03-29 DIAGNOSIS — M25552 Pain in left hip: Secondary | ICD-10-CM

## 2019-03-29 NOTE — Progress Notes (Signed)
Rachael Gervase T. [name], MD Primary Care and [place] at [facility] [place], [address] Phone: [phone]  FAX: [phone]  Rachael Sharp - 67 y.o. female  MRN [id number]  Date of Birth: [birth year]  Visit Date: 03/29/2019  PCP: Jinny Sanders, MD  Referred by: Jinny Sanders, MD  Chief Complaint  Patient presents with  . Follow-up    Left Hip   Subjective:   Rachael Sharp is a 68 y.o. very pleasant female patient with Body mass index is 25.43 kg/m. who presents with the following:  Rachael Sharp is a well-known patient, and she presents with ongoing left-sided hip pain.  I have seen her and her symptoms have been various and different depending on the time that I have seen her.  Initially she was having more trochanteric bursitis, and I injected this with corticosteroid and she improved quite a bit.  She also is having some gluteus medius and gluteus minimus insertional tendinopathy, but this is completely resolved.  She also was having some sacral pain, but this is also completely resolved.  About 2 weeks ago when she was stretching and tightening as she does normally in the morning she felt some significant pain in the groin and since that time that she has been having some anterior hip pain.  This is gotten worse over time  + hop test.    03/01/2019 Last OV with Owens Loffler, MD  Rachael Sharp is a 68 y.o. very pleasant female patient with Body mass index is 25.31 kg/m. who presents with the following:   F/u for l gluteus medius injection. Pain is progressing per patient.    In actuality, the patient's trochanteric bursitis is much improved and her pain near the gluteus minimus and minimus distal insertion is much improved as well.  Nevertheless, she is still having some persistent pain that she rates at 7 out of 10 maximally.  She is able to perform her yoga, and she is apparently walking without  any kind significant limp.   She has been doing some physical therapy, and has been working out with this daily.   02/08/2019 Last OV with Owens Loffler, MD F/u GTB and glute med and min weakness and tendinopathy.    Maybe 10% better.  Then started PT and then got worse.  Going to ? PT. She actually thinks that she may have gotten worse when she was doing physical therapy.  Some of the exercises hurt quite a bit, and also her quite a bit she is doing some ITB work with a wand.   I reviewed her rehab exercises myself, and a lot of them are quite advanced particular for her age and level of strength and pain.   Inj L GTB   Past Medical History, Surgical History, Social History, Family History, Problem List, Medications, and Allergies have been reviewed and updated if relevant.  Patient Active Problem List   Diagnosis Date Noted  . Tendinopathy involving hip 12/10/2018  . Renal insufficiency 01/14/2018  . Prediabetes 01/08/2017  . Mitral valve prolapse 11/19/2016  . Bilateral hip bursitis 11/19/2016  . Menopausal syndrome 11/19/2016  . 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 Heart murmur     Past Medical History:  Diagnosis Date  . Arthritis    finger left index  . Colon stricture ([place])   . GERD (gastroesophageal reflux disease)   .  Heart murmur    mitral valve prolapse  . Hyperlipidemia    denies 10/21/18,not on medication  . Hyperplastic colon polyp   . Monoallelic mutation of CHEK2 gene in female patient   . Vitamin D deficiency     Past Surgical History:  Procedure Laterality Date  . ABDOMINAL HYSTERECTOMY  August 2002  . BREAST BIOPSY     2002, benign right  . BREAST SURGERY  2002   biopsy x 2 , 1976  . COLONOSCOPY    . LIPOMA EXCISION  1983  . TONSILLECTOMY  1964  . TUBAL LIGATION  1982    Social History   Socioeconomic History  . Marital status: Married    Spouse name: Not on file   . Number of children: Not on file  . Years of education: Not on file  . Highest education level: Not on file  Occupational History  . Occupation: retired  Scientific laboratory technician  . Financial resource strain: Not hard at all  . Food insecurity    Worry: Never true    Inability: Never true  . Transportation needs    Medical: No    Non-medical: No  Tobacco Use  . Smoking status: Former Smoker    Types: Cigarettes    Quit date: 08/31/1989    Years since quitting: 29.5  . Smokeless tobacco: Never Used  . Tobacco comment: Quit 1991  Substance and Sexual Activity  . Alcohol use: Yes    Alcohol/week: 7.0 standard drinks    Types: 7 Shots of liquor per week    Comment: 0-14 DRINKS A WEEK   . Drug use: No  . Sexual activity: Not Currently    Partners: Male    Comment: 1st intercourse-19, partners- 19, married- 74 yr s  Lifestyle  . Physical activity    Days per week: 4 days    Minutes per session: 60 min  . Stress: Not at all  Relationships  . Social Herbalist on phone: Not on file    Gets together: Not on file    Attends religious service: Not on file    Active member of club or organization: Not on file    Attends meetings of clubs or organizations: Not on file    Relationship status: Not on file  . Intimate partner violence    Fear of current or ex partner: No    Emotionally abused: No    Physically abused: No    Forced sexual activity: No  Other Topics Concern  . Not on file  Social History Narrative  . Not on file    Family History  Problem Relation Age of Onset  . COPD Sister   . Heart disease Sister 26       massive CVA,    . Stroke Sister   . Stroke Mother   . COPD Mother   . Heart disease Father   . Hyperlipidemia Father   . Breast cancer Paternal Aunt   . Breast cancer Paternal Aunt   . Breast cancer Paternal Aunt   . Breast cancer Paternal Aunt   . Colon cancer Neg Hx   . Colon polyps Neg Hx   . Esophageal cancer Neg Hx   . Rectal cancer Neg Hx    . Stomach cancer Neg Hx     Allergies  Allergen Reactions  . Penicillins     REACTION: rash  . Epinephrine Palpitations    Medication list reviewed and updated in full in  [facility].  GEN: No fevers, chills. Nontoxic. Primarily MSK c/o today. MSK: Detailed in the HPI GI: tolerating PO intake without difficulty Neuro: No numbness, parasthesias, or tingling associated. Otherwise the pertinent positives of the ROS are noted above.   Objective:   BP 110/72   Pulse 73   Temp (!) 97.2 F (36.2 C) (Temporal)   Ht 5' 4.5" (1.638 m)   Wt 150 lb 8 oz (68.3 kg)   SpO2 100%   BMI 25.43 kg/m    GEN: WDWN, NAD, Non-toxic, Alert & Oriented x 3 HEENT: Atraumatic, Normocephalic.  Ears and Nose: No external deformity. EXTR: No clubbing/cyanosis/edema NEURO: Normal gait.  PSYCH: Normally interactive. Conversant. Not depressed or anxious appearing.  Calm demeanor.   She is able to walk without count of a significant limp, but I had her do a 2 legged hop test and this provokes minimal pain on the left side, the one done on one leg is caused some significant pain with the jump.  This is in the landing face.  Range of motion is grossly preserved on the left and strength is 5/5 in all directions. She is not having any significant trochanteric bursa pain. She is not having any significant tenderness in the posterior aspect of her pelvis or the sacrum.  The FADIR was mildly positive.  Corky Sox is mildly positive.  This portion of the physical examination was chaperoned by Hedy Camara, CMA.  The anterior pelvis was assessed as well, and the pubic symphysis is nontender.  Along the left side of the inferior pubic ramus, there is some significant tenderness with palpation on the bone.  Radiology: CLINICAL DATA:  Left hip pain  EXAM: DG HIP (WITH OR WITHOUT PELVIS) 2-3V LEFT  COMPARISON:  None.  FINDINGS: There is no acute displaced fracture or dislocation. There are mild  degenerative changes of the left hip. Phleboliths project over the left hemipelvis. There are degenerative changes of the partially visualized lower lumbar spine.  IMPRESSION: Mild degenerative changes of the left hip. No acute displaced fracture or dislocation.   Electronically Signed   By: Constance Holster M.D.   On: 02/08/2019 10:10     Assessment and Plan:     ICD-10-CM   1. Pain in joint of left hip  M25.552 MR PELVIS WO CONTRAST  2. Stress reaction of bone  M84.30XA    Level of Medical Decision-Making in this case is Moderate.   Clinical concern for possible inferior ramus stress reaction versus stress fracture.  May be occult fracture.  Positive hop test and pain at pubic ramus on exam.  Femoral neck stress fracture would be positive but would be much less likely than above.  Partial versus full-thickness tear of and insertional tendon would also be possible.  Obtain an MRI of the pelvis without contrast to evaluate for such.  I consulted with musculoskeletal radiology to confirm this is the film of choice in this matter.  At this time I recommended that she do training and do no aggravating factors such as walking, jumping, or any other physical exercise at all until MRI is returned.  Follow-up: No follow-ups on file.  No orders of the defined types were placed in this encounter.  Orders Placed This Encounter  Procedures  . MR PELVIS WO CONTRAST    Signed,  Oran Dillenburg T. Eudora Guevarra, MD   Outpatient Encounter Medications as of 03/29/2019  Medication Sig  . amitriptyline (ELAVIL) 10 MG tablet Take 3 tablets (30 mg total)  by mouth at bedtime.  Marland Kitchen aspirin 81 MG EC tablet Take 1 tablet by mouth daily.  Marland Kitchen BIOTIN PO Take 5,000 mcg by mouth daily.  . Glucosamine HCl-MSM (GLUCOSAMINE-MSM PO) Take 1 tablet by mouth daily.  . Misc Natural Products (TART CHERRY ADVANCED) CAPS Take 3 capsules by mouth daily.   . Multiple Vitamin (MULTIVITAMIN) tablet Take 2 tablets by  mouth daily.   . Nutritional Supplements (ESTROVEN WEIGHT MANAGEMENT PO) Take 1 capsule by mouth daily.  . Omega-3 Fatty Acids (OMEGA 3 PO) Take 600 mg by mouth daily.   Marland Kitchen tretinoin (RETIN-A) 0.025 % cream Apply topically at bedtime.  . [DISCONTINUED] Black Cohosh-SoyIsoflav-Magnol (ESTROVEN MENOPAUSE RELIEF) CAPS Take by mouth daily.  . [DISCONTINUED] predniSONE (DELTASONE) 20 MG tablet 2 tabs po for 7 days, then 1 tab po for 7 days   No facility-administered encounter medications on file as of 03/29/2019.

## 2019-03-29 NOTE — Telephone Encounter (Signed)
Patient called saw Mri breast results on my chart "Correlate enhancing skin lesion of the medial left breast with clinical exam of the skin".  Patient asked what should she do based off recommendation? Please advise

## 2019-03-29 NOTE — Telephone Encounter (Signed)
Urgent referral to Dermatologist.

## 2019-03-30 ENCOUNTER — Encounter: Payer: Self-pay | Admitting: Family Medicine

## 2019-03-30 NOTE — Telephone Encounter (Signed)
Patient informed, patient has dermatologist Dr. Karle Barr at [phone]. I called and left message with nurse to call me to see how to proceed.

## 2019-03-31 NOTE — Telephone Encounter (Signed)
Nurse called back and said fax results and patient can call and schedule. Patient informed.

## 2019-04-04 DIAGNOSIS — L821 Other seborrheic keratosis: Secondary | ICD-10-CM | POA: Diagnosis not present

## 2019-04-04 DIAGNOSIS — D485 Neoplasm of uncertain behavior of skin: Secondary | ICD-10-CM | POA: Diagnosis not present

## 2019-04-15 ENCOUNTER — Other Ambulatory Visit: Payer: Self-pay

## 2019-04-15 ENCOUNTER — Ambulatory Visit
Admission: RE | Admit: 2019-04-15 | Discharge: 2019-04-15 | Disposition: A | Discharge status: Home / Self Care | Payer: Medicare Other | Source: Ambulatory Visit | Attending: Family Medicine | Admitting: Family Medicine

## 2019-04-15 DIAGNOSIS — M25552 Pain in left hip: Secondary | ICD-10-CM | POA: Diagnosis not present

## 2019-04-15 DIAGNOSIS — M1611 Unilateral primary osteoarthritis, right hip: Secondary | ICD-10-CM | POA: Diagnosis not present

## 2019-04-17 ENCOUNTER — Encounter: Payer: Self-pay | Admitting: Family Medicine

## 2019-05-10 DIAGNOSIS — M9904 Segmental and somatic dysfunction of sacral region: Secondary | ICD-10-CM | POA: Diagnosis not present

## 2019-05-10 DIAGNOSIS — M5418 Radiculopathy, sacral and sacrococcygeal region: Secondary | ICD-10-CM | POA: Diagnosis not present

## 2019-05-10 DIAGNOSIS — M62830 Muscle spasm of back: Secondary | ICD-10-CM | POA: Diagnosis not present

## 2019-05-10 DIAGNOSIS — M9903 Segmental and somatic dysfunction of lumbar region: Secondary | ICD-10-CM | POA: Diagnosis not present

## 2019-05-10 DIAGNOSIS — M545 Low back pain: Secondary | ICD-10-CM | POA: Diagnosis not present

## 2019-05-11 DIAGNOSIS — M9904 Segmental and somatic dysfunction of sacral region: Secondary | ICD-10-CM | POA: Diagnosis not present

## 2019-05-11 DIAGNOSIS — M62830 Muscle spasm of back: Secondary | ICD-10-CM | POA: Diagnosis not present

## 2019-05-11 DIAGNOSIS — M545 Low back pain: Secondary | ICD-10-CM | POA: Diagnosis not present

## 2019-05-11 DIAGNOSIS — M9903 Segmental and somatic dysfunction of lumbar region: Secondary | ICD-10-CM | POA: Diagnosis not present

## 2019-05-11 DIAGNOSIS — M5418 Radiculopathy, sacral and sacrococcygeal region: Secondary | ICD-10-CM | POA: Diagnosis not present

## 2019-05-15 DIAGNOSIS — M5418 Radiculopathy, sacral and sacrococcygeal region: Secondary | ICD-10-CM | POA: Diagnosis not present

## 2019-05-15 DIAGNOSIS — M9904 Segmental and somatic dysfunction of sacral region: Secondary | ICD-10-CM | POA: Diagnosis not present

## 2019-05-15 DIAGNOSIS — M9903 Segmental and somatic dysfunction of lumbar region: Secondary | ICD-10-CM | POA: Diagnosis not present

## 2019-05-15 DIAGNOSIS — M545 Low back pain: Secondary | ICD-10-CM | POA: Diagnosis not present

## 2019-05-15 DIAGNOSIS — M62830 Muscle spasm of back: Secondary | ICD-10-CM | POA: Diagnosis not present

## 2019-05-16 DIAGNOSIS — M545 Low back pain: Secondary | ICD-10-CM | POA: Diagnosis not present

## 2019-05-16 DIAGNOSIS — M9903 Segmental and somatic dysfunction of lumbar region: Secondary | ICD-10-CM | POA: Diagnosis not present

## 2019-05-16 DIAGNOSIS — M9904 Segmental and somatic dysfunction of sacral region: Secondary | ICD-10-CM | POA: Diagnosis not present

## 2019-05-16 DIAGNOSIS — M5418 Radiculopathy, sacral and sacrococcygeal region: Secondary | ICD-10-CM | POA: Diagnosis not present

## 2019-05-16 DIAGNOSIS — M62830 Muscle spasm of back: Secondary | ICD-10-CM | POA: Diagnosis not present

## 2019-05-18 ENCOUNTER — Telehealth: Payer: Self-pay

## 2019-05-18 DIAGNOSIS — M62830 Muscle spasm of back: Secondary | ICD-10-CM | POA: Diagnosis not present

## 2019-05-18 DIAGNOSIS — M9903 Segmental and somatic dysfunction of lumbar region: Secondary | ICD-10-CM | POA: Diagnosis not present

## 2019-05-18 DIAGNOSIS — M5418 Radiculopathy, sacral and sacrococcygeal region: Secondary | ICD-10-CM | POA: Diagnosis not present

## 2019-05-18 DIAGNOSIS — M545 Low back pain: Secondary | ICD-10-CM | POA: Diagnosis not present

## 2019-05-18 DIAGNOSIS — M9904 Segmental and somatic dysfunction of sacral region: Secondary | ICD-10-CM | POA: Diagnosis not present

## 2019-05-18 NOTE — Telephone Encounter (Signed)
Pt said in last 2 wks pt has been seen at chiropractor's office 5 times and BP ranging at these visits 139/84 - 171/94.does not know heart rate. Pt said she is in no more back pain than she has been in the last 6 months. Pt does not have BP cough at home but pt is going to get cuff. Pt is not on any BP meds. Pt has no covid symptoms, no travel and no known exposure to + covid. No CP, SOB,H/A or dizziness. Pt said she feels good. Pt scheduled in office appt with Dr Diona Browner 05/18/18 at 3:20. If there is no inclement weather on 05/18/18 pt will call [facility] to see if can be seen sooner. If inclement weather pt is aware if office closes she will receive a call from front office about appt. Pt is going to increase fluid intake, rest and UC & ED precautions given and pt voiced understanding. FYI to Dr Diona Browner.

## 2019-05-19 ENCOUNTER — Other Ambulatory Visit: Payer: Self-pay

## 2019-05-19 ENCOUNTER — Ambulatory Visit: Payer: Medicare Other | Admitting: Family Medicine

## 2019-05-19 ENCOUNTER — Ambulatory Visit (INDEPENDENT_AMBULATORY_CARE_PROVIDER_SITE_OTHER): Payer: Medicare Other | Admitting: Family Medicine

## 2019-05-19 ENCOUNTER — Encounter: Payer: Self-pay | Admitting: Family Medicine

## 2019-05-19 VITALS — BP 140/80 | HR 91 | Temp 98.4°F | Ht 64.5 in | Wt 152.0 lb

## 2019-05-19 DIAGNOSIS — Z8349 Family history of other endocrine, nutritional and metabolic diseases: Secondary | ICD-10-CM | POA: Diagnosis not present

## 2019-05-19 DIAGNOSIS — R030 Elevated blood-pressure reading, without diagnosis of hypertension: Secondary | ICD-10-CM | POA: Diagnosis not present

## 2019-05-19 LAB — POC URINALSYSI DIPSTICK (AUTOMATED)
Bilirubin, UA: NEGATIVE
Blood, UA: NEGATIVE
Glucose, UA: NEGATIVE
Ketones, UA: NEGATIVE
Leukocytes, UA: NEGATIVE
Nitrite, UA: NEGATIVE
Protein, UA: NEGATIVE
Spec Grav, UA: 1.02 (ref 1.010–1.025)
Urobilinogen, UA: 0.2 E.U./dL
pH, UA: 6 (ref 5.0–8.0)

## 2019-05-19 LAB — CBC WITH DIFFERENTIAL/PLATELET
Basophils Absolute: 0 10*3/uL (ref 0.0–0.1)
Basophils Relative: 0.7 % (ref 0.0–3.0)
Eosinophils Absolute: 0 10*3/uL (ref 0.0–0.7)
Eosinophils Relative: 0.7 % (ref 0.0–5.0)
HCT: 45.9 % (ref 36.0–46.0)
Hemoglobin: 15.4 g/dL — ABNORMAL HIGH (ref 12.0–15.0)
Lymphocytes Relative: 43.4 % (ref 12.0–46.0)
Lymphs Abs: 2.7 10*3/uL (ref 0.7–4.0)
MCHC: 33.4 g/dL (ref 30.0–36.0)
MCV: 94.4 fl (ref 78.0–100.0)
Monocytes Absolute: 0.4 10*3/uL (ref 0.1–1.0)
Monocytes Relative: 6.4 % (ref 3.0–12.0)
Neutro Abs: 3.1 10*3/uL (ref 1.4–7.7)
Neutrophils Relative %: 48.8 % (ref 43.0–77.0)
Platelets: 291 10*3/uL (ref 150.0–400.0)
RBC: 4.87 Mil/uL (ref 3.87–5.11)
RDW: 14.7 % (ref 11.5–15.5)
WBC: 6.3 10*3/uL (ref 4.0–10.5)

## 2019-05-19 LAB — COMPREHENSIVE METABOLIC PANEL
ALT: 38 U/L — ABNORMAL HIGH (ref 0–35)
AST: 37 U/L (ref 0–37)
Albumin: 4.9 g/dL (ref 3.5–5.2)
Alkaline Phosphatase: 50 U/L (ref 39–117)
BUN: 18 mg/dL (ref 6–23)
CO2: 29 mEq/L (ref 19–32)
Calcium: 11.2 mg/dL — ABNORMAL HIGH (ref 8.4–10.5)
Chloride: 101 mEq/L (ref 96–112)
Creatinine, Ser: 1.06 mg/dL (ref 0.40–1.20)
GFR: 51.5 mL/min — ABNORMAL LOW (ref >60.00)
Glucose, Bld: 102 mg/dL — ABNORMAL HIGH (ref 70–99)
Potassium: 5.2 mEq/L — ABNORMAL HIGH (ref 3.5–5.1)
Sodium: 139 mEq/L (ref 135–145)
Total Bilirubin: 0.6 mg/dL (ref 0.2–1.2)
Total Protein: 7.4 g/dL (ref 6.0–8.3)

## 2019-05-19 LAB — TSH: TSH: 2.19 u[IU]/mL (ref 0.35–4.50)

## 2019-05-19 NOTE — Patient Instructions (Signed)
Follow Blood pressure at home once daily. Goal < 140/90. Call or Mychart results in next 1-2 weeks. Lower salt in diet, can increase potassium containing foods.  Get back to regular exercise, walking 3-5 days a week.

## 2019-05-19 NOTE — Assessment & Plan Note (Signed)
Likely new diagnosis of HTN, will get home cuff and  Check.. call with measurements.  Will eval for end organ damage, secondary causes... labs, UA and EKG.  Decrease salt, caffeine, stress and get back to regular exercise.

## 2019-05-19 NOTE — Progress Notes (Signed)
Chief Complaint  Patient presents with  . Hypertension    History of Present Illness: HPI   69 year old female with mitral valve prolapse presents with new onset elevated blood pressures.  She report sin last 2 weeks she has been seen at chiropractors 5 times and BP has been ranging 139-171/84-94 She has been more flushed lately.   She is not on any new medication or supplements.  no headahce, no dizziness. Chest pain with exertion: none Edema:none Short of breath: none Average home BPs: does not have home monitor.  She has ordered a monitor. Other issues:  BP Readings from Last 3 Encounters:  05/19/19 140/80  03/29/19 110/72  03/01/19 110/82     She has not able exercise as she was. Some weight gain. She has been using more salt and ETOH lately. Former smoker.. quit 30 year s ago. Not sure if family history of HTN.. mother and sister with CVA and were smokers. She is more anxious lately. Wt Readings from Last 3 Encounters:  05/19/19 152 lb (68.9 kg)  03/29/19 150 lb 8 oz (68.3 kg)  03/01/19 149 lb 12 oz (67.9 kg)      Controlled pain, not severe form back.   This visit occurred during the SARS-CoV-2 public health emergency.  Safety protocols were in place, including screening questions prior to the visit, additional usage of staff PPE, and extensive cleaning of exam room while observing appropriate contact time as indicated for disinfecting solutions.   COVID 19 screen:  No recent travel or known exposure to COVID19 The patient denies respiratory symptoms of COVID 19 at this time. The importance of social distancing was discussed today.     Review of Systems  Constitutional: Negative for chills and fever.  HENT: Negative for congestion and ear pain.   Eyes: Negative for pain and redness.  Respiratory: Negative for cough and shortness of breath.   Cardiovascular: Negative for chest pain, palpitations and leg swelling.  Gastrointestinal: Negative for  abdominal pain, blood in stool, constipation, diarrhea, nausea and vomiting.  Genitourinary: Negative for dysuria.  Musculoskeletal: Positive for back pain. Negative for falls and myalgias.  Skin: Negative for rash.  Neurological: Negative for dizziness.  Psychiatric/Behavioral: Negative for depression. The patient is not nervous/anxious.       Past Medical History:  Diagnosis Date  . Arthritis    finger left index  . 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 GERD (gastroesophageal reflux disease)   . Heart murmur    mitral valve prolapse  . Hyperlipidemia    denies 10/21/18,not on medication  . Hyperplastic colon polyp   . Monoallelic mutation of CHEK2 gene in female patient   . Vitamin D deficiency     reports that she quit smoking about 29 years ago. Her smoking use included cigarettes. She has never used smokeless tobacco. She reports current alcohol use of about 7.0 standard drinks of alcohol per week. She reports that she does not use drugs.   Current Outpatient Medications:  .  amitriptyline (ELAVIL) 10 MG tablet, Take 3 tablets (30 mg total) by mouth at bedtime., Disp: 270 tablet, Rfl: 3 .  aspirin 81 MG EC tablet, Take 1 tablet by mouth daily., Disp: , Rfl:  .  BIOTIN PO, Take 5,000 mcg by mouth daily., Disp: , Rfl:  .  Glucosamine HCl-MSM (GLUCOSAMINE-MSM PO), Take 1 tablet by mouth daily., Disp: , Rfl:  .  Multiple Vitamin (MULTIVITAMIN) tablet, Take 2 tablets by mouth daily. , Disp: ,  Rfl:  .  Nutritional Supplements (ESTROVEN WEIGHT MANAGEMENT PO), Take 1 capsule by mouth daily., Disp: , Rfl:  .  Omega-3 Fatty Acids (OMEGA 3 PO), Take 600 mg by mouth daily. , Disp: , Rfl:  .  tretinoin (RETIN-A) 0.025 % cream, Apply topically at bedtime., Disp: , Rfl:    Observations/Objective: Blood pressure 140/80, pulse 91, temperature 98.4 F (36.9 C), temperature source Temporal, height 5' 4.5" (1.638 m), weight 152 lb (68.9 kg), SpO2 99 %.  Physical Exam Constitutional:      General: She is  not in acute distress.    Appearance: Normal appearance. She is well-developed. She is not ill-appearing or toxic-appearing.  HENT:     Head: Normocephalic.     Right Ear: Hearing, tympanic membrane, ear canal and external ear normal. Tympanic membrane is not erythematous, retracted or bulging.     Left Ear: Hearing, tympanic membrane, ear canal and external ear normal. Tympanic membrane is not erythematous, retracted or bulging.     Nose: No mucosal edema or rhinorrhea.     Right Sinus: No maxillary sinus tenderness or frontal sinus tenderness.     Left Sinus: No maxillary sinus tenderness or frontal sinus tenderness.     Mouth/Throat:     Pharynx: Uvula midline.  Eyes:     General: Lids are normal. Lids are everted, no foreign bodies appreciated.     Conjunctiva/sclera: Conjunctivae normal.     Pupils: Pupils are equal, round, and reactive to light.  Neck:     Thyroid: No thyroid mass or thyromegaly.     Vascular: No carotid bruit.     Trachea: Trachea normal.  Cardiovascular:     Rate and Rhythm: Normal rate and regular rhythm.     Pulses: Normal pulses.     Heart sounds: Normal heart sounds, S1 normal and S2 normal. No murmur. No friction rub. No gallop.   Pulmonary:     Effort: Pulmonary effort is normal. No tachypnea or respiratory distress.     Breath sounds: Normal breath sounds. No decreased breath sounds, wheezing, rhonchi or rales.  Abdominal:     General: Bowel sounds are normal.     Palpations: Abdomen is soft.     Tenderness: There is no abdominal tenderness.  Musculoskeletal:     Cervical back: Normal range of motion and neck supple.  Skin:    General: Skin is warm and dry.     Findings: No rash.  Neurological:     Mental Status: She is alert.  Psychiatric:        Mood and Affect: Mood is not anxious or depressed. Affect is tearful.        Speech: Speech normal.        Behavior: Behavior normal. Behavior is cooperative.        Thought Content: Thought content  normal.        Judgment: Judgment normal.     Comments: Tearful when talking about elevated blood pressure      Assessment and Plan   Elevated blood pressure reading in office without diagnosis of hypertension Likely new diagnosis of HTN, will get home cuff and  Check.. call with measurements.  Will eval for end organ damage, secondary causes... labs, UA and EKG.  Decrease salt, caffeine, stress and get back to regular exercise.  EKG: normal EKG, normal sinus rhythm, unchanged from previous tracings, ? stable Q wave.  UA clear.. no protein.   Eliezer Lofts, MD

## 2019-05-22 DIAGNOSIS — M5418 Radiculopathy, sacral and sacrococcygeal region: Secondary | ICD-10-CM | POA: Diagnosis not present

## 2019-05-22 DIAGNOSIS — M62830 Muscle spasm of back: Secondary | ICD-10-CM | POA: Diagnosis not present

## 2019-05-22 DIAGNOSIS — M9903 Segmental and somatic dysfunction of lumbar region: Secondary | ICD-10-CM | POA: Diagnosis not present

## 2019-05-22 DIAGNOSIS — M9904 Segmental and somatic dysfunction of sacral region: Secondary | ICD-10-CM | POA: Diagnosis not present

## 2019-05-22 DIAGNOSIS — M545 Low back pain: Secondary | ICD-10-CM | POA: Diagnosis not present

## 2019-05-23 ENCOUNTER — Encounter: Payer: Medicare Other | Admitting: Obstetrics & Gynecology

## 2019-05-24 DIAGNOSIS — M9904 Segmental and somatic dysfunction of sacral region: Secondary | ICD-10-CM | POA: Diagnosis not present

## 2019-05-24 DIAGNOSIS — M545 Low back pain: Secondary | ICD-10-CM | POA: Diagnosis not present

## 2019-05-24 DIAGNOSIS — M62830 Muscle spasm of back: Secondary | ICD-10-CM | POA: Diagnosis not present

## 2019-05-24 DIAGNOSIS — M5418 Radiculopathy, sacral and sacrococcygeal region: Secondary | ICD-10-CM | POA: Diagnosis not present

## 2019-05-24 DIAGNOSIS — M9903 Segmental and somatic dysfunction of lumbar region: Secondary | ICD-10-CM | POA: Diagnosis not present

## 2019-05-25 DIAGNOSIS — M9904 Segmental and somatic dysfunction of sacral region: Secondary | ICD-10-CM | POA: Diagnosis not present

## 2019-05-25 DIAGNOSIS — M62830 Muscle spasm of back: Secondary | ICD-10-CM | POA: Diagnosis not present

## 2019-05-25 DIAGNOSIS — M9903 Segmental and somatic dysfunction of lumbar region: Secondary | ICD-10-CM | POA: Diagnosis not present

## 2019-05-25 DIAGNOSIS — M545 Low back pain: Secondary | ICD-10-CM | POA: Diagnosis not present

## 2019-05-25 DIAGNOSIS — M5418 Radiculopathy, sacral and sacrococcygeal region: Secondary | ICD-10-CM | POA: Diagnosis not present

## 2019-05-26 ENCOUNTER — Other Ambulatory Visit: Payer: Self-pay

## 2019-05-26 ENCOUNTER — Other Ambulatory Visit (INDEPENDENT_AMBULATORY_CARE_PROVIDER_SITE_OTHER): Payer: Medicare Other

## 2019-05-26 ENCOUNTER — Telehealth: Payer: Self-pay | Admitting: Family Medicine

## 2019-05-26 DIAGNOSIS — E875 Hyperkalemia: Secondary | ICD-10-CM

## 2019-05-26 LAB — COMPREHENSIVE METABOLIC PANEL
ALT: 31 U/L (ref 0–35)
AST: 27 U/L (ref 0–37)
Albumin: 4.7 g/dL (ref 3.5–5.2)
Alkaline Phosphatase: 43 U/L (ref 39–117)
BUN: 21 mg/dL (ref 6–23)
CO2: 28 mEq/L (ref 19–32)
Calcium: 9.8 mg/dL (ref 8.4–10.5)
Chloride: 99 mEq/L (ref 96–112)
Creatinine, Ser: 1.02 mg/dL (ref 0.40–1.20)
GFR: 53.84 mL/min — ABNORMAL LOW (ref >60.00)
Glucose, Bld: 115 mg/dL — ABNORMAL HIGH (ref 70–99)
Potassium: 4.7 mEq/L (ref 3.5–5.1)
Sodium: 136 mEq/L (ref 135–145)
Total Bilirubin: 0.6 mg/dL (ref 0.2–1.2)
Total Protein: 7.1 g/dL (ref 6.0–8.3)

## 2019-05-26 NOTE — Telephone Encounter (Signed)
-----   Message from Ellamae Sia sent at 05/23/2019 12:54 PM EST ----- Regarding: Lab orders for Friday, 1.15.21 Lab orders

## 2019-05-29 DIAGNOSIS — M9904 Segmental and somatic dysfunction of sacral region: Secondary | ICD-10-CM | POA: Diagnosis not present

## 2019-05-29 DIAGNOSIS — M545 Low back pain: Secondary | ICD-10-CM | POA: Diagnosis not present

## 2019-05-29 DIAGNOSIS — M62830 Muscle spasm of back: Secondary | ICD-10-CM | POA: Diagnosis not present

## 2019-05-29 DIAGNOSIS — M5418 Radiculopathy, sacral and sacrococcygeal region: Secondary | ICD-10-CM | POA: Diagnosis not present

## 2019-05-29 DIAGNOSIS — M9903 Segmental and somatic dysfunction of lumbar region: Secondary | ICD-10-CM | POA: Diagnosis not present

## 2019-05-31 DIAGNOSIS — M62830 Muscle spasm of back: Secondary | ICD-10-CM | POA: Diagnosis not present

## 2019-05-31 DIAGNOSIS — M545 Low back pain: Secondary | ICD-10-CM | POA: Diagnosis not present

## 2019-05-31 DIAGNOSIS — M9904 Segmental and somatic dysfunction of sacral region: Secondary | ICD-10-CM | POA: Diagnosis not present

## 2019-05-31 DIAGNOSIS — M5418 Radiculopathy, sacral and sacrococcygeal region: Secondary | ICD-10-CM | POA: Diagnosis not present

## 2019-05-31 DIAGNOSIS — M9903 Segmental and somatic dysfunction of lumbar region: Secondary | ICD-10-CM | POA: Diagnosis not present

## 2019-06-01 DIAGNOSIS — M545 Low back pain: Secondary | ICD-10-CM | POA: Diagnosis not present

## 2019-06-01 DIAGNOSIS — M62830 Muscle spasm of back: Secondary | ICD-10-CM | POA: Diagnosis not present

## 2019-06-01 DIAGNOSIS — M9903 Segmental and somatic dysfunction of lumbar region: Secondary | ICD-10-CM | POA: Diagnosis not present

## 2019-06-01 DIAGNOSIS — M9904 Segmental and somatic dysfunction of sacral region: Secondary | ICD-10-CM | POA: Diagnosis not present

## 2019-06-01 DIAGNOSIS — M5418 Radiculopathy, sacral and sacrococcygeal region: Secondary | ICD-10-CM | POA: Diagnosis not present

## 2019-06-05 DIAGNOSIS — M545 Low back pain: Secondary | ICD-10-CM | POA: Diagnosis not present

## 2019-06-05 DIAGNOSIS — M62830 Muscle spasm of back: Secondary | ICD-10-CM | POA: Diagnosis not present

## 2019-06-05 DIAGNOSIS — M9903 Segmental and somatic dysfunction of lumbar region: Secondary | ICD-10-CM | POA: Diagnosis not present

## 2019-06-05 DIAGNOSIS — M9904 Segmental and somatic dysfunction of sacral region: Secondary | ICD-10-CM | POA: Diagnosis not present

## 2019-06-05 DIAGNOSIS — M5418 Radiculopathy, sacral and sacrococcygeal region: Secondary | ICD-10-CM | POA: Diagnosis not present

## 2019-06-08 DIAGNOSIS — M9904 Segmental and somatic dysfunction of sacral region: Secondary | ICD-10-CM | POA: Diagnosis not present

## 2019-06-08 DIAGNOSIS — M5418 Radiculopathy, sacral and sacrococcygeal region: Secondary | ICD-10-CM | POA: Diagnosis not present

## 2019-06-08 DIAGNOSIS — M545 Low back pain: Secondary | ICD-10-CM | POA: Diagnosis not present

## 2019-06-08 DIAGNOSIS — M9903 Segmental and somatic dysfunction of lumbar region: Secondary | ICD-10-CM | POA: Diagnosis not present

## 2019-06-08 DIAGNOSIS — M62830 Muscle spasm of back: Secondary | ICD-10-CM | POA: Diagnosis not present

## 2019-06-12 DIAGNOSIS — M9904 Segmental and somatic dysfunction of sacral region: Secondary | ICD-10-CM | POA: Diagnosis not present

## 2019-06-12 DIAGNOSIS — M545 Low back pain: Secondary | ICD-10-CM | POA: Diagnosis not present

## 2019-06-12 DIAGNOSIS — M9903 Segmental and somatic dysfunction of lumbar region: Secondary | ICD-10-CM | POA: Diagnosis not present

## 2019-06-12 DIAGNOSIS — M5418 Radiculopathy, sacral and sacrococcygeal region: Secondary | ICD-10-CM | POA: Diagnosis not present

## 2019-06-12 DIAGNOSIS — M62830 Muscle spasm of back: Secondary | ICD-10-CM | POA: Diagnosis not present

## 2019-06-17 ENCOUNTER — Ambulatory Visit: Payer: Medicare Other | Attending: Internal Medicine

## 2019-06-17 DIAGNOSIS — Z23 Encounter for immunization: Secondary | ICD-10-CM | POA: Insufficient documentation

## 2019-06-17 NOTE — Progress Notes (Signed)
   Covid-19 Vaccination Clinic  Name:  Rachael Sharp    MRN: [id number] DOB: [birth year]  06/17/2019  [name] was observed post Covid-19 immunization for 15 minutes without incidence. She was provided with Vaccine Information Sheet and instruction to access the V-Safe system.   [name] was instructed to call 911 with any severe reactions post vaccine: Marland Kitchen Difficulty breathing  . Swelling of your face and throat  . A fast heartbeat  . A bad rash all over your body  . Dizziness and weakness    Immunizations Administered    Name Date Dose VIS Date Route   Pfizer COVID-19 Vaccine 06/17/2019  4:31 PM 0.3 mL 04/21/2019 Intramuscular   Manufacturer: [place]   Lot: [id number]   [place]: [id number]

## 2019-06-19 ENCOUNTER — Ambulatory Visit: Payer: Medicare Other

## 2019-07-12 ENCOUNTER — Ambulatory Visit: Payer: Medicare Other | Attending: Internal Medicine

## 2019-07-12 DIAGNOSIS — Z23 Encounter for immunization: Secondary | ICD-10-CM | POA: Insufficient documentation

## 2019-07-12 NOTE — Progress Notes (Signed)
   Covid-19 Vaccination Clinic  Name:  Rachael Sharp    MRN: [id number] DOB: 1950-07-07  07/12/2019  Rachael Sharp was observed post Covid-19 immunization for 15 minutes without incident. She was provided with Vaccine Information Sheet and instruction to access the V-Safe system.   Rachael Sharp was instructed to call 911 with any severe reactions post vaccine: Marland Kitchen Difficulty breathing  . Swelling of face and throat  . A fast heartbeat  . A bad rash all over body  . Dizziness and weakness   Immunizations Administered    Name Date Dose VIS Date Route   Pfizer COVID-19 Vaccine 07/12/2019  1:08 PM 0.3 mL 04/21/2019 Intramuscular   Manufacturer: [place]   Lot: [id number]   [place]: [id number]

## 2019-07-24 ENCOUNTER — Other Ambulatory Visit: Payer: Self-pay

## 2019-07-25 ENCOUNTER — Encounter: Payer: Self-pay | Admitting: Obstetrics & Gynecology

## 2019-07-25 ENCOUNTER — Ambulatory Visit (INDEPENDENT_AMBULATORY_CARE_PROVIDER_SITE_OTHER): Payer: Medicare Other | Admitting: Obstetrics & Gynecology

## 2019-07-25 VITALS — BP 132/86 | Ht 64.5 in | Wt 149.0 lb

## 2019-07-25 DIAGNOSIS — Z1501 Genetic susceptibility to malignant neoplasm of breast: Secondary | ICD-10-CM

## 2019-07-25 DIAGNOSIS — Z1502 Genetic susceptibility to malignant neoplasm of ovary: Secondary | ICD-10-CM

## 2019-07-25 DIAGNOSIS — Z01419 Encounter for gynecological examination (general) (routine) without abnormal findings: Secondary | ICD-10-CM

## 2019-07-25 DIAGNOSIS — Z9189 Other specified personal risk factors, not elsewhere classified: Secondary | ICD-10-CM | POA: Diagnosis not present

## 2019-07-25 DIAGNOSIS — Z1509 Genetic susceptibility to other malignant neoplasm: Secondary | ICD-10-CM

## 2019-07-25 DIAGNOSIS — N94819 Vulvodynia, unspecified: Secondary | ICD-10-CM

## 2019-07-25 DIAGNOSIS — Z90710 Acquired absence of both cervix and uterus: Secondary | ICD-10-CM

## 2019-07-25 DIAGNOSIS — Z780 Asymptomatic menopausal state: Secondary | ICD-10-CM

## 2019-07-25 DIAGNOSIS — Z1589 Genetic susceptibility to other disease: Secondary | ICD-10-CM

## 2019-08-03 ENCOUNTER — Encounter: Payer: Self-pay | Admitting: Obstetrics & Gynecology

## 2019-08-03 NOTE — Patient Instructions (Signed)
1. Well female exam with routine gynecological exam Gynecologic exam status post total hysterectomy and menopause.  Pap test Neg January 2019, no indication to repeat.  Breast exam normal.  Good body mass index at 25.18.  Continue with fitness and healthy nutrition.  Health labs with family physician.  Colonoscopy 2020.  2. S/P total hysterectomy  3. Postmenopausal Well on no hormone replacement therapy.  Bone density normal in 02/2018.  Vitamin D supplements, calcium intake of 1200 mg daily and regular weightbearing physical activities to continue.  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  5. Monoallelic mutation of CHEK2 gene in female patient Screening mammogram and MRI of the breast annually.  Caralyn, it was a pleasure seeing you today!

## 2019-08-03 NOTE — Progress Notes (Signed)
Rachael Sharp [facility] Jun 22, 1950 [id number]   History:    69 y.o. G0 Married.  Retired, enjoyed traveling, no traveling since [place].  RP:  Established patient presenting for annual gyn exam   HPI: Menopause well on no HRT.  S/P Total Hysterectomy.  Monoallelic mutation of CHEK2 gene.  Screening mammo/MRI yearly.  Breasts normal.  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  No IC.  Urine/BMs normal.  BMI 25.18.  Aerobic activities 5x per week and light weight lifting.  Health labs with Fam MD.    Past medical history,surgical history, family history and social history were all reviewed and documented in the EPIC chart.  Gynecologic History No LMP recorded. Patient has had a hysterectomy.  Obstetric History OB History  Gravida Para Term Preterm AB Living  0 0 0 0 0 0  SAB TAB Ectopic Multiple Live Births  0 0 0 0 0     ROS: A ROS was performed and pertinent positives and negatives are included in the history.  GENERAL: No fevers or chills. HEENT: No change in vision, no earache, sore throat or sinus congestion. NECK: No pain or stiffness. CARDIOVASCULAR: No chest pain or pressure. No palpitations. PULMONARY: No shortness of breath, cough or wheeze. GASTROINTESTINAL: No abdominal pain, nausea, vomiting or diarrhea, melena or bright red blood per rectum. GENITOURINARY: No urinary frequency, urgency, hesitancy or dysuria. MUSCULOSKELETAL: No joint or muscle pain, no back pain, no recent trauma. DERMATOLOGIC: No rash, no itching, no lesions. ENDOCRINE: No polyuria, polydipsia, no heat or cold intolerance. No recent change in weight. HEMATOLOGICAL: No anemia or easy bruising or bleeding. NEUROLOGIC: No headache, seizures, numbness, tingling or weakness. PSYCHIATRIC: No depression, no loss of interest in normal activity or change in sleep pattern.     Exam:   BP 132/86   Ht 5' 4.5" (1.638 m)   Wt 149 lb (67.6 kg)   BMI 25.18 kg/m   Body mass index is 25.18 kg/m.  General appearance :  Well developed well nourished female. No acute distress HEENT: Eyes: no retinal hemorrhage or exudates,  Neck supple, trachea midline, no carotid bruits, no thyroidmegaly Lungs: Clear to auscultation, no rhonchi or wheezes, or rib retractions  Heart: Regular rate and rhythm, no murmurs or gallops Breast:Examined in sitting and supine position were symmetrical in appearance, no palpable masses or tenderness,  no skin retraction, no nipple inversion, no nipple discharge, no skin discoloration, no axillary or supraclavicular lymphadenopathy Abdomen: no palpable masses or tenderness, no rebound or guarding Extremities: no edema or skin discoloration or tenderness  Pelvic: Vulva: Normal             Vagina: No gross lesions or discharge  Cervix/Uterus absent  Adnexa  Without masses or tenderness  Anus: Normal   Assessment/Plan:  69 y.o. female for annual exam   1. Well female exam with routine gynecological exam Gynecologic exam status post total hysterectomy and menopause.  Pap test Neg January 2019, no indication to repeat.  Breast exam normal.  Good body mass index at 25.18.  Continue with fitness and healthy nutrition.  Health labs with family physician.  Colonoscopy 2020.  2. S/P total hysterectomy  3. Postmenopausal Well on no hormone replacement therapy.  Bone density normal in 02/2018.  Vitamin D supplements, calcium intake of 1200 mg daily and regular weightbearing physical activities to continue.  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  5. Monoallelic mutation of CHEK2 gene in female patient Screening mammogram and MRI of the breast annually.  Princess Bruins MD, 9:25 PM 08/03/2019

## 2019-08-25 ENCOUNTER — Other Ambulatory Visit: Payer: Self-pay | Admitting: Family Medicine

## 2019-08-25 DIAGNOSIS — Z1231 Encounter for screening mammogram for malignant neoplasm of breast: Secondary | ICD-10-CM

## 2019-09-27 ENCOUNTER — Other Ambulatory Visit: Payer: Self-pay

## 2019-09-27 ENCOUNTER — Ambulatory Visit
Admission: RE | Admit: 2019-09-27 | Discharge: 2019-09-27 | Disposition: A | Discharge status: Home / Self Care | Payer: Medicare Other | Source: Ambulatory Visit | Attending: Family Medicine | Admitting: Family Medicine

## 2019-09-27 DIAGNOSIS — Z1231 Encounter for screening mammogram for malignant neoplasm of breast: Secondary | ICD-10-CM

## 2019-10-19 DIAGNOSIS — Z1509 Genetic susceptibility to other malignant neoplasm: Secondary | ICD-10-CM

## 2019-11-07 NOTE — Telephone Encounter (Signed)
Pt had visit on 05/19/19.

## 2019-11-24 DIAGNOSIS — D2262 Melanocytic nevi of left upper limb, including shoulder: Secondary | ICD-10-CM | POA: Diagnosis not present

## 2019-11-24 DIAGNOSIS — D2272 Melanocytic nevi of left lower limb, including hip: Secondary | ICD-10-CM | POA: Diagnosis not present

## 2019-11-24 DIAGNOSIS — L814 Other melanin hyperpigmentation: Secondary | ICD-10-CM | POA: Diagnosis not present

## 2019-11-24 DIAGNOSIS — D2261 Melanocytic nevi of right upper limb, including shoulder: Secondary | ICD-10-CM | POA: Diagnosis not present

## 2019-11-24 DIAGNOSIS — D225 Melanocytic nevi of trunk: Secondary | ICD-10-CM | POA: Diagnosis not present

## 2019-11-24 DIAGNOSIS — B353 Tinea pedis: Secondary | ICD-10-CM | POA: Diagnosis not present

## 2019-11-24 DIAGNOSIS — D2271 Melanocytic nevi of right lower limb, including hip: Secondary | ICD-10-CM | POA: Diagnosis not present

## 2020-01-19 ENCOUNTER — Telehealth: Payer: Self-pay | Admitting: Family Medicine

## 2020-01-19 ENCOUNTER — Other Ambulatory Visit: Payer: Self-pay

## 2020-01-19 ENCOUNTER — Other Ambulatory Visit (INDEPENDENT_AMBULATORY_CARE_PROVIDER_SITE_OTHER): Payer: Medicare Other

## 2020-01-19 DIAGNOSIS — Z13220 Encounter for screening for lipoid disorders: Secondary | ICD-10-CM | POA: Diagnosis not present

## 2020-01-19 DIAGNOSIS — R7303 Prediabetes: Secondary | ICD-10-CM | POA: Diagnosis not present

## 2020-01-19 DIAGNOSIS — E538 Deficiency of other specified B group vitamins: Secondary | ICD-10-CM | POA: Diagnosis not present

## 2020-01-19 LAB — COMPREHENSIVE METABOLIC PANEL
ALT: 26 U/L (ref 0–35)
AST: 24 U/L (ref 0–37)
Albumin: 4.6 g/dL (ref 3.5–5.2)
Alkaline Phosphatase: 58 U/L (ref 39–117)
BUN: 26 mg/dL — ABNORMAL HIGH (ref 6–23)
CO2: 31 mEq/L (ref 19–32)
Calcium: 9.8 mg/dL (ref 8.4–10.5)
Chloride: 101 mEq/L (ref 96–112)
Creatinine, Ser: 1.04 mg/dL (ref 0.40–1.20)
GFR: 52.54 mL/min — ABNORMAL LOW (ref >60.00)
Glucose, Bld: 108 mg/dL — ABNORMAL HIGH (ref 70–99)
Potassium: 4.7 mEq/L (ref 3.5–5.1)
Sodium: 138 mEq/L (ref 135–145)
Total Bilirubin: 0.4 mg/dL (ref 0.2–1.2)
Total Protein: 6.7 g/dL (ref 6.0–8.3)

## 2020-01-19 LAB — LIPID PANEL
Cholesterol: 209 mg/dL — ABNORMAL HIGH (ref 0–200)
HDL: 75.4 mg/dL (ref >39.00)
LDL Cholesterol: 114 mg/dL — ABNORMAL HIGH (ref 0–99)
NonHDL: 133.98
Total CHOL/HDL Ratio: 3
Triglycerides: 101 mg/dL (ref 0.0–149.0)
VLDL: 20.2 mg/dL (ref 0.0–40.0)

## 2020-01-19 LAB — HEMOGLOBIN A1C: Hgb A1c MFr Bld: 5.9 % (ref 4.6–6.5)

## 2020-01-19 LAB — VITAMIN B12: Vitamin B-12: 748 pg/mL (ref 211–911)

## 2020-01-19 NOTE — Telephone Encounter (Signed)
-----   Message from Ellamae Sia sent at 01/02/2020  2:11 PM EDT ----- Regarding: Lab orders for 9.10.21  AWV lab orders, please.

## 2020-01-19 NOTE — Progress Notes (Signed)
No critical labs need to be addressed urgently. We will discuss labs in detail at upcoming office visit.   

## 2020-01-22 ENCOUNTER — Ambulatory Visit: Payer: Medicare Other

## 2020-01-23 ENCOUNTER — Ambulatory Visit (INDEPENDENT_AMBULATORY_CARE_PROVIDER_SITE_OTHER): Payer: Medicare Other

## 2020-01-23 ENCOUNTER — Other Ambulatory Visit: Payer: Self-pay

## 2020-01-23 VITALS — Wt 144.0 lb

## 2020-01-23 DIAGNOSIS — Z0000 Encounter for general adult medical examination without abnormal findings: Secondary | ICD-10-CM | POA: Diagnosis not present

## 2020-01-23 NOTE — Patient Instructions (Signed)
Rachael Sharp , Thank you for taking time to come for your Medicare Wellness Visit. I appreciate your ongoing commitment to your health goals. Please review the following plan we discussed and let me know if I can assist you in the future.   Screening recommendations/referrals: Colonoscopy: Up to date, completed 11/03/2018, due 10/2021 Mammogram: Up to date, completed 09/27/2019, due 09/2020 Bone Density: Up to date, completed 03/10/2018, due 2-5 years  Recommended yearly ophthalmology/optometry visit for glaucoma screening and checkup Recommended yearly dental visit for hygiene and checkup  Vaccinations: Influenza vaccine: Up to date, completed 01/01/2020, due 12/2020 Pneumococcal vaccine: Completed series Tdap vaccine: Up to date, completed 11/09/2010, due 11/2020 Shingles vaccine: due, check with your insurance regarding coverage    Covid-19:Completed series  Advanced directives: Please bring a copy of your POA (Power of Attorney) and/or Living Will to your next appointment.   Conditions/risks identified: none  Next appointment: Follow up in one year for your annual wellness visit    Preventive Care 65 Years and Older, Female Preventive care refers to lifestyle choices and visits with your health care provider that can promote health and wellness. What does preventive care include?  A yearly physical exam. This is also called an annual well check.  Dental exams once or twice a year.  Routine eye exams. Ask your health care provider how often you should have your eyes checked.  Personal lifestyle choices, including:  Daily care of your teeth and gums.  Regular physical activity.  Eating a healthy diet.  Avoiding tobacco and drug use.  Limiting alcohol use.  Practicing safe sex.  Taking low-dose aspirin every day.  Taking vitamin and mineral supplements as recommended by your health care provider. What happens during an annual well check? The services and screenings done  by your health care provider during your annual well check will depend on your age, overall health, lifestyle risk factors, and family history of disease. Counseling  Your health care provider may ask you questions about your:  Alcohol use.  Tobacco use.  Drug use.  Emotional well-being.  Home and relationship well-being.  Sexual activity.  Eating habits.  History of falls.  Memory and ability to understand (cognition).  Work and work Statistician.  Reproductive health. Screening  You may have the following tests or measurements:  Height, weight, and BMI.  Blood pressure.  Lipid and cholesterol levels. These may be checked every 5 years, or more frequently if you are over 98 years old.  Skin check.  Lung cancer screening. You may have this screening every year starting at age 1 if you have a 30-pack-year history of smoking and currently smoke or have quit within the past 15 years.  Fecal occult blood test (FOBT) of the stool. You may have this test every year starting at age 36.  Flexible sigmoidoscopy or colonoscopy. You may have a sigmoidoscopy every 5 years or a colonoscopy every 10 years starting at age 28.  Hepatitis C blood test.  Hepatitis B blood test.  Sexually transmitted disease (STD) testing.  Diabetes screening. This is done by checking your blood sugar (glucose) after you have not eaten for a while (fasting). You may have this done every 1-3 years.  Bone density scan. This is done to screen for osteoporosis. You may have this done starting at age 20.  Mammogram. This may be done every 1-2 years. Talk to your health care provider about how often you should have regular mammograms. Talk with your health care provider  about your test results, treatment options, and if necessary, the need for more tests. Vaccines  Your health care provider may recommend certain vaccines, such as:  Influenza vaccine. This is recommended every year.  Tetanus,  diphtheria, and acellular pertussis (Tdap, Td) vaccine. You may need a Td booster every 10 years.  Zoster vaccine. You may need this after age 9.  Pneumococcal 13-valent conjugate (PCV13) vaccine. One dose is recommended after age 104.  Pneumococcal polysaccharide (PPSV23) vaccine. One dose is recommended after age 12. Talk to your health care provider about which screenings and vaccines you need and how often you need them. This information is not intended to replace advice given to you by your health care provider. Make sure you discuss any questions you have with your health care provider. Document Released: 05/24/2015 Document Revised: 01/15/2016 Document Reviewed: 02/26/2015 Elsevier Interactive Patient Education  2017 [place] Prevention in the Home Falls can cause injuries. They can happen to people of all ages. There are many things you can do to make your home safe and to help prevent falls. What can I do on the outside of my home?  Regularly fix the edges of walkways and driveways and fix any cracks.  Remove anything that might make you trip as you walk through a door, such as a raised step or threshold.  Trim any bushes or trees on the path to your home.  Use bright outdoor lighting.  Clear any walking paths of anything that might make someone trip, such as rocks or tools.  Regularly check to see if handrails are loose or broken. Make sure that both sides of any steps have handrails.  Any raised decks and porches should have guardrails on the edges.  Have any leaves, snow, or ice cleared regularly.  Use sand or salt on walking paths during winter.  Clean up any spills in your garage right away. This includes oil or grease spills. What can I do in the bathroom?  Use night lights.  Install grab bars by the toilet and in the tub and shower. Do not use towel bars as grab bars.  Use non-skid mats or decals in the tub or shower.  If you need to sit down in  the shower, use a plastic, non-slip stool.  Keep the floor dry. Clean up any water that spills on the floor as soon as it happens.  Remove soap buildup in the tub or shower regularly.  Attach bath mats securely with double-sided non-slip rug tape.  Do not have throw rugs and other things on the floor that can make you trip. What can I do in the bedroom?  Use night lights.  Make sure that you have a light by your bed that is easy to reach.  Do not use any sheets or blankets that are too big for your bed. They should not hang down onto the floor.  Have a firm chair that has side arms. You can use this for support while you get dressed.  Do not have throw rugs and other things on the floor that can make you trip. What can I do in the kitchen?  Clean up any spills right away.  Avoid walking on wet floors.  Keep items that you use a lot in easy-to-reach places.  If you need to reach something above you, use a strong step stool that has a grab bar.  Keep electrical cords out of the way.  Do not use floor polish or  wax that makes floors slippery. If you must use wax, use non-skid floor wax.  Do not have throw rugs and other things on the floor that can make you trip. What can I do with my stairs?  Do not leave any items on the stairs.  Make sure that there are handrails on both sides of the stairs and use them. Fix handrails that are broken or loose. Make sure that handrails are as long as the stairways.  Check any carpeting to make sure that it is firmly attached to the stairs. Fix any carpet that is loose or worn.  Avoid having throw rugs at the top or bottom of the stairs. If you do have throw rugs, attach them to the floor with carpet tape.  Make sure that you have a light switch at the top of the stairs and the bottom of the stairs. If you do not have them, ask someone to add them for you. What else can I do to help prevent falls?  Wear shoes that:  Do not have high  heels.  Have rubber bottoms.  Are comfortable and fit you well.  Are closed at the toe. Do not wear sandals.  If you use a stepladder:  Make sure that it is fully opened. Do not climb a closed stepladder.  Make sure that both sides of the stepladder are locked into place.  Ask someone to hold it for you, if possible.  Clearly mark and make sure that you can see:  Any grab bars or handrails.  First and last steps.  Where the edge of each step is.  Use tools that help you move around (mobility aids) if they are needed. These include:  Canes.  Walkers.  Scooters.  Crutches.  Turn on the lights when you go into a dark area. Replace any light bulbs as soon as they burn out.  Set up your furniture so you have a clear path. Avoid moving your furniture around.  If any of your floors are uneven, fix them.  If there are any pets around you, be aware of where they are.  Review your medicines with your doctor. Some medicines can make you feel dizzy. This can increase your chance of falling. Ask your doctor what other things that you can do to help prevent falls. This information is not intended to replace advice given to you by your health care provider. Make sure you discuss any questions you have with your health care provider. Document Released: 02/21/2009 Document Revised: 10/03/2015 Document Reviewed: 06/01/2014 Elsevier Interactive Patient Education  2017 [employer].

## 2020-01-23 NOTE — Telephone Encounter (Signed)
Can we add a cbc to her labs drown 9/10?

## 2020-01-23 NOTE — Progress Notes (Signed)
PCP notes:  Health Maintenance: No gaps noted   Abnormal Screenings: none   Patient concerns: Left thigh pain over 1 year  Wax in ear Discuss starting a collagen supplement   Nurse concerns: none   Next PCP appt.: 01/26/2020 @ 9:40 am

## 2020-01-23 NOTE — Progress Notes (Signed)
Subjective:   Rachael Sharp is a 69 y.o. female who presents for Medicare Annual (Subsequent) preventive examination.  Review of Systems: N/A     I connected with the patient today by telephone and verified that I am speaking with the correct person using two identifiers. Location patient: home Location nurse: work Persons participating in the telephone visit: patient, nurse.   I discussed the limitations, risks, security and privacy concerns of performing an evaluation and management service by telephone and the availability of in person appointments. I also discussed with the patient that there may be a patient responsible charge related to this service. The patient expressed understanding and verbally consented to this telephonic visit.        Cardiac Risk Factors include: advanced age (>28mn, >>17women)     Objective:    Today's Vitals   01/23/20 0948 01/23/20 0950  Weight: 144 lb (65.3 kg)   PainSc:  5    Body mass index is 24.34 kg/m.  Advanced Directives 01/23/2020 01/20/2019 01/12/2018 02/10/2017 01/28/2017 01/08/2017 10/17/2015  Does Patient Have a Medical Advance Directive? _0  Yes Yes  Type of AParamedicof ADuranLiving will HAnton RuizLiving will HGranvilleLiving will HCushmanLiving will HNewmanLiving will HSwanseaLiving will -  Copy of HAspen Hillin Chart? No - copy requested No - copy requested No - copy requested - - No - copy requested No - copy requested    Current Medications (verified) Outpatient Encounter Medications as of 01/23/2020  Medication Sig  . amitriptyline (ELAVIL) 10 MG tablet Take 3 tablets (30 mg total) by mouth at bedtime.  .Marland Kitchenaspirin 81 MG EC tablet Take 1 tablet by mouth daily.  .Marland KitchenBIOTIN PO Take 5,000 mcg by mouth daily.  . Glucosamine HCl-MSM (GLUCOSAMINE-MSM PO) Take 1 tablet by  mouth daily.  . Multiple Vitamin (MULTIVITAMIN) tablet Take 2 tablets by mouth daily.   . Omega-3 Fatty Acids (OMEGA 3 PO) Take 600 mg by mouth daily.   .Marland Kitchentretinoin (RETIN-A) 0.025 % cream Apply topically at bedtime.   No facility-administered encounter medications on file as of 01/23/2020.    Allergies (verified) Penicillins and Epinephrine   History: Past Medical History:  Diagnosis Date  . Arthritis    finger left index  . Colon stricture ([place]   . GERD (gastroesophageal reflux disease)   . Heart murmur    mitral valve prolapse  . Hyperlipidemia    denies 10/21/18,not on medication  . Hyperplastic colon polyp   . Monoallelic mutation of CHEK2 gene in female patient   . Vitamin D deficiency    Past Surgical History:  Procedure Laterality Date  . ABDOMINAL HYSTERECTOMY  August 2002  . BREAST BIOPSY     2002, benign right  . BREAST SURGERY  2002   biopsy x 2 , 1976  . COLONOSCOPY    . LIPOMA EXCISION  1983  . TONSILLECTOMY  1964  . TUBAL LIGATION  1982   Family History  Problem Relation Age of Onset  . COPD Sister   . Heart disease Sister 649      massive CVA,    . Stroke Sister   . Stroke Mother   . COPD Mother   . Heart disease Father   . Hyperlipidemia Father   . Breast cancer Paternal Aunt   . Breast cancer Paternal Aunt   . Breast  cancer Paternal Aunt   . Breast cancer Paternal Aunt   . Colon cancer Neg Hx   . Colon polyps Neg Hx   . Esophageal cancer Neg Hx   . Rectal cancer Neg Hx   . Stomach cancer Neg Hx    Social History   Socioeconomic History  . Marital status: Married    Spouse name: Not on file  . Number of children: Not on file  . Years of education: Not on file  . Highest education level: Not on file  Occupational History  . Occupation: retired  Tobacco Use  . Smoking status: Former Smoker    Types: Cigarettes    Quit date: 08/31/1989    Years since quitting: 30.4  . Smokeless tobacco: Never Used  . Tobacco comment: Quit 1991    Vaping Use  . Vaping Use: Never used  Substance and Sexual Activity  . Alcohol use: Yes    Alcohol/week: 7.0 standard drinks    Types: 7 Shots of liquor per week    Comment: 0-14 DRINKS A WEEK   . Drug use: No  . Sexual activity: Not Currently    Partners: Male    Comment: 1st intercourse-19, partners- 12, married- 24 yr s  Other Topics Concern  . Not on file  Social History Narrative  . Not on file   Social Determinants of Health   Financial Resource Strain: Low Risk   . Difficulty of Paying Living Expenses: Not hard at all  Food Insecurity: No Food Insecurity  . Worried About Charity fundraiser in the Last Year: Never true  . Ran Out of Food in the Last Year: Never true  Transportation Needs: No Transportation Needs  . Lack of Transportation (Medical): No  . Lack of Transportation (Non-Medical): No  Physical Activity: Sufficiently Active  . Days of Exercise per Week: 5 days  . Minutes of Exercise per Session: 90 min  Stress: Stress Concern Present  . Feeling of Stress : To some extent  Social Connections:   . Frequency of Communication with Friends and Family: Not on file  . Frequency of Social Gatherings with Friends and Family: Not on file  . Attends Religious Services: Not on file  . Active Member of Clubs or Organizations: Not on file  . Attends Archivist Meetings: Not on file  . Marital Status: Not on file    Tobacco Counseling Counseling given: Not Answered Comment: Quit 1991   Clinical Intake:  Pre-visit preparation completed: Yes  Pain : 0-10 Pain Score: 5  Pain Type: Chronic pain Pain Location: Leg Pain Orientation: Left Pain Descriptors / Indicators: Aching Pain Onset: More than a month ago Pain Frequency: Intermittent     Nutritional Risks: None Diabetes: No CBG done?: No Did pt. bring in CBG monitor from home?: No  How often do you need to have someone help you when you read instructions, pamphlets, or other written materials  from your doctor or pharmacy?: 1 - Never What is the last grade level you completed in school?: 2 years of college  Diabetic: no Nutrition Risk Assessment:  Has the patient had any N/V/D within the last 2 months?  No  Does the patient have any non-healing wounds?  No  Has the patient had any unintentional weight loss or weight gain?  No   Diabetes:  Is the patient diabetic?  No  If diabetic, was a CBG obtained today?  N/A Did the patient bring in their glucometer from home?  N/A How often do you monitor your CBG's? N/A.   Financial Strains and Diabetes Management:  Are you having any financial strains with the device, your supplies or your medication? N/A.  Does the patient want to be seen by Chronic Care Management for management of their diabetes?  N/A Would the patient like to be referred to a Nutritionist or for Diabetic Management?  N/A    Interpreter Needed?: No  Information entered by :: CJohnson, LPN   Activities of Daily Living In your present state of health, do you have any difficulty performing the following activities: 01/23/2020  Hearing? N  Vision? N  Difficulty concentrating or making decisions? N  Walking or climbing stairs? N  Dressing or bathing? N  Doing errands, shopping? N  Preparing Food and eating ? N  Using the Toilet? N  In the past six months, have you accidently leaked urine? N  Do you have problems with loss of bowel control? N  Managing your Medications? N  Managing your Finances? N  Housekeeping or managing your Housekeeping? N  Some recent data might be hidden    Patient Care Team: Jinny Sanders, MD as PCP - General (Family Medicine)  Indicate any recent Medical Services you may have received from other than [facility] providers in the past year (date may be approximate).     Assessment:   This is a routine wellness examination for Irja.  Hearing/Vision screen  Hearing Screening   _0  _1  _2  _3  _4  _5  _6   _7  _8   Right ear:           Left ear:           Vision Screening Comments: Patient gets annual eye exams  Dietary issues and exercise activities discussed: Current Exercise Habits: Home exercise routine, Type of exercise: strength training/weights;walking, Time (Minutes): 60, Frequency (Times/Week): 5, Weekly Exercise (Minutes/Week): 300, Intensity: Moderate, Exercise limited by: None identified  Goals    . Patient Stated     Starting 01/12/2018, I will continue to take medications as prescribed.     . Patient Stated     01/23/2020, I will continue to walk and weight lift 4-5 days a week for 45-90 minutes.    . Weight (lb) < 200 lb (90.7 kg)     01/20/2019, wants to lose 10 pounds      Depression Screen PHQ 2/9 Scores 01/23/2020 01/20/2019 01/12/2018 01/08/2017 11/19/2016 03/09/2012  PHQ - 2 Score 0 0 0 0 0 0  PHQ- 9 Score 0 1 0 - - -    Fall Risk Fall Risk  01/23/2020 01/20/2019 01/12/2018 01/08/2017 11/19/2016  Falls in the past year? 1 0 No No No  Comment fell off of ladder - - - -  Number falls in past yr: 0 - - - -  Injury with Fall? 0 - - - -  Risk for fall due to : No Fall Risks Impaired balance/gait - - -  Risk for fall due to: Comment - when in pain - - -  Follow up Falls evaluation completed;Falls prevention discussed Falls evaluation completed;Falls prevention discussed - - -    Any stairs in or around the home? Yes  If so, are there any without handrails? No  Home free of loose throw rugs in walkways, pet beds, electrical cords, etc? Yes  Adequate lighting in your home to reduce risk of falls? Yes   ASSISTIVE DEVICES UTILIZED TO PREVENT FALLS:  Life alert? No  Use of a cane,  walker or w/c? No  Grab bars in the bathroom? No  Shower chair or bench in shower? No  Elevated toilet seat or a handicapped toilet? No   TIMED UP AND GO:  Was the test performed? N/A, telephonic visit .   Cognitive Function: MMSE - Mini Mental State Exam 01/23/2020 01/20/2019 01/12/2018    Orientation to time _0 Orientation to Place _1 Registration _2 Attention/ Calculation 5 5 0  Recall _3 Language- name 2 objects - 0 0  Language- repeat _4 Language- follow 3 step command - 0 3  Language- read & follow direction - 0 0  Write a sentence - 0 0  Copy design - 0 0  Total score - 22 20  Mini Cog  Mini-Cog screen was completed. Maximum score is 22. A value of 0 denotes this part of the MMSE was not completed or the patient failed this part of the Mini-Cog screening.       Immunizations Immunization History  Administered Date(s) Administered  . Fluad Quad(high Dose 65+) 01/24/2019, 01/01/2020  . Influenza Split 03/08/2012, 02/11/2014, 02/09/2015  . Influenza Whole 04/05/2011  . Influenza, High Dose Seasonal PF 02/25/2017, 01/11/2018  . Influenza,inj,Quad PF,6+ Mos 03/13/2013  . Influenza-Unspecified 01/30/2016  . PFIZER SARS-COV-2 Vaccination 06/17/2019, 07/12/2019  . Pneumococcal Conjugate-13 01/08/2017  . Pneumococcal Polysaccharide-23 03/17/2018  . Tdap 11/09/2010  . Zoster 03/08/2012    TDAP status: Up to date Flu Vaccine status: Up to date Pneumococcal vaccine status: Up to date Covid-19 vaccine status: Completed vaccines  Qualifies for Shingles Vaccine? Yes   Zostavax completed Yes   Shingrix Completed?: No.    Education has been provided regarding the importance of this vaccine. Patient has been advised to call insurance company to determine out of pocket expense if they have not yet received this vaccine. Advised may also receive vaccine at local pharmacy or Health Dept. Verbalized acceptance and understanding.  Screening Tests Health Maintenance  Topic Date Due  . MAMMOGRAM  09/26/2020  . TETANUS/TDAP  11/08/2020  . COLONOSCOPY  11/02/2021  . INFLUENZA VACCINE  Completed  . DEXA SCAN  Completed  . COVID-19 Vaccine  Completed  . Hepatitis C Screening  Completed  . PNA vac Low Risk Adult  Completed    Health  Maintenance  There are no preventive care reminders to display for this patient.  Colorectal cancer screening: Completed 11/03/2018. Repeat every 3 years Mammogram status: Completed 09/27/2019. Repeat every year Bone Density status: Completed 03/10/2018. Results reflect: Bone density results: NORMAL. Repeat every 2-5 years.  Lung Cancer Screening: (Low Dose CT Chest recommended if Age 35-80 years, 30 pack-year currently smoking OR have quit w/in 15years.) does not qualify.    Additional Screening:  Hepatitis C Screening: does qualify; Completed 08/08/2015  Vision Screening: Recommended annual ophthalmology exams for early detection of glaucoma and other disorders of the eye. Is the patient up to date with their annual eye exam?  Yes  Who is the provider or what is the name of the office in which the patient attends annual eye exams? [facility] If pt is not established with a provider, would they like to be referred to a provider to establish care? No .   Dental Screening: Recommended annual dental exams for proper oral hygiene  Community Resource Referral / Chronic Care Management: CRR required this visit?  No   CCM required this visit?  No  Plan:     I have personally reviewed and noted the following in the patient's chart:   . Medical and social history . Use of alcohol, tobacco or illicit drugs  . Current medications and supplements . Functional ability and status . Nutritional status . Physical activity . Advanced directives . List of other physicians . Hospitalizations, surgeries, and ER visits in previous 12 months . Vitals . Screenings to include cognitive, depression, and falls . Referrals and appointments  In addition, I have reviewed and discussed with patient certain preventive protocols, quality metrics, and best practice recommendations. A written personalized care plan for preventive services as well as general preventive health recommendations  were provided to patient.   Due to this being a telephonic visit, the after visit summary with patients personalized plan was offered to patient via mail or my-chart.  Patient preferred to pick up at office at next visit.   Andrez Grime, LPN   [id number]

## 2020-01-26 ENCOUNTER — Other Ambulatory Visit: Payer: Self-pay

## 2020-01-26 ENCOUNTER — Ambulatory Visit (INDEPENDENT_AMBULATORY_CARE_PROVIDER_SITE_OTHER): Payer: Medicare Other | Admitting: Family Medicine

## 2020-01-26 VITALS — BP 118/60 | HR 69 | Temp 97.5°F | Ht 64.5 in | Wt 146.8 lb

## 2020-01-26 DIAGNOSIS — R7303 Prediabetes: Secondary | ICD-10-CM

## 2020-01-26 DIAGNOSIS — R030 Elevated blood-pressure reading, without diagnosis of hypertension: Secondary | ICD-10-CM | POA: Diagnosis not present

## 2020-01-26 DIAGNOSIS — N289 Disorder of kidney and ureter, unspecified: Secondary | ICD-10-CM

## 2020-01-26 NOTE — Assessment & Plan Note (Signed)
Stable control. 

## 2020-01-26 NOTE — Assessment & Plan Note (Signed)
GFR stable at 52. No protein in urine 05/2019.

## 2020-01-26 NOTE — Progress Notes (Signed)
Chief Complaint  Patient presents with  . Medicare Wellness    part 2   . Ear Fullness    R x 1 month ago     History of Present Illness: HPI  The patient presents for review of chronic health problems. He/She also has the following acute concerns today:  right ear fullness, no pain, ongoing x 1 moth, decreased hearing, likely wax. Treating with OTC cerumen removal drops.  The patient saw a LPN or RN for medicare wellness visit.  Prevention and wellness was reviewed in detail. Note reviewed and important notes copied below. Health Maintenance: No gaps noted Abnormal Screenings: none  01/26/20  Past elevated BP:    Improved control on no medicaiton. BP Readings from Last 3 Encounters:  01/26/20 118/60  07/25/19 132/86  05/19/19 140/80  Using medication without problems or lightheadedness: none  Chest pain with exertion: none Edema:none Short of breath:none Average home BPs: Other issues:   Clinical Support from 01/23/2020 in [place] [facility] Total Score 0      Exercise: back on aerobic exercise several times a week.  Diet: healthy eating habits Wt Readings from Last 3 Encounters:  01/26/20 146 lb 12 oz (66.6 kg)  01/23/20 144 lb (65.3 kg)  07/25/19 149 lb (67.6 kg)   The 10-year ASCVD risk score Mikey Bussing DC Jr., et al., 2013) is: 6.1%   Values used to calculate the score:     Age: 69 years     Sex: Female     Is Non-Hispanic African American: No     Diabetic: No     Tobacco smoker: No     Systolic Blood Pressure: 563 mmHg     Is BP treated: No     HDL Cholesterol: 75.4 mg/dL     Total Cholesterol: 209 mg/dL    prediabetes  Lab Results  Component Value Date   HGBA1C 5.9 01/19/2020    B12 def resolved.  This visit occurred during the SARS-CoV-2 public health emergency.  Safety protocols were in place, including screening questions prior to the visit, additional usage of staff PPE, and extensive cleaning of exam room while observing  appropriate contact time as indicated for disinfecting solutions.   COVID 19 screen:  No recent travel or known exposure to COVID19 The patient denies respiratory symptoms of COVID 19 at this time. The importance of social distancing was discussed today.     Review of Systems  Constitutional: Negative for chills and fever.  HENT: Negative for congestion and ear pain.   Eyes: Negative for pain and redness.  Respiratory: Negative for cough and shortness of breath.   Cardiovascular: Negative for chest pain, palpitations and leg swelling.  Gastrointestinal: Negative for abdominal pain, blood in stool, constipation, diarrhea, nausea and vomiting.  Genitourinary: Negative for dysuria.  Musculoskeletal: Negative for falls and myalgias.  Skin: Negative for rash.  Neurological: Negative for dizziness.  Psychiatric/Behavioral: Negative for depression. The patient is not nervous/anxious.       Past Medical History:  Diagnosis Date  . Arthritis    finger left index  . Colon stricture ([place])   . GERD (gastroesophageal reflux disease)   . Heart murmur    mitral valve prolapse  . Hyperlipidemia    denies 10/21/18,not on medication  . Hyperplastic colon polyp   . Monoallelic mutation of CHEK2 gene in female patient   . Vitamin D deficiency     reports that she quit smoking about 30 years ago.  Her smoking use included cigarettes. She has never used smokeless tobacco. She reports current alcohol use of about 7.0 standard drinks of alcohol per week. She reports that she does not use drugs.   Current Outpatient Medications:  .  amitriptyline (ELAVIL) 10 MG tablet, Take 3 tablets (30 mg total) by mouth at bedtime., Disp: 270 tablet, Rfl: 3 .  aspirin 81 MG EC tablet, Take 1 tablet by mouth daily., Disp: , Rfl:  .  BIOTIN PO, Take 5,000 mcg by mouth daily., Disp: , Rfl:  .  Glucosamine HCl-MSM (GLUCOSAMINE-MSM PO), Take 1 tablet by mouth daily., Disp: , Rfl:  .  Multiple Vitamin (MULTIVITAMIN)  tablet, Take 2 tablets by mouth daily. , Disp: , Rfl:  .  Omega-3 Fatty Acids (OMEGA 3 PO), Take 600 mg by mouth daily. , Disp: , Rfl:  .  Rhubarb (ESTROVEN COMPLETE PO), , Disp: , Rfl:  .  tretinoin (RETIN-A) 0.025 % cream, Apply topically at bedtime., Disp: , Rfl:    Observations/Objective: Blood pressure 118/60, pulse 69, temperature (!) 97.5 F (36.4 C), temperature source Temporal, height 5' 4.5" (1.638 m), weight 146 lb 12 oz (66.6 kg), SpO2 99 %.  Physical Exam Constitutional:      General: She is not in acute distress.    Appearance: Normal appearance. She is well-developed. She is not ill-appearing or toxic-appearing.  HENT:     Head: Normocephalic.     Right Ear: Hearing, tympanic membrane, ear canal and external ear normal.     Left Ear: Hearing, tympanic membrane, ear canal and external ear normal.     Nose: Nose normal.  Eyes:     General: Lids are normal. Lids are everted, no foreign bodies appreciated.     Conjunctiva/sclera: Conjunctivae normal.     Pupils: Pupils are equal, round, and reactive to light.  Neck:     Thyroid: No thyroid mass or thyromegaly.     Vascular: No carotid bruit.     Trachea: Trachea normal.  Cardiovascular:     Rate and Rhythm: Normal rate and regular rhythm.     Heart sounds: Normal heart sounds, S1 normal and S2 normal. No murmur heard.  No gallop.   Pulmonary:     Effort: Pulmonary effort is normal. No respiratory distress.     Breath sounds: Normal breath sounds. No wheezing, rhonchi or rales.  Abdominal:     General: Bowel sounds are normal. There is no distension or abdominal bruit.     Palpations: Abdomen is soft. There is no fluid wave or mass.     Tenderness: There is no abdominal tenderness. There is no guarding or rebound.     Hernia: No hernia is present.  Musculoskeletal:     Cervical back: Normal range of motion and neck supple.  Lymphadenopathy:     Cervical: No cervical adenopathy.  Skin:    General: Skin is warm and  dry.     Findings: No rash.  Neurological:     Mental Status: She is alert.     Cranial Nerves: No cranial nerve deficit.     Sensory: No sensory deficit.  Psychiatric:        Mood and Affect: Mood is not anxious or depressed.        Speech: Speech normal.        Behavior: Behavior normal. Behavior is cooperative.        Judgment: Judgment normal.    Breast exam: No mass, nodules, thickening, tenderness, bulging, retraction,  inflamation, nipple discharge or skin changes noted.  No axillary or clavicular LA.  Chaperoned exam.    Assessment and Plan   The patient's preventative maintenance and recommended screening tests for an annual wellness exam were reviewed in full today. Brought up to date unless services declined.  Counselled on the importance of diet, exercise, and its role in overall health and mortality. The patient's FH and SH was reviewed, including their home life, tobacco status, and drug and alcohol status.   Colonoscopy every 3 years. Dr Hilarie Fredrickson, last 10/2018 PAP/DVE not indicated,TAH.  Now seeing GYN  Dr. Dellis Filbert. Reviewed last OV 2021. Vaccines:uptodate, flu done, S/P COVID19  And PNA series. Plans to get shingles vaccine. Hep C: done Mammogram:Chek 2 mutation. Annualmam withbreast MRI (next MRI 03/2020 ). Needs breast exam twice a year. Mammogram 09/2019 Bone density:02/2018 normal, repeat in 5 years.  Prediabetes Stable control.  Elevated blood pressure reading in office without diagnosis of hypertension Resolved.  Renal insufficiency  GFR stable at 52. No protein in urine 05/2019.     Eliezer Lofts, MD

## 2020-01-26 NOTE — Assessment & Plan Note (Signed)
Resolved

## 2020-01-26 NOTE — Patient Instructions (Signed)
Preventive Care 69 Years and Older, Female Preventive care refers to lifestyle choices and visits with your health care provider that can promote health and wellness. This includes:  A yearly physical exam. This is also called an annual well check.  Regular dental and eye exams.  Immunizations.  Screening for certain conditions.  Healthy lifestyle choices, such as diet and exercise. What can I expect for my preventive care visit? Physical exam Your health care provider will check:  Height and weight. These may be used to calculate body mass index (BMI), which is a measurement that tells if you are at a healthy weight.  Heart rate and blood pressure.  Your skin for abnormal spots. Counseling Your health care provider may ask you questions about:  Alcohol, tobacco, and drug use.  Emotional well-being.  Home and relationship well-being.  Sexual activity.  Eating habits.  History of falls.  Memory and ability to understand (cognition).  Work and work Statistician.  Pregnancy and menstrual history. What immunizations do I need?  Influenza (flu) vaccine  This is recommended every year. Tetanus, diphtheria, and pertussis (Tdap) vaccine  You may need a Td booster every 10 years. Varicella (chickenpox) vaccine  You may need this vaccine if you have not already been vaccinated. Zoster (shingles) vaccine  You may need this after age 69. Pneumococcal conjugate (PCV13) vaccine  One dose is recommended after age 69. Pneumococcal polysaccharide (PPSV23) vaccine  One dose is recommended after age 69. Measles, mumps, and rubella (MMR) vaccine  You may need at least one dose of MMR if you were born in 1957 or later. You may also need a second dose. Meningococcal conjugate (MenACWY) vaccine  You may need this if you have certain conditions. Hepatitis A vaccine  You may need this if you have certain conditions or if you travel or work in places where you may be exposed  to hepatitis A. Hepatitis B vaccine  You may need this if you have certain conditions or if you travel or work in places where you may be exposed to hepatitis B. Haemophilus influenzae type b (Hib) vaccine  You may need this if you have certain conditions. You may receive vaccines as individual doses or as more than one vaccine together in one shot (combination vaccines). Talk with your health care provider about the risks and benefits of combination vaccines. What tests do I need? Blood tests  Lipid and cholesterol levels. These may be checked every 5 years, or more frequently depending on your overall health.  Hepatitis C test.  Hepatitis B test. Screening  Lung cancer screening. You may have this screening every year starting at age 69 if you have a 30-pack-year history of smoking and currently smoke or have quit within the past 15 years.  Colorectal cancer screening. All adults should have this screening starting at age 69 and continuing until age 4. Your health care provider may recommend screening at age 69 if you are at increased risk. You will have tests every 1-10 years, depending on your results and the type of screening test.  Diabetes screening. This is done by checking your blood sugar (glucose) after you have not eaten for a while (fasting). You may have this done every 1-3 years.  Mammogram. This may be done every 69 years. Talk with your health care provider about how often you should have regular mammograms.  BRCA-related cancer screening. This may be done if you have a family history of breast, ovarian, tubal, or peritoneal cancers.  Other tests °· Sexually transmitted disease (STD) testing. °· Bone density scan. This is done to screen for osteoporosis. You may have this done starting at age 69. °Follow these instructions at home: °Eating and drinking °· Eat a diet that includes fresh fruits and vegetables, whole grains, lean protein, and low-fat dairy products. Limit  your intake of foods with high amounts of sugar, saturated fats, and salt. °· Take vitamin and mineral supplements as recommended by your health care provider. °· Do not drink alcohol if your health care provider tells you not to drink. °· If you drink alcohol: °? Limit how much you have to 0-1 drink a day. °? Be aware of how much alcohol is in your drink. In the [place] one drink equals one 12 oz bottle of beer (355 mL), one 5 oz glass of wine (148 mL), or one 1½ oz glass of hard liquor (44 mL). °Lifestyle °· Take daily care of your teeth and gums. °· Stay active. Exercise for at least 30 minutes on 5 or more days each week. °· Do not use any products that contain nicotine or tobacco, such as cigarettes, e-cigarettes, and chewing tobacco. If you need help quitting, ask your health care provider. °· If you are sexually active, practice safe sex. Use a condom or other form of protection in order to prevent STIs (sexually transmitted infections). °· Talk with your health care provider about taking a low-dose aspirin or statin. °What's next? °· Go to your health care provider once a year for a well check visit. °· Ask your health care provider how often you should have your eyes and teeth checked. °· Stay up to date on all vaccines. °This information is not intended to replace advice given to you by your health care provider. Make sure you discuss any questions you have with your health care provider. °Document Revised: 04/21/2018 Document Reviewed: 04/21/2018 °Elsevier Patient Education © 2020 Elsevier Inc. ° °

## 2020-02-14 ENCOUNTER — Other Ambulatory Visit: Payer: Self-pay | Admitting: Obstetrics & Gynecology

## 2020-02-20 MED ORDER — AMITRIPTYLINE HCL 10 MG PO TABS: 30.0000 mg | ORAL_TABLET | Freq: Every day | ORAL | 1 refills | Status: DC

## 2020-03-09 ENCOUNTER — Ambulatory Visit: Payer: Medicare Other | Attending: Internal Medicine

## 2020-03-09 ENCOUNTER — Other Ambulatory Visit: Payer: Self-pay

## 2020-03-09 DIAGNOSIS — Z23 Encounter for immunization: Secondary | ICD-10-CM

## 2020-03-09 NOTE — Progress Notes (Signed)
° °  Covid-19 Vaccination Clinic  Name:  Rachael Sharp    MRN: [id number] DOB: 08/28/1950  03/09/2020  Rachael Sharp was observed post Covid-19 immunization for 15 minutes without incident. She was provided with Vaccine Information Sheet and instruction to access the V-Safe system.   Rachael Sharp was instructed to call 911 with any severe reactions post vaccine:  Difficulty breathing   Swelling of face and throat   A fast heartbeat   A bad rash all over body   Dizziness and weakness

## 2020-03-25 ENCOUNTER — Ambulatory Visit
Admission: RE | Admit: 2020-03-25 | Discharge: 2020-03-25 | Disposition: A | Discharge status: Home / Self Care | Payer: Medicare Other | Source: Ambulatory Visit | Attending: Obstetrics & Gynecology | Admitting: Obstetrics & Gynecology

## 2020-03-25 ENCOUNTER — Other Ambulatory Visit: Payer: Self-pay

## 2020-03-25 DIAGNOSIS — N6011 Diffuse cystic mastopathy of right breast: Secondary | ICD-10-CM | POA: Diagnosis not present

## 2020-03-25 DIAGNOSIS — Z1589 Genetic susceptibility to other disease: Secondary | ICD-10-CM

## 2020-03-25 DIAGNOSIS — Z1501 Genetic susceptibility to malignant neoplasm of breast: Secondary | ICD-10-CM

## 2020-03-25 MED ORDER — GADOBUTROL 1 MMOL/ML IV SOLN
6.0000 mL | Freq: Once | INTRAVENOUS | Status: AC | PRN
  Administered 2020-03-25: 6 mL via INTRAVENOUS

## 2020-07-15 DIAGNOSIS — M9902 Segmental and somatic dysfunction of thoracic region: Secondary | ICD-10-CM | POA: Diagnosis not present

## 2020-07-15 DIAGNOSIS — M9903 Segmental and somatic dysfunction of lumbar region: Secondary | ICD-10-CM | POA: Diagnosis not present

## 2020-07-15 DIAGNOSIS — M9901 Segmental and somatic dysfunction of cervical region: Secondary | ICD-10-CM | POA: Diagnosis not present

## 2020-07-15 DIAGNOSIS — M9906 Segmental and somatic dysfunction of lower extremity: Secondary | ICD-10-CM | POA: Diagnosis not present

## 2020-07-18 DIAGNOSIS — M9901 Segmental and somatic dysfunction of cervical region: Secondary | ICD-10-CM | POA: Diagnosis not present

## 2020-07-18 DIAGNOSIS — M9903 Segmental and somatic dysfunction of lumbar region: Secondary | ICD-10-CM | POA: Diagnosis not present

## 2020-07-18 DIAGNOSIS — M9906 Segmental and somatic dysfunction of lower extremity: Secondary | ICD-10-CM | POA: Diagnosis not present

## 2020-07-18 DIAGNOSIS — M9902 Segmental and somatic dysfunction of thoracic region: Secondary | ICD-10-CM | POA: Diagnosis not present

## 2020-07-22 DIAGNOSIS — M9902 Segmental and somatic dysfunction of thoracic region: Secondary | ICD-10-CM | POA: Diagnosis not present

## 2020-07-22 DIAGNOSIS — M9906 Segmental and somatic dysfunction of lower extremity: Secondary | ICD-10-CM | POA: Diagnosis not present

## 2020-07-22 DIAGNOSIS — M9903 Segmental and somatic dysfunction of lumbar region: Secondary | ICD-10-CM | POA: Diagnosis not present

## 2020-07-22 DIAGNOSIS — M9901 Segmental and somatic dysfunction of cervical region: Secondary | ICD-10-CM | POA: Diagnosis not present

## 2020-07-25 ENCOUNTER — Telehealth: Payer: Self-pay | Admitting: *Deleted

## 2020-07-25 ENCOUNTER — Ambulatory Visit (INDEPENDENT_AMBULATORY_CARE_PROVIDER_SITE_OTHER): Payer: Medicare Other | Admitting: Obstetrics & Gynecology

## 2020-07-25 ENCOUNTER — Encounter: Payer: Self-pay | Admitting: Obstetrics & Gynecology

## 2020-07-25 ENCOUNTER — Other Ambulatory Visit: Payer: Self-pay

## 2020-07-25 VITALS — BP 144/92 | Ht 64.25 in | Wt 154.0 lb

## 2020-07-25 DIAGNOSIS — Z01419 Encounter for gynecological examination (general) (routine) without abnormal findings: Secondary | ICD-10-CM

## 2020-07-25 DIAGNOSIS — Z1501 Genetic susceptibility to malignant neoplasm of breast: Secondary | ICD-10-CM

## 2020-07-25 DIAGNOSIS — Z90710 Acquired absence of both cervix and uterus: Secondary | ICD-10-CM

## 2020-07-25 DIAGNOSIS — N6310 Unspecified lump in the right breast, unspecified quadrant: Secondary | ICD-10-CM

## 2020-07-25 DIAGNOSIS — Z90722 Acquired absence of ovaries, bilateral: Secondary | ICD-10-CM | POA: Diagnosis not present

## 2020-07-25 DIAGNOSIS — Z1589 Genetic susceptibility to other disease: Secondary | ICD-10-CM

## 2020-07-25 DIAGNOSIS — Z780 Asymptomatic menopausal state: Secondary | ICD-10-CM

## 2020-07-25 DIAGNOSIS — Z9079 Acquired absence of other genital organ(s): Secondary | ICD-10-CM | POA: Diagnosis not present

## 2020-07-25 DIAGNOSIS — Z1509 Genetic susceptibility to other malignant neoplasm: Secondary | ICD-10-CM | POA: Diagnosis not present

## 2020-07-25 DIAGNOSIS — Z1502 Genetic susceptibility to malignant neoplasm of ovary: Secondary | ICD-10-CM | POA: Diagnosis not present

## 2020-07-25 NOTE — Progress Notes (Signed)
Rachael Sharp [facility] [birth year] [id number]   History:    70 y.o. G0 Married. Retired, enjoyed traveling, no traveling since [place].  RC:[id number] presenting for annual gyn exam   MGN:[id number] well on no HRT. S/P Total Hysterectomy/BSO. Monoallelic mutation of CHEK2 gene. Screening mammo/MRI yearly and Colono q3 yrs. Breasts normal. 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 No IC. Urine/BMs normal. BMI 26.23. Aerobic activities 5x per week and light weight lifting. Health labs with Fam MD.   Past medical history,surgical history, family history and social history were all reviewed and documented in the EPIC chart.  Gynecologic History No LMP recorded. Patient has had a hysterectomy.  Obstetric History OB History  Gravida Para Term Preterm AB Living  0 0 0 0 0 0  SAB IAB Ectopic Multiple Live Births  0 0 0 0 0     ROS: A ROS was performed and pertinent positives and negatives are included in the history.  GENERAL: No fevers or chills. HEENT: No change in vision, no earache, sore throat or sinus congestion. NECK: No pain or stiffness. CARDIOVASCULAR: No chest pain or pressure. No palpitations. PULMONARY: No shortness of breath, cough or wheeze. GASTROINTESTINAL: No abdominal pain, nausea, vomiting or diarrhea, melena or bright red blood per rectum. GENITOURINARY: No urinary frequency, urgency, hesitancy or dysuria. MUSCULOSKELETAL: No joint or muscle pain, no back pain, no recent trauma. DERMATOLOGIC: No rash, no itching, no lesions. ENDOCRINE: No polyuria, polydipsia, no heat or cold intolerance. No recent change in weight. HEMATOLOGICAL: No anemia or easy bruising or bleeding. NEUROLOGIC: No headache, seizures, numbness, tingling or weakness. PSYCHIATRIC: No depression, no loss of interest in normal activity or change in sleep pattern.     Exam:   BP (!) 144/92   Ht 5' 4.25" (1.632 m)   Wt 154 lb (69.9 kg)   BMI 26.23 kg/m   Body mass index is 26.23  kg/m.  General appearance : Well developed well nourished female. No acute distress HEENT: Eyes: no retinal hemorrhage or exudates,  Neck supple, trachea midline, no carotid bruits, no thyroidmegaly Lungs: Clear to auscultation, no rhonchi or wheezes, or rib retractions  Heart: Regular rate and rhythm, no murmurs or gallops Breast:Examined in sitting and supine position were symmetrical in appearance. Left breast:  No palpable masses or tenderness,  no skin retraction, no nipple inversion, no nipple discharge, no skin discoloration, no axillary or supraclavicular lymphadenopathy.  Right breast:  Rt breast mass 4 x 3 cm from 9 to 11 O'clock just distal to the Rt nipple. The mass is non-tender and mobile. No skin change. No right axillary LN felt.  Abdomen: no palpable masses or tenderness, no rebound or guarding Extremities: no edema or skin discoloration or tenderness  Pelvic: Vulva: Normal             Vagina: No gross lesions or discharge  Cervix: No gross lesions or discharge  Uterus  AV, normal size, shape and consistency, non-tender and mobile  Adnexa  Without masses or tenderness  Anus: Normal   Assessment/Plan:  70 y.o. female for annual exam   1. Well female exam with routine gynecological exam Gynecologic exam s/p Total Hysterectomy with BSO.  No indication for a Pap test this year.  Colono 2020.  BMI 26.23.  Health labs with Fam MD.  2. S/P total hysterectomy and BSO (bilateral salpingo-oophorectomy)  3. Postmenopausal Well on no HRT.    4. Breast mass, right Rt breast mass 4 x 3 cm from 9 to 11 O'clock  just distal to the Rt nipple. The mass is non-tender and mobile. No skin change. 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 Schedule Rt Dx mammo/US.  5. Monoallelic mutation of CHEK2 gene in female patient S/P BSO.  Breasts as above.  Princess Bruins MD, 12:03 PM 07/25/2020

## 2020-07-25 NOTE — Telephone Encounter (Signed)
-----  Message from Princess Bruins, MD sent at 07/25/2020 12:25 PM EDT ----- Regarding: Rt breast mass Rt breast mass 4 x 3 cm from 9 to 11 O'clock just distal to the Rt nipple.  The mass is non-tender and mobile.  No skin change.  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 Schedule [facility] D[facility].

## 2020-07-25 NOTE — Telephone Encounter (Signed)
Dr.Lavoie the patient could not get in to the breast center until May, she wanted to schedule at [facility] center in [place]. [place] said you must sign co-sign the orders before the schedule. Please advise

## 2020-07-25 NOTE — Telephone Encounter (Signed)
Patient scheduled at the breast center on 09/09/20 @ 8:20am

## 2020-07-26 DIAGNOSIS — M9901 Segmental and somatic dysfunction of cervical region: Secondary | ICD-10-CM | POA: Diagnosis not present

## 2020-07-26 DIAGNOSIS — M9906 Segmental and somatic dysfunction of lower extremity: Secondary | ICD-10-CM | POA: Diagnosis not present

## 2020-07-26 DIAGNOSIS — M9902 Segmental and somatic dysfunction of thoracic region: Secondary | ICD-10-CM | POA: Diagnosis not present

## 2020-07-26 DIAGNOSIS — M9903 Segmental and somatic dysfunction of lumbar region: Secondary | ICD-10-CM | POA: Diagnosis not present

## 2020-07-26 NOTE — Telephone Encounter (Signed)
I agree, please show me where to sign.

## 2020-07-29 DIAGNOSIS — M9903 Segmental and somatic dysfunction of lumbar region: Secondary | ICD-10-CM | POA: Diagnosis not present

## 2020-07-29 DIAGNOSIS — M9902 Segmental and somatic dysfunction of thoracic region: Secondary | ICD-10-CM | POA: Diagnosis not present

## 2020-07-29 DIAGNOSIS — M9906 Segmental and somatic dysfunction of lower extremity: Secondary | ICD-10-CM | POA: Diagnosis not present

## 2020-07-29 DIAGNOSIS — M9901 Segmental and somatic dysfunction of cervical region: Secondary | ICD-10-CM | POA: Diagnosis not present

## 2020-07-29 NOTE — Telephone Encounter (Signed)
I contacted [facility] they have order and it is signed. Notified patient. She will call to schedule appointment.

## 2020-07-30 ENCOUNTER — Other Ambulatory Visit: Payer: Self-pay | Admitting: *Deleted

## 2020-07-30 NOTE — Telephone Encounter (Signed)
Call placed to patient to answer questions regarding diagnostic breast imaging. Questions answered.  Patient has also spoken with [facility] regarding billing for imaging.   Patient is requesting update on RX for amitriptyline 10 mg tab, take 30mg  PO at HS.   Advised MyChart message received and request has been sent to provider for review. Advised patient Dr. Dellis Filbert is in the OR, response may not be immediate. Check with pharmacy in the next couple of days to confirm RX received. Patient verbalizes understanding.   Routing to Dr. Dellis Filbert.

## 2020-07-31 NOTE — Telephone Encounter (Signed)
Patient scheduled at 08/02/20 @ 1:40pm

## 2020-07-31 NOTE — Telephone Encounter (Signed)
Opened in error.  See telephone encounter dated 07/29/20.  Encounter closed.

## 2020-08-01 ENCOUNTER — Telehealth: Payer: Self-pay

## 2020-08-01 DIAGNOSIS — M9906 Segmental and somatic dysfunction of lower extremity: Secondary | ICD-10-CM | POA: Diagnosis not present

## 2020-08-01 DIAGNOSIS — M9903 Segmental and somatic dysfunction of lumbar region: Secondary | ICD-10-CM | POA: Diagnosis not present

## 2020-08-01 DIAGNOSIS — M9902 Segmental and somatic dysfunction of thoracic region: Secondary | ICD-10-CM | POA: Diagnosis not present

## 2020-08-01 DIAGNOSIS — M9901 Segmental and somatic dysfunction of cervical region: Secondary | ICD-10-CM | POA: Diagnosis not present

## 2020-08-01 NOTE — Telephone Encounter (Signed)
Patient called because she has been awaiting Refill on her Amitriptyline since Monday. Advised that it is with Dr. Dellis Filbert awaiting her signature.

## 2020-08-02 ENCOUNTER — Ambulatory Visit
Admission: RE | Admit: 2020-08-02 | Discharge: 2020-08-02 | Disposition: A | Discharge status: Home / Self Care | Payer: Medicare Other | Source: Ambulatory Visit | Attending: Obstetrics & Gynecology | Admitting: Obstetrics & Gynecology

## 2020-08-02 ENCOUNTER — Other Ambulatory Visit: Payer: Self-pay

## 2020-08-02 DIAGNOSIS — Z1502 Genetic susceptibility to malignant neoplasm of ovary: Secondary | ICD-10-CM | POA: Diagnosis not present

## 2020-08-02 DIAGNOSIS — N6310 Unspecified lump in the right breast, unspecified quadrant: Secondary | ICD-10-CM | POA: Diagnosis present

## 2020-08-02 DIAGNOSIS — Z1501 Genetic susceptibility to malignant neoplasm of breast: Secondary | ICD-10-CM

## 2020-08-02 DIAGNOSIS — R928 Other abnormal and inconclusive findings on diagnostic imaging of breast: Secondary | ICD-10-CM | POA: Diagnosis not present

## 2020-08-02 DIAGNOSIS — Z1589 Genetic susceptibility to other disease: Secondary | ICD-10-CM | POA: Insufficient documentation

## 2020-08-02 DIAGNOSIS — N6011 Diffuse cystic mastopathy of right breast: Secondary | ICD-10-CM | POA: Diagnosis not present

## 2020-08-02 DIAGNOSIS — Z1509 Genetic susceptibility to other malignant neoplasm: Secondary | ICD-10-CM | POA: Diagnosis not present

## 2020-08-02 DIAGNOSIS — N6453 Retraction of nipple: Secondary | ICD-10-CM | POA: Diagnosis not present

## 2020-08-02 MED ORDER — AMITRIPTYLINE HCL 10 MG PO TABS: 30.0000 mg | ORAL_TABLET | Freq: Every day | ORAL | 3 refills | Status: DC

## 2020-08-04 ENCOUNTER — Encounter: Payer: Self-pay | Admitting: Obstetrics & Gynecology

## 2020-08-05 DIAGNOSIS — M9903 Segmental and somatic dysfunction of lumbar region: Secondary | ICD-10-CM | POA: Diagnosis not present

## 2020-08-05 DIAGNOSIS — M9902 Segmental and somatic dysfunction of thoracic region: Secondary | ICD-10-CM | POA: Diagnosis not present

## 2020-08-05 DIAGNOSIS — M9901 Segmental and somatic dysfunction of cervical region: Secondary | ICD-10-CM | POA: Diagnosis not present

## 2020-08-05 DIAGNOSIS — M9906 Segmental and somatic dysfunction of lower extremity: Secondary | ICD-10-CM | POA: Diagnosis not present

## 2020-08-08 DIAGNOSIS — M9903 Segmental and somatic dysfunction of lumbar region: Secondary | ICD-10-CM | POA: Diagnosis not present

## 2020-08-08 DIAGNOSIS — M9902 Segmental and somatic dysfunction of thoracic region: Secondary | ICD-10-CM | POA: Diagnosis not present

## 2020-08-08 DIAGNOSIS — M9901 Segmental and somatic dysfunction of cervical region: Secondary | ICD-10-CM | POA: Diagnosis not present

## 2020-08-08 DIAGNOSIS — M9906 Segmental and somatic dysfunction of lower extremity: Secondary | ICD-10-CM | POA: Diagnosis not present

## 2020-08-12 DIAGNOSIS — M9906 Segmental and somatic dysfunction of lower extremity: Secondary | ICD-10-CM | POA: Diagnosis not present

## 2020-08-12 DIAGNOSIS — M9903 Segmental and somatic dysfunction of lumbar region: Secondary | ICD-10-CM | POA: Diagnosis not present

## 2020-08-12 DIAGNOSIS — M9902 Segmental and somatic dysfunction of thoracic region: Secondary | ICD-10-CM | POA: Diagnosis not present

## 2020-08-12 DIAGNOSIS — M9901 Segmental and somatic dysfunction of cervical region: Secondary | ICD-10-CM | POA: Diagnosis not present

## 2020-08-16 ENCOUNTER — Other Ambulatory Visit: Payer: Self-pay | Admitting: Obstetrics & Gynecology

## 2020-08-16 DIAGNOSIS — M9901 Segmental and somatic dysfunction of cervical region: Secondary | ICD-10-CM | POA: Diagnosis not present

## 2020-08-16 DIAGNOSIS — M9903 Segmental and somatic dysfunction of lumbar region: Secondary | ICD-10-CM | POA: Diagnosis not present

## 2020-08-16 DIAGNOSIS — M9902 Segmental and somatic dysfunction of thoracic region: Secondary | ICD-10-CM | POA: Diagnosis not present

## 2020-08-16 DIAGNOSIS — M9906 Segmental and somatic dysfunction of lower extremity: Secondary | ICD-10-CM | POA: Diagnosis not present

## 2020-08-16 NOTE — Telephone Encounter (Signed)
Rx request received from [facility] for amitriptyline 10mg  tab.  Call placed to pharmacy, spoke with Benjamine Mola. Rx sent on 08/02/20 for amitriptyline 10mg  tab, take 30mg  po at bedtime, #[id number]. Confirmed Rx received and patient has picked up Rx, this was a duplicate request, no additional Rx needed.   Encounter closed.

## 2020-08-19 DIAGNOSIS — M9906 Segmental and somatic dysfunction of lower extremity: Secondary | ICD-10-CM | POA: Diagnosis not present

## 2020-08-19 DIAGNOSIS — M9903 Segmental and somatic dysfunction of lumbar region: Secondary | ICD-10-CM | POA: Diagnosis not present

## 2020-08-19 DIAGNOSIS — M9902 Segmental and somatic dysfunction of thoracic region: Secondary | ICD-10-CM | POA: Diagnosis not present

## 2020-08-19 DIAGNOSIS — M9901 Segmental and somatic dysfunction of cervical region: Secondary | ICD-10-CM | POA: Diagnosis not present

## 2020-08-22 DIAGNOSIS — M9902 Segmental and somatic dysfunction of thoracic region: Secondary | ICD-10-CM | POA: Diagnosis not present

## 2020-08-22 DIAGNOSIS — M9906 Segmental and somatic dysfunction of lower extremity: Secondary | ICD-10-CM | POA: Diagnosis not present

## 2020-08-22 DIAGNOSIS — M9901 Segmental and somatic dysfunction of cervical region: Secondary | ICD-10-CM | POA: Diagnosis not present

## 2020-08-22 DIAGNOSIS — M9903 Segmental and somatic dysfunction of lumbar region: Secondary | ICD-10-CM | POA: Diagnosis not present

## 2020-08-26 DIAGNOSIS — M9902 Segmental and somatic dysfunction of thoracic region: Secondary | ICD-10-CM | POA: Diagnosis not present

## 2020-08-26 DIAGNOSIS — M9906 Segmental and somatic dysfunction of lower extremity: Secondary | ICD-10-CM | POA: Diagnosis not present

## 2020-08-26 DIAGNOSIS — M9903 Segmental and somatic dysfunction of lumbar region: Secondary | ICD-10-CM | POA: Diagnosis not present

## 2020-08-26 DIAGNOSIS — M9901 Segmental and somatic dysfunction of cervical region: Secondary | ICD-10-CM | POA: Diagnosis not present

## 2020-08-29 DIAGNOSIS — M9906 Segmental and somatic dysfunction of lower extremity: Secondary | ICD-10-CM | POA: Diagnosis not present

## 2020-08-29 DIAGNOSIS — M9903 Segmental and somatic dysfunction of lumbar region: Secondary | ICD-10-CM | POA: Diagnosis not present

## 2020-08-29 DIAGNOSIS — M9901 Segmental and somatic dysfunction of cervical region: Secondary | ICD-10-CM | POA: Diagnosis not present

## 2020-08-29 DIAGNOSIS — M9902 Segmental and somatic dysfunction of thoracic region: Secondary | ICD-10-CM | POA: Diagnosis not present

## 2020-09-02 DIAGNOSIS — M9903 Segmental and somatic dysfunction of lumbar region: Secondary | ICD-10-CM | POA: Diagnosis not present

## 2020-09-02 DIAGNOSIS — M9906 Segmental and somatic dysfunction of lower extremity: Secondary | ICD-10-CM | POA: Diagnosis not present

## 2020-09-02 DIAGNOSIS — M9901 Segmental and somatic dysfunction of cervical region: Secondary | ICD-10-CM | POA: Diagnosis not present

## 2020-09-02 DIAGNOSIS — M9902 Segmental and somatic dysfunction of thoracic region: Secondary | ICD-10-CM | POA: Diagnosis not present

## 2020-09-05 DIAGNOSIS — M9902 Segmental and somatic dysfunction of thoracic region: Secondary | ICD-10-CM | POA: Diagnosis not present

## 2020-09-05 DIAGNOSIS — M9903 Segmental and somatic dysfunction of lumbar region: Secondary | ICD-10-CM | POA: Diagnosis not present

## 2020-09-05 DIAGNOSIS — M9906 Segmental and somatic dysfunction of lower extremity: Secondary | ICD-10-CM | POA: Diagnosis not present

## 2020-09-05 DIAGNOSIS — M9901 Segmental and somatic dysfunction of cervical region: Secondary | ICD-10-CM | POA: Diagnosis not present

## 2020-09-09 ENCOUNTER — Other Ambulatory Visit: Payer: Medicare Other

## 2020-09-16 DIAGNOSIS — M9902 Segmental and somatic dysfunction of thoracic region: Secondary | ICD-10-CM | POA: Diagnosis not present

## 2020-09-16 DIAGNOSIS — M9906 Segmental and somatic dysfunction of lower extremity: Secondary | ICD-10-CM | POA: Diagnosis not present

## 2020-09-16 DIAGNOSIS — M9901 Segmental and somatic dysfunction of cervical region: Secondary | ICD-10-CM | POA: Diagnosis not present

## 2020-09-16 DIAGNOSIS — M9903 Segmental and somatic dysfunction of lumbar region: Secondary | ICD-10-CM | POA: Diagnosis not present

## 2020-09-18 DIAGNOSIS — M9902 Segmental and somatic dysfunction of thoracic region: Secondary | ICD-10-CM | POA: Diagnosis not present

## 2020-09-18 DIAGNOSIS — M9903 Segmental and somatic dysfunction of lumbar region: Secondary | ICD-10-CM | POA: Diagnosis not present

## 2020-09-18 DIAGNOSIS — M9901 Segmental and somatic dysfunction of cervical region: Secondary | ICD-10-CM | POA: Diagnosis not present

## 2020-09-18 DIAGNOSIS — M9906 Segmental and somatic dysfunction of lower extremity: Secondary | ICD-10-CM | POA: Diagnosis not present

## 2020-09-27 DIAGNOSIS — M9905 Segmental and somatic dysfunction of pelvic region: Secondary | ICD-10-CM | POA: Diagnosis not present

## 2020-09-27 DIAGNOSIS — M9903 Segmental and somatic dysfunction of lumbar region: Secondary | ICD-10-CM | POA: Diagnosis not present

## 2020-09-27 DIAGNOSIS — M5136 Other intervertebral disc degeneration, lumbar region: Secondary | ICD-10-CM | POA: Diagnosis not present

## 2020-09-27 DIAGNOSIS — M955 Acquired deformity of pelvis: Secondary | ICD-10-CM | POA: Diagnosis not present

## 2020-09-30 DIAGNOSIS — M5136 Other intervertebral disc degeneration, lumbar region: Secondary | ICD-10-CM | POA: Diagnosis not present

## 2020-09-30 DIAGNOSIS — M9905 Segmental and somatic dysfunction of pelvic region: Secondary | ICD-10-CM | POA: Diagnosis not present

## 2020-09-30 DIAGNOSIS — M955 Acquired deformity of pelvis: Secondary | ICD-10-CM | POA: Diagnosis not present

## 2020-09-30 DIAGNOSIS — M9903 Segmental and somatic dysfunction of lumbar region: Secondary | ICD-10-CM | POA: Diagnosis not present

## 2020-10-02 DIAGNOSIS — M9905 Segmental and somatic dysfunction of pelvic region: Secondary | ICD-10-CM | POA: Diagnosis not present

## 2020-10-02 DIAGNOSIS — M955 Acquired deformity of pelvis: Secondary | ICD-10-CM | POA: Diagnosis not present

## 2020-10-02 DIAGNOSIS — M5136 Other intervertebral disc degeneration, lumbar region: Secondary | ICD-10-CM | POA: Diagnosis not present

## 2020-10-02 DIAGNOSIS — M9903 Segmental and somatic dysfunction of lumbar region: Secondary | ICD-10-CM | POA: Diagnosis not present

## 2020-10-03 DIAGNOSIS — M9903 Segmental and somatic dysfunction of lumbar region: Secondary | ICD-10-CM | POA: Diagnosis not present

## 2020-10-03 DIAGNOSIS — M5136 Other intervertebral disc degeneration, lumbar region: Secondary | ICD-10-CM | POA: Diagnosis not present

## 2020-10-03 DIAGNOSIS — M955 Acquired deformity of pelvis: Secondary | ICD-10-CM | POA: Diagnosis not present

## 2020-10-03 DIAGNOSIS — M9905 Segmental and somatic dysfunction of pelvic region: Secondary | ICD-10-CM | POA: Diagnosis not present

## 2020-10-14 DIAGNOSIS — S70361A Insect bite (nonvenomous), right thigh, initial encounter: Secondary | ICD-10-CM | POA: Diagnosis not present

## 2020-10-14 DIAGNOSIS — S20369A Insect bite (nonvenomous) of unspecified front wall of thorax, initial encounter: Secondary | ICD-10-CM | POA: Diagnosis not present

## 2020-10-14 DIAGNOSIS — M5136 Other intervertebral disc degeneration, lumbar region: Secondary | ICD-10-CM | POA: Diagnosis not present

## 2020-10-14 DIAGNOSIS — S70362A Insect bite (nonvenomous), left thigh, initial encounter: Secondary | ICD-10-CM | POA: Diagnosis not present

## 2020-10-14 DIAGNOSIS — S40861A Insect bite (nonvenomous) of right upper arm, initial encounter: Secondary | ICD-10-CM | POA: Diagnosis not present

## 2020-10-14 DIAGNOSIS — M9905 Segmental and somatic dysfunction of pelvic region: Secondary | ICD-10-CM | POA: Diagnosis not present

## 2020-10-14 DIAGNOSIS — M955 Acquired deformity of pelvis: Secondary | ICD-10-CM | POA: Diagnosis not present

## 2020-10-14 DIAGNOSIS — S40862A Insect bite (nonvenomous) of left upper arm, initial encounter: Secondary | ICD-10-CM | POA: Diagnosis not present

## 2020-10-14 DIAGNOSIS — S20469A Insect bite (nonvenomous) of unspecified back wall of thorax, initial encounter: Secondary | ICD-10-CM | POA: Diagnosis not present

## 2020-10-14 DIAGNOSIS — M9903 Segmental and somatic dysfunction of lumbar region: Secondary | ICD-10-CM | POA: Diagnosis not present

## 2020-10-16 DIAGNOSIS — M9903 Segmental and somatic dysfunction of lumbar region: Secondary | ICD-10-CM | POA: Diagnosis not present

## 2020-10-16 DIAGNOSIS — M5136 Other intervertebral disc degeneration, lumbar region: Secondary | ICD-10-CM | POA: Diagnosis not present

## 2020-10-16 DIAGNOSIS — M955 Acquired deformity of pelvis: Secondary | ICD-10-CM | POA: Diagnosis not present

## 2020-10-16 DIAGNOSIS — M9905 Segmental and somatic dysfunction of pelvic region: Secondary | ICD-10-CM | POA: Diagnosis not present

## 2020-10-18 DIAGNOSIS — M9903 Segmental and somatic dysfunction of lumbar region: Secondary | ICD-10-CM | POA: Diagnosis not present

## 2020-10-18 DIAGNOSIS — M5136 Other intervertebral disc degeneration, lumbar region: Secondary | ICD-10-CM | POA: Diagnosis not present

## 2020-10-18 DIAGNOSIS — M955 Acquired deformity of pelvis: Secondary | ICD-10-CM | POA: Diagnosis not present

## 2020-10-18 DIAGNOSIS — M9905 Segmental and somatic dysfunction of pelvic region: Secondary | ICD-10-CM | POA: Diagnosis not present

## 2020-10-21 DIAGNOSIS — M5136 Other intervertebral disc degeneration, lumbar region: Secondary | ICD-10-CM | POA: Diagnosis not present

## 2020-10-21 DIAGNOSIS — M955 Acquired deformity of pelvis: Secondary | ICD-10-CM | POA: Diagnosis not present

## 2020-10-21 DIAGNOSIS — M9903 Segmental and somatic dysfunction of lumbar region: Secondary | ICD-10-CM | POA: Diagnosis not present

## 2020-10-21 DIAGNOSIS — M9905 Segmental and somatic dysfunction of pelvic region: Secondary | ICD-10-CM | POA: Diagnosis not present

## 2020-10-23 DIAGNOSIS — M9905 Segmental and somatic dysfunction of pelvic region: Secondary | ICD-10-CM | POA: Diagnosis not present

## 2020-10-23 DIAGNOSIS — M955 Acquired deformity of pelvis: Secondary | ICD-10-CM | POA: Diagnosis not present

## 2020-10-23 DIAGNOSIS — M9903 Segmental and somatic dysfunction of lumbar region: Secondary | ICD-10-CM | POA: Diagnosis not present

## 2020-10-23 DIAGNOSIS — M5136 Other intervertebral disc degeneration, lumbar region: Secondary | ICD-10-CM | POA: Diagnosis not present

## 2020-10-25 DIAGNOSIS — M955 Acquired deformity of pelvis: Secondary | ICD-10-CM | POA: Diagnosis not present

## 2020-10-25 DIAGNOSIS — M9903 Segmental and somatic dysfunction of lumbar region: Secondary | ICD-10-CM | POA: Diagnosis not present

## 2020-10-25 DIAGNOSIS — M9905 Segmental and somatic dysfunction of pelvic region: Secondary | ICD-10-CM | POA: Diagnosis not present

## 2020-10-25 DIAGNOSIS — M5136 Other intervertebral disc degeneration, lumbar region: Secondary | ICD-10-CM | POA: Diagnosis not present

## 2020-10-28 DIAGNOSIS — M9903 Segmental and somatic dysfunction of lumbar region: Secondary | ICD-10-CM | POA: Diagnosis not present

## 2020-10-28 DIAGNOSIS — M5136 Other intervertebral disc degeneration, lumbar region: Secondary | ICD-10-CM | POA: Diagnosis not present

## 2020-10-28 DIAGNOSIS — M955 Acquired deformity of pelvis: Secondary | ICD-10-CM | POA: Diagnosis not present

## 2020-10-28 DIAGNOSIS — M9905 Segmental and somatic dysfunction of pelvic region: Secondary | ICD-10-CM | POA: Diagnosis not present

## 2020-10-30 DIAGNOSIS — M9903 Segmental and somatic dysfunction of lumbar region: Secondary | ICD-10-CM | POA: Diagnosis not present

## 2020-10-30 DIAGNOSIS — M5136 Other intervertebral disc degeneration, lumbar region: Secondary | ICD-10-CM | POA: Diagnosis not present

## 2020-10-30 DIAGNOSIS — M9905 Segmental and somatic dysfunction of pelvic region: Secondary | ICD-10-CM | POA: Diagnosis not present

## 2020-10-30 DIAGNOSIS — M955 Acquired deformity of pelvis: Secondary | ICD-10-CM | POA: Diagnosis not present

## 2020-11-01 DIAGNOSIS — M5136 Other intervertebral disc degeneration, lumbar region: Secondary | ICD-10-CM | POA: Diagnosis not present

## 2020-11-01 DIAGNOSIS — M9905 Segmental and somatic dysfunction of pelvic region: Secondary | ICD-10-CM | POA: Diagnosis not present

## 2020-11-01 DIAGNOSIS — M955 Acquired deformity of pelvis: Secondary | ICD-10-CM | POA: Diagnosis not present

## 2020-11-01 DIAGNOSIS — M9903 Segmental and somatic dysfunction of lumbar region: Secondary | ICD-10-CM | POA: Diagnosis not present

## 2020-11-05 DIAGNOSIS — M9903 Segmental and somatic dysfunction of lumbar region: Secondary | ICD-10-CM | POA: Diagnosis not present

## 2020-11-05 DIAGNOSIS — M955 Acquired deformity of pelvis: Secondary | ICD-10-CM | POA: Diagnosis not present

## 2020-11-05 DIAGNOSIS — M5136 Other intervertebral disc degeneration, lumbar region: Secondary | ICD-10-CM | POA: Diagnosis not present

## 2020-11-05 DIAGNOSIS — M9905 Segmental and somatic dysfunction of pelvic region: Secondary | ICD-10-CM | POA: Diagnosis not present

## 2020-11-07 DIAGNOSIS — M9905 Segmental and somatic dysfunction of pelvic region: Secondary | ICD-10-CM | POA: Diagnosis not present

## 2020-11-07 DIAGNOSIS — M5136 Other intervertebral disc degeneration, lumbar region: Secondary | ICD-10-CM | POA: Diagnosis not present

## 2020-11-07 DIAGNOSIS — M9903 Segmental and somatic dysfunction of lumbar region: Secondary | ICD-10-CM | POA: Diagnosis not present

## 2020-11-07 DIAGNOSIS — M955 Acquired deformity of pelvis: Secondary | ICD-10-CM | POA: Diagnosis not present

## 2020-11-12 DIAGNOSIS — M5136 Other intervertebral disc degeneration, lumbar region: Secondary | ICD-10-CM | POA: Diagnosis not present

## 2020-11-12 DIAGNOSIS — M9905 Segmental and somatic dysfunction of pelvic region: Secondary | ICD-10-CM | POA: Diagnosis not present

## 2020-11-12 DIAGNOSIS — M955 Acquired deformity of pelvis: Secondary | ICD-10-CM | POA: Diagnosis not present

## 2020-11-12 DIAGNOSIS — M9903 Segmental and somatic dysfunction of lumbar region: Secondary | ICD-10-CM | POA: Diagnosis not present

## 2020-11-14 DIAGNOSIS — M9905 Segmental and somatic dysfunction of pelvic region: Secondary | ICD-10-CM | POA: Diagnosis not present

## 2020-11-14 DIAGNOSIS — M5136 Other intervertebral disc degeneration, lumbar region: Secondary | ICD-10-CM | POA: Diagnosis not present

## 2020-11-14 DIAGNOSIS — M9903 Segmental and somatic dysfunction of lumbar region: Secondary | ICD-10-CM | POA: Diagnosis not present

## 2020-11-14 DIAGNOSIS — M955 Acquired deformity of pelvis: Secondary | ICD-10-CM | POA: Diagnosis not present

## 2020-11-18 DIAGNOSIS — M9905 Segmental and somatic dysfunction of pelvic region: Secondary | ICD-10-CM | POA: Diagnosis not present

## 2020-11-18 DIAGNOSIS — M955 Acquired deformity of pelvis: Secondary | ICD-10-CM | POA: Diagnosis not present

## 2020-11-18 DIAGNOSIS — M9903 Segmental and somatic dysfunction of lumbar region: Secondary | ICD-10-CM | POA: Diagnosis not present

## 2020-11-18 DIAGNOSIS — M5136 Other intervertebral disc degeneration, lumbar region: Secondary | ICD-10-CM | POA: Diagnosis not present

## 2020-11-20 DIAGNOSIS — M9905 Segmental and somatic dysfunction of pelvic region: Secondary | ICD-10-CM | POA: Diagnosis not present

## 2020-11-20 DIAGNOSIS — M5136 Other intervertebral disc degeneration, lumbar region: Secondary | ICD-10-CM | POA: Diagnosis not present

## 2020-11-20 DIAGNOSIS — M955 Acquired deformity of pelvis: Secondary | ICD-10-CM | POA: Diagnosis not present

## 2020-11-20 DIAGNOSIS — M9903 Segmental and somatic dysfunction of lumbar region: Secondary | ICD-10-CM | POA: Diagnosis not present

## 2020-11-21 DIAGNOSIS — M9903 Segmental and somatic dysfunction of lumbar region: Secondary | ICD-10-CM | POA: Diagnosis not present

## 2020-11-21 DIAGNOSIS — M5136 Other intervertebral disc degeneration, lumbar region: Secondary | ICD-10-CM | POA: Diagnosis not present

## 2020-11-21 DIAGNOSIS — M955 Acquired deformity of pelvis: Secondary | ICD-10-CM | POA: Diagnosis not present

## 2020-11-21 DIAGNOSIS — H18513 Endothelial corneal dystrophy, bilateral: Secondary | ICD-10-CM | POA: Diagnosis not present

## 2020-11-21 DIAGNOSIS — M9905 Segmental and somatic dysfunction of pelvic region: Secondary | ICD-10-CM | POA: Diagnosis not present

## 2020-11-26 DIAGNOSIS — M955 Acquired deformity of pelvis: Secondary | ICD-10-CM | POA: Diagnosis not present

## 2020-11-26 DIAGNOSIS — M5136 Other intervertebral disc degeneration, lumbar region: Secondary | ICD-10-CM | POA: Diagnosis not present

## 2020-11-26 DIAGNOSIS — M9905 Segmental and somatic dysfunction of pelvic region: Secondary | ICD-10-CM | POA: Diagnosis not present

## 2020-11-26 DIAGNOSIS — M9903 Segmental and somatic dysfunction of lumbar region: Secondary | ICD-10-CM | POA: Diagnosis not present

## 2020-11-27 DIAGNOSIS — D2371 Other benign neoplasm of skin of right lower limb, including hip: Secondary | ICD-10-CM | POA: Diagnosis not present

## 2020-11-27 DIAGNOSIS — D2372 Other benign neoplasm of skin of left lower limb, including hip: Secondary | ICD-10-CM | POA: Diagnosis not present

## 2020-11-28 DIAGNOSIS — M5136 Other intervertebral disc degeneration, lumbar region: Secondary | ICD-10-CM | POA: Diagnosis not present

## 2020-11-28 DIAGNOSIS — M955 Acquired deformity of pelvis: Secondary | ICD-10-CM | POA: Diagnosis not present

## 2020-11-28 DIAGNOSIS — M9905 Segmental and somatic dysfunction of pelvic region: Secondary | ICD-10-CM | POA: Diagnosis not present

## 2020-11-28 DIAGNOSIS — M9903 Segmental and somatic dysfunction of lumbar region: Secondary | ICD-10-CM | POA: Diagnosis not present

## 2020-12-02 DIAGNOSIS — M5136 Other intervertebral disc degeneration, lumbar region: Secondary | ICD-10-CM | POA: Diagnosis not present

## 2020-12-02 DIAGNOSIS — M9903 Segmental and somatic dysfunction of lumbar region: Secondary | ICD-10-CM | POA: Diagnosis not present

## 2020-12-02 DIAGNOSIS — M955 Acquired deformity of pelvis: Secondary | ICD-10-CM | POA: Diagnosis not present

## 2020-12-02 DIAGNOSIS — M9905 Segmental and somatic dysfunction of pelvic region: Secondary | ICD-10-CM | POA: Diagnosis not present

## 2020-12-05 DIAGNOSIS — M9903 Segmental and somatic dysfunction of lumbar region: Secondary | ICD-10-CM | POA: Diagnosis not present

## 2020-12-05 DIAGNOSIS — M9905 Segmental and somatic dysfunction of pelvic region: Secondary | ICD-10-CM | POA: Diagnosis not present

## 2020-12-05 DIAGNOSIS — M955 Acquired deformity of pelvis: Secondary | ICD-10-CM | POA: Diagnosis not present

## 2020-12-05 DIAGNOSIS — M5136 Other intervertebral disc degeneration, lumbar region: Secondary | ICD-10-CM | POA: Diagnosis not present

## 2020-12-10 DIAGNOSIS — M9905 Segmental and somatic dysfunction of pelvic region: Secondary | ICD-10-CM | POA: Diagnosis not present

## 2020-12-10 DIAGNOSIS — M5136 Other intervertebral disc degeneration, lumbar region: Secondary | ICD-10-CM | POA: Diagnosis not present

## 2020-12-10 DIAGNOSIS — M9903 Segmental and somatic dysfunction of lumbar region: Secondary | ICD-10-CM | POA: Diagnosis not present

## 2020-12-10 DIAGNOSIS — M955 Acquired deformity of pelvis: Secondary | ICD-10-CM | POA: Diagnosis not present

## 2020-12-17 DIAGNOSIS — M9903 Segmental and somatic dysfunction of lumbar region: Secondary | ICD-10-CM | POA: Diagnosis not present

## 2020-12-17 DIAGNOSIS — M5136 Other intervertebral disc degeneration, lumbar region: Secondary | ICD-10-CM | POA: Diagnosis not present

## 2020-12-17 DIAGNOSIS — M9905 Segmental and somatic dysfunction of pelvic region: Secondary | ICD-10-CM | POA: Diagnosis not present

## 2020-12-17 DIAGNOSIS — M955 Acquired deformity of pelvis: Secondary | ICD-10-CM | POA: Diagnosis not present

## 2020-12-24 DIAGNOSIS — M9903 Segmental and somatic dysfunction of lumbar region: Secondary | ICD-10-CM | POA: Diagnosis not present

## 2020-12-24 DIAGNOSIS — M5136 Other intervertebral disc degeneration, lumbar region: Secondary | ICD-10-CM | POA: Diagnosis not present

## 2020-12-24 DIAGNOSIS — M955 Acquired deformity of pelvis: Secondary | ICD-10-CM | POA: Diagnosis not present

## 2020-12-24 DIAGNOSIS — M9905 Segmental and somatic dysfunction of pelvic region: Secondary | ICD-10-CM | POA: Diagnosis not present

## 2020-12-31 DIAGNOSIS — M5136 Other intervertebral disc degeneration, lumbar region: Secondary | ICD-10-CM | POA: Diagnosis not present

## 2020-12-31 DIAGNOSIS — M955 Acquired deformity of pelvis: Secondary | ICD-10-CM | POA: Diagnosis not present

## 2020-12-31 DIAGNOSIS — M9903 Segmental and somatic dysfunction of lumbar region: Secondary | ICD-10-CM | POA: Diagnosis not present

## 2020-12-31 DIAGNOSIS — M9905 Segmental and somatic dysfunction of pelvic region: Secondary | ICD-10-CM | POA: Diagnosis not present

## 2021-01-08 DIAGNOSIS — M9903 Segmental and somatic dysfunction of lumbar region: Secondary | ICD-10-CM | POA: Diagnosis not present

## 2021-01-08 DIAGNOSIS — M955 Acquired deformity of pelvis: Secondary | ICD-10-CM | POA: Diagnosis not present

## 2021-01-08 DIAGNOSIS — M5136 Other intervertebral disc degeneration, lumbar region: Secondary | ICD-10-CM | POA: Diagnosis not present

## 2021-01-08 DIAGNOSIS — M9905 Segmental and somatic dysfunction of pelvic region: Secondary | ICD-10-CM | POA: Diagnosis not present

## 2021-01-09 DIAGNOSIS — Z1509 Genetic susceptibility to other malignant neoplasm: Secondary | ICD-10-CM

## 2021-01-10 NOTE — Telephone Encounter (Signed)
Please schedule her breast MRI in 11/22 she has CHEK2 genetic mutation and an elevated risk of breast cancer

## 2021-01-15 ENCOUNTER — Telehealth: Payer: Self-pay | Admitting: Family Medicine

## 2021-01-15 DIAGNOSIS — R7303 Prediabetes: Secondary | ICD-10-CM

## 2021-01-15 DIAGNOSIS — Z13220 Encounter for screening for lipoid disorders: Secondary | ICD-10-CM

## 2021-01-15 NOTE — Telephone Encounter (Signed)
-----   Message from Ellamae Sia sent at 01/06/2021 10:09 AM EDT ----- Regarding: Lab orders for Wednesday, 9.14.22  AWV lab orders, please.

## 2021-01-22 ENCOUNTER — Other Ambulatory Visit: Payer: Self-pay

## 2021-01-22 ENCOUNTER — Other Ambulatory Visit: Payer: Medicare Other

## 2021-01-22 ENCOUNTER — Other Ambulatory Visit (INDEPENDENT_AMBULATORY_CARE_PROVIDER_SITE_OTHER): Payer: Medicare Other

## 2021-01-22 ENCOUNTER — Ambulatory Visit: Payer: Medicare Other

## 2021-01-22 DIAGNOSIS — Z13220 Encounter for screening for lipoid disorders: Secondary | ICD-10-CM | POA: Diagnosis not present

## 2021-01-22 DIAGNOSIS — M955 Acquired deformity of pelvis: Secondary | ICD-10-CM | POA: Diagnosis not present

## 2021-01-22 DIAGNOSIS — M9905 Segmental and somatic dysfunction of pelvic region: Secondary | ICD-10-CM | POA: Diagnosis not present

## 2021-01-22 DIAGNOSIS — M9903 Segmental and somatic dysfunction of lumbar region: Secondary | ICD-10-CM | POA: Diagnosis not present

## 2021-01-22 DIAGNOSIS — R7303 Prediabetes: Secondary | ICD-10-CM

## 2021-01-22 DIAGNOSIS — M5136 Other intervertebral disc degeneration, lumbar region: Secondary | ICD-10-CM | POA: Diagnosis not present

## 2021-01-22 LAB — COMPREHENSIVE METABOLIC PANEL
ALT: 27 U/L (ref 0–35)
AST: 25 U/L (ref 0–37)
Albumin: 4.6 g/dL (ref 3.5–5.2)
Alkaline Phosphatase: 49 U/L (ref 39–117)
BUN: 21 mg/dL (ref 6–23)
CO2: 28 mEq/L (ref 19–32)
Calcium: 10 mg/dL (ref 8.4–10.5)
Chloride: 100 mEq/L (ref 96–112)
Creatinine, Ser: 1.07 mg/dL (ref 0.40–1.20)
GFR: 52.82 mL/min — ABNORMAL LOW (ref >60.00)
Glucose, Bld: 112 mg/dL — ABNORMAL HIGH (ref 70–99)
Potassium: 4.6 mEq/L (ref 3.5–5.1)
Sodium: 136 mEq/L (ref 135–145)
Total Bilirubin: 0.4 mg/dL (ref 0.2–1.2)
Total Protein: 7.2 g/dL (ref 6.0–8.3)

## 2021-01-22 LAB — LIPID PANEL
Cholesterol: 228 mg/dL — ABNORMAL HIGH (ref 0–200)
HDL: 87.5 mg/dL (ref >39.00)
LDL Cholesterol: 126 mg/dL — ABNORMAL HIGH (ref 0–99)
NonHDL: 140.21
Total CHOL/HDL Ratio: 3
Triglycerides: 71 mg/dL (ref 0.0–149.0)
VLDL: 14.2 mg/dL (ref 0.0–40.0)

## 2021-01-22 LAB — HEMOGLOBIN A1C: Hgb A1c MFr Bld: 6.1 % (ref 4.6–6.5)

## 2021-01-23 NOTE — Progress Notes (Signed)
No critical labs need to be addressed urgently. We will discuss labs in detail at upcoming office visit.   

## 2021-01-25 ENCOUNTER — Telehealth: Payer: Self-pay

## 2021-01-25 ENCOUNTER — Ambulatory Visit (INDEPENDENT_AMBULATORY_CARE_PROVIDER_SITE_OTHER): Payer: Medicare Other

## 2021-01-25 DIAGNOSIS — Z0000 Encounter for general adult medical examination without abnormal findings: Secondary | ICD-10-CM | POA: Diagnosis not present

## 2021-01-25 NOTE — Patient Instructions (Signed)
Health Maintenance, Female Adopting a healthy lifestyle and getting preventive care are important in promoting health and wellness. Ask your health care provider about: The right schedule for you to have regular tests and exams. Things you can do on your own to prevent diseases and keep yourself healthy. What should I know about diet, weight, and exercise? Eat a healthy diet  Eat a diet that includes plenty of vegetables, fruits, low-fat dairy products, and lean protein. Do not eat a lot of foods that are high in solid fats, added sugars, or sodium. Maintain a healthy weight Body mass index (BMI) is used to identify weight problems. It estimates body fat based on height and weight. Your health care provider can help determine your BMI and help you achieve or maintain a healthy weight. Get regular exercise Get regular exercise. This is one of the most important things you can do for your health. Most adults should: Exercise for at least 150 minutes each week. The exercise should increase your heart rate and make you sweat (moderate-intensity exercise). Do strengthening exercises at least twice a week. This is in addition to the moderate-intensity exercise. Spend less time sitting. Even light physical activity can be beneficial. Watch cholesterol and blood lipids Have your blood tested for lipids and cholesterol at 70 years of age, then have this test every 5 years. Have your cholesterol levels checked more often if: Your lipid or cholesterol levels are high. You are older than 70 years of age. You are at high risk for heart disease. What should I know about cancer screening? Depending on your health history and family history, you may need to have cancer screening at various ages. This may include screening for: Breast cancer. Cervical cancer. Colorectal cancer. Skin cancer. Lung cancer. What should I know about heart disease, diabetes, and high blood pressure? Blood pressure and heart  disease High blood pressure causes heart disease and increases the risk of stroke. This is more likely to develop in people who have high blood pressure readings, are of African descent, or are overweight. Have your blood pressure checked: Every 3-5 years if you are 18-39 years of age. Every year if you are 40 years old or older. Diabetes Have regular diabetes screenings. This checks your fasting blood sugar level. Have the screening done: Once every three years after age 40 if you are at a normal weight and have a low risk for diabetes. More often and at a younger age if you are overweight or have a high risk for diabetes. What should I know about preventing infection? Hepatitis B If you have a higher risk for hepatitis B, you should be screened for this virus. Talk with your health care provider to find out if you are at risk for hepatitis B infection. Hepatitis C Testing is recommended for: Everyone born from 1945 through 1965. Anyone with known risk factors for hepatitis C. Sexually transmitted infections (STIs) Get screened for STIs, including gonorrhea and chlamydia, if: You are sexually active and are younger than 70 years of age. You are older than 70 years of age and your health care provider tells you that you are at risk for this type of infection. Your sexual activity has changed since you were last screened, and you are at increased risk for chlamydia or gonorrhea. Ask your health care provider if you are at risk. Ask your health care provider about whether you are at high risk for HIV. Your health care provider may recommend a prescription medicine   to help prevent HIV infection. If you choose to take medicine to prevent HIV, you should first get tested for HIV. You should then be tested every 3 months for as long as you are taking the medicine. Pregnancy If you are about to stop having your period (premenopausal) and you may become pregnant, seek counseling before you get  pregnant. Take 400 to 800 micrograms (mcg) of folic acid every day if you become pregnant. Ask for birth control (contraception) if you want to prevent pregnancy. Osteoporosis and menopause Osteoporosis is a disease in which the bones lose minerals and strength with aging. This can result in bone fractures. If you are 65 years old or older, or if you are at risk for osteoporosis and fractures, ask your health care provider if you should: Be screened for bone loss. Take a calcium or vitamin D supplement to lower your risk of fractures. Be given hormone replacement therapy (HRT) to treat symptoms of menopause. Follow these instructions at home: Lifestyle Do not use any products that contain nicotine or tobacco, such as cigarettes, e-cigarettes, and chewing tobacco. If you need help quitting, ask your health care provider. Do not use street drugs. Do not share needles. Ask your health care provider for help if you need support or information about quitting drugs. Alcohol use Do not drink alcohol if: Your health care provider tells you not to drink. You are pregnant, may be pregnant, or are planning to become pregnant. If you drink alcohol: Limit how much you use to 0-1 drink a day. Limit intake if you are breastfeeding. Be aware of how much alcohol is in your drink. In the [place] one drink equals one 12 oz bottle of beer (355 mL), one 5 oz glass of wine (148 mL), or one 1 oz glass of hard liquor (44 mL). General instructions Schedule regular health, dental, and eye exams. Stay current with your vaccines. Tell your health care provider if: You often feel depressed. You have ever been abused or do not feel safe at home. Summary Adopting a healthy lifestyle and getting preventive care are important in promoting health and wellness. Follow your health care provider's instructions about healthy diet, exercising, and getting tested or screened for diseases. Follow your health care provider's  instructions on monitoring your cholesterol and blood pressure. This information is not intended to replace advice given to you by your health care provider. Make sure you discuss any questions you have with your health care provider. Document Revised: 07/05/2020 Document Reviewed: 04/20/2018 Elsevier Patient Education  2022 [employer] Inc.  

## 2021-01-25 NOTE — Progress Notes (Signed)
Subjective:   Rachael Sharp is a 70 y.o. female who presents for Medicare Annual (Subsequent) preventive examination.  I connected with  Rachael Sharp on 01/25/21 by an audio only telemedicine application and verified that I am speaking with the correct person using two identifiers.   I discussed the limitations, risks, security and privacy concerns of performing an evaluation and management service by telephone and the availability of in person appointments. I also discussed with the patient that there may be a patient responsible charge related to this service. The patient expressed understanding and verbally consented to this telephonic visit.  Location of Patient: Home Location of Provider: office  Persons present for the audio visit: Murlean Hark, and [employer], RMA  Review of Systems    Defer to PCP      Objective:    There were no vitals filed for this visit. There is no height or weight on file to calculate BMI.  Advanced Directives 01/25/2021 01/23/2020 01/20/2019 01/12/2018 02/10/2017 01/28/2017 01/08/2017  Does Patient Have a Medical Advance Directive? _0  Yes Yes  Type of Advance Directive - [place];Living will [place];Living will [place];Living will [place];Living will [place];Living will [place];Living will  Copy of [place] in Chart? No - copy requested No - copy requested No - copy requested No - copy requested - - No - copy requested    Current Medications (verified) Outpatient Encounter Medications as of 01/25/2021  Medication Sig   amitriptyline (ELAVIL) 10 MG tablet Take 3 tablets (30 mg total) by mouth at bedtime.   aspirin 81 MG EC tablet Take 1 tablet by mouth daily.   BIOTIN PO Take 5,000 mcg by mouth daily.   Glucosamine HCl-MSM (GLUCOSAMINE-MSM PO) Take 1 tablet by  mouth daily.   Multiple Vitamin (MULTIVITAMIN) tablet Take 2 tablets by mouth daily.    Omega-3 Fatty Acids (OMEGA 3 PO) Take 600 mg by mouth daily.    tretinoin (RETIN-A) 0.025 % cream Apply topically at bedtime.   No facility-administered encounter medications on file as of 01/25/2021.    Allergies (verified) Penicillins and Epinephrine   History: Past Medical History:  Diagnosis Date   Arthritis    finger left index   Colon stricture (HCC)    GERD (gastroesophageal reflux disease)    Heart murmur    mitral valve prolapse   Hyperlipidemia    denies 10/21/18,not on medication   Hyperplastic colon polyp    Monoallelic mutation of CHEK2 gene in female patient    Vitamin D deficiency    Past Surgical History:  Procedure Laterality Date   ABDOMINAL HYSTERECTOMY  12/2000   BREAST BIOPSY     biopsy x 2 , 1976 and 2002   COLONOSCOPY     [place]   Family History  Problem Relation Age of Onset   COPD Sister    Heart disease Sister 58       massive CVA,     Stroke Sister    Stroke Mother    COPD Mother    Heart disease Father    Hyperlipidemia Father    Breast cancer Paternal Aunt    Breast cancer Paternal Aunt    Breast cancer Paternal Aunt    Breast cancer Paternal Aunt    Colon cancer Neg Hx  Colon polyps Neg Hx    Esophageal cancer Neg Hx    Rectal cancer Neg Hx    Stomach cancer Neg Hx    Social History   Socioeconomic History   Marital status: Married    Spouse name: Not on file   Number of children: Not on file   Years of education: Not on file   Highest education level: Not on file  Occupational History   Occupation: retired  Tobacco Use   Smoking status: Former    Packs/day: 0.25    Years: 20.00    Pack years: 5.00    Types: Cigarettes    Quit date: 06/02/1989    Years since quitting: 31.6   Smokeless tobacco: Never   Tobacco comments:    Quit 1991  Vaping Use   Vaping Use: Never  used  Substance and Sexual Activity   Alcohol use: Yes    Alcohol/week: 7.0 standard drinks    Types: 7 Shots of liquor per week    Comment: 0-7 drinks a week   Drug use: No   Sexual activity: Not Currently    Partners: Male    Comment: 1st intercourse-19, partners- 67, married- 18 yr s  Other Topics Concern   Not on file  Social History Narrative   Not on file   Social Determinants of Health   Financial Resource Strain: Low Risk    Difficulty of Paying Living Expenses: Not hard at all  Food Insecurity: No Food Insecurity   Worried About Charity fundraiser in the Last Year: Never true   Arboriculturist in the Last Year: Never true  Transportation Needs: No Transportation Needs   Lack of Transportation (Medical): No   Lack of Transportation (Non-Medical): No  Physical Activity: Sufficiently Active   Days of Exercise per Week: 5 days   Minutes of Exercise per Session: 90 min  Stress: No Stress Concern Present   Feeling of Stress : Only a little  Social Connections: Moderately Integrated   Frequency of Communication with Friends and Family: Twice a week   Frequency of Social Gatherings with Friends and Family: Twice a week   Attends Religious Services: Never   Marine scientist or Organizations: Yes   Attends Music therapist: More than 4 times per year   Marital Status: Married    Tobacco Counseling Counseling given: Not Answered Tobacco comments: Quit 1991   Clinical Intake:  Pre-visit preparation completed: Yes  Pain : No/denies pain   BMI - recorded: 24.8 Nutritional Status: BMI of 19-24  Normal Nutritional Risks: None Diabetes: No  How often do you need to have someone help you when you read instructions, pamphlets, or other written materials from your doctor or pharmacy?: 1 - Never What is the last grade level you completed in school?: 14  Diabetic?No  Interpreter Needed?: No  Activities of Daily Living In your present state of  health, do you have any difficulty performing the following activities: 01/25/2021  Hearing? N  Vision? N  Difficulty concentrating or making decisions? N  Walking or climbing stairs? N  Dressing or bathing? N  Doing errands, shopping? N  Some recent data might be hidden    Patient Care Team: Jinny Sanders, MD as PCP - General (Family Medicine)  Indicate any recent Medical Services you may have received from other than [facility] providers in the past year (date may be approximate).     Assessment:   This is a  routine wellness examination for [place].  Hearing/Vision screen Vision Screening - Comments:: Last eye exam within a [place]  Dietary issues and exercise activities discussed:     Goals Addressed   None   Depression Screen PHQ 2/9 Scores 01/25/2021 01/23/2020 01/20/2019 01/12/2018 01/08/2017 11/19/2016 03/09/2012  PHQ - 2 Score 0 0 0 0 0 0 0  PHQ- 9 Score - 0 1 0 - - -    Fall Risk Fall Risk  01/25/2021 01/23/2020 01/20/2019 01/12/2018 01/08/2017  Falls in the past year? 1 1 0 No No  Comment - fell off of ladder - - -  Number falls in past yr: 0 0 - - -  Injury with Fall? 0 0 - - -  Risk for fall due to : - No Fall Risks Impaired balance/gait - -  Risk for fall due to: Comment - - when in pain - -  Follow up - Falls evaluation completed;Falls prevention discussed Falls evaluation completed;Falls prevention discussed - -    FALL RISK PREVENTION PERTAINING TO THE HOME:  Any stairs in or around the home? Yes  If so, are there any without handrails? Yes  Home free of loose throw rugs in walkways, pet beds, electrical cords, etc? Yes  Adequate lighting in your home to reduce risk of falls? Yes   ASSISTIVE DEVICES UTILIZED TO PREVENT FALLS:  Life alert? No  Use of a cane, walker or w/c? No  Grab bars in the bathroom? No  Shower chair or bench in shower? Yes  Elevated toilet seat or a handicapped toilet? No   TIMED UP AND GO:  Was the test performed?   N/a phone visit .    Cognitive Function: MMSE - Mini Mental State Exam 01/23/2020 01/20/2019 01/12/2018  Orientation to time _0 Orientation to Place _1 Registration _2 Attention/ Calculation 5 5 0  Recall _3 Language- name 2 objects - 0 0  Language- repeat _4 Language- follow 3 step command - 0 3  Language- read & follow direction - 0 0  Write a sentence - 0 0  Copy design - 0 0  Total score - 22 20     6CIT Screen 01/25/2021  What Year? 0 points  What month? 0 points  What time? 0 points  Count back from 20 0 points  Months in reverse 0 points  Repeat phrase 0 points  Total Score 0    Immunizations Immunization History  Administered Date(s) Administered   Fluad Quad(high Dose 65+) 01/24/2019, 01/01/2020, 01/16/2021   Influenza Split 03/08/2012, 02/11/2014, 02/09/2015   Influenza Whole 04/05/2011   Influenza, High Dose Seasonal PF 02/25/2017, 01/11/2018   Influenza,inj,Quad PF,6+ Mos 03/13/2013   Influenza-Unspecified 01/30/2016   PFIZER(Purple Top)SARS-COV-2 Vaccination 06/17/2019, 07/12/2019, 03/09/2020   Pfizer Covid-19 Vaccine Bivalent Booster 57yr & up 08/13/2020   Pneumococcal Conjugate-13 01/08/2017   Pneumococcal Polysaccharide-23 03/17/2018   Tdap 11/09/2010   Zoster Recombinat (Shingrix) 06/04/2020   Zoster, Live 03/08/2012    TDAP status: Due, Education has been provided regarding the importance of this vaccine. Advised may receive this vaccine at local pharmacy or Health Dept. Aware to provide a copy of the vaccination record if obtained from local pharmacy or Health Dept. Verbalized acceptance and understanding. Patient was advised provider will discuss at her next office visit  Flu Vaccine status: Up to date  Pneumococcal vaccine status: Up to date  Covid-19 vaccine status:  Completed vaccines will be getting her next Covid booster vaccine this fall 2022  Qualifies for Shingles Vaccine? Yes   Zostavax completed Yes   Shingrix  Completed?: Yes  Screening Tests Health Maintenance  Topic Date Due   COVID-19 Vaccine (4 - Booster for Pfizer series) 07/08/2020   Zoster Vaccines- Shingrix (2 of 2) 07/30/2020   TETANUS/TDAP  11/08/2020   MAMMOGRAM  08/02/2021   COLONOSCOPY (Pts 45-42yrs Insurance coverage will need to be confirmed)  11/02/2021   DEXA SCAN  03/11/2023   INFLUENZA VACCINE  Completed   Hepatitis C Screening  Completed   HPV VACCINES  Aged Out    Health Maintenance  Health Maintenance Due  Topic Date Due   COVID-19 Vaccine (4 - Booster for [place] series) 07/08/2020   Zoster Vaccines- Shingrix (2 of 2) 07/30/2020   TETANUS/TDAP  11/08/2020    Colorectal cancer screening: Type of screening: Colonoscopy. Completed 11/03/2018. Repeat every 3 years  Mammogram status: Completed 08/02/2020. Repeat every year  Bone Density status: Completed 03/10/2018. Results reflect: Bone density results: NORMAL. Repeat every 5 years.  Lung Cancer Screening: (Low Dose CT Chest recommended if Age 56-80 years, 30 pack-year currently smoking OR have quit w/in 15years.) does not qualify.   Lung Cancer Screening Referral: n/a  Additional Screening:  Hepatitis C Screening: does qualify; Completed 08/08/2015  Vision Screening: Recommended annual ophthalmology exams for early detection of glaucoma and other disorders of the eye. Is the patient up to date with their annual eye exam?  Yes  Who is the provider or what is the name of the office in which the patient attends annual eye exams? [place] Screening: Recommended annual dental exams for proper oral hygiene  Community Resource Referral / Chronic Care Management: CRR required this visit?  No   CCM required this visit?  No      Plan:     I have personally reviewed and noted the following in the patient's chart:   Medical and social history Use of alcohol, tobacco or illicit drugs  Current medications and supplements including opioid  prescriptions.  Functional ability and status Nutritional status Physical activity Advanced directives List of other physicians Hospitalizations, surgeries, and ER visits in previous 12 months Vitals Screenings to include cognitive, depression, and falls Referrals and appointments  In addition, I have reviewed and discussed with patient certain preventive protocols, quality metrics, and best practice recommendations. A written personalized care plan for preventive services as well as general preventive health recommendations were provided to patient.     Kris Mouton, CMA   01/25/2021   Nurse Notes: Non-Face to Face 45 minute visit.   Ms. Dault , Thank you for taking time to come for your Medicare Wellness Visit. I appreciate your ongoing commitment to your health goals. Please review the following plan we discussed and let me know if I can assist you in the future.   These are the goals we discussed:  Goals      Patient Stated     Starting 01/12/2018, I will continue to take medications as prescribed.      Patient Stated     01/23/2020, I will continue to walk and weight lift 4-5 days a week for 45-90 minutes.     Weight (lb) < 200 lb (90.7 kg)     01/20/2019, wants to lose 10 pounds        This is a list of the screening recommended for you and due dates:  Health Maintenance  Topic Date Due   COVID-19 Vaccine (4 - Booster for Pfizer series) 07/08/2020   Zoster (Shingles) Vaccine (2 of 2) 07/30/2020   Tetanus Vaccine  11/08/2020   Mammogram  08/02/2021   Colon Cancer Screening  11/02/2021   DEXA scan (bone density measurement)  03/11/2023   Flu Shot  Completed   Hepatitis C Screening: USPSTF Recommendation to screen - Ages 18-79 yo.  Completed   HPV Vaccine  Aged Out

## 2021-01-25 NOTE — Telephone Encounter (Signed)
Had AWV phone call with patient today. Went over past medical history. Patient has Colon stricture noted in her chart and patient states she was never told that she had that and wanted to know when and who added that in the chart. Advised patient I would let Dr Diona Browner know and have her loon into this and we would call her back with that information.

## 2021-01-26 ENCOUNTER — Ambulatory Visit: Payer: Medicare Other

## 2021-01-27 ENCOUNTER — Encounter: Payer: Self-pay | Admitting: Family Medicine

## 2021-01-27 NOTE — Telephone Encounter (Signed)
Patient advised.

## 2021-01-28 ENCOUNTER — Ambulatory Visit (INDEPENDENT_AMBULATORY_CARE_PROVIDER_SITE_OTHER): Payer: Medicare Other | Admitting: Family Medicine

## 2021-01-28 ENCOUNTER — Other Ambulatory Visit: Payer: Self-pay

## 2021-01-28 ENCOUNTER — Encounter: Payer: Self-pay | Admitting: Family Medicine

## 2021-01-28 VITALS — BP 118/78 | HR 70 | Temp 97.3°F | Ht 64.25 in | Wt 146.0 lb

## 2021-01-28 DIAGNOSIS — N904 Leukoplakia of vulva: Secondary | ICD-10-CM

## 2021-01-28 DIAGNOSIS — Z1502 Genetic susceptibility to malignant neoplasm of ovary: Secondary | ICD-10-CM | POA: Diagnosis not present

## 2021-01-28 DIAGNOSIS — Z1509 Genetic susceptibility to other malignant neoplasm: Secondary | ICD-10-CM | POA: Diagnosis not present

## 2021-01-28 DIAGNOSIS — Z1589 Genetic susceptibility to other disease: Secondary | ICD-10-CM

## 2021-01-28 DIAGNOSIS — R7303 Prediabetes: Secondary | ICD-10-CM

## 2021-01-28 DIAGNOSIS — Z1501 Genetic susceptibility to malignant neoplasm of breast: Secondary | ICD-10-CM | POA: Diagnosis not present

## 2021-01-28 MED ORDER — MOLNUPIRAVIR EUA 200MG CAPSULE: 4.0000 | ORAL_CAPSULE | Freq: Two times a day (BID) | ORAL | 0 refills | Status: AC

## 2021-01-28 NOTE — Patient Instructions (Addendum)
Keep up the great work on exercise.  Work on low cholesterol diet.   Preventive Care 70 Years and Older, Female Preventive care refers to lifestyle choices and visits with your health care provider that can promote health and wellness. This includes: A yearly physical exam. This is also called an annual wellness visit. Regular dental and eye exams. Immunizations. Screening for certain conditions. Healthy lifestyle choices, such as: Eating a healthy diet. Getting regular exercise. Not using drugs or products that contain nicotine and tobacco. Limiting alcohol use. What can I expect for my preventive care visit? Physical exam Your health care provider will check your: Height and weight. These may be used to calculate your BMI (body mass index). BMI is a measurement that tells if you are at a healthy weight. Heart rate and blood pressure. Body temperature. Skin for abnormal spots. Counseling Your health care provider may ask you questions about your: Past medical problems. Family's medical history. Alcohol, tobacco, and drug use. Emotional well-being. Home life and relationship well-being. Sexual activity. Diet, exercise, and sleep habits. History of falls. Memory and ability to understand (cognition). Work and work Statistician. Pregnancy and menstrual history. Access to firearms. What immunizations do I need? Vaccines are usually given at various ages, according to a schedule. Your health care provider will recommend vaccines for you based on your age, medical history, and lifestyle or other factors, such as travel or where you work. What tests do I need? Blood tests Lipid and cholesterol levels. These may be checked every 5 years, or more often depending on your overall health. Hepatitis C test. Hepatitis B test. Screening Lung cancer screening. You may have this screening every year starting at age 21 if you have a 30-pack-year history of smoking and currently smoke or  have quit within the past 15 years. Colorectal cancer screening. All adults should have this screening starting at age 15 and continuing until age 14. Your health care provider may recommend screening at age 62 if you are at increased risk. You will have tests every 1-10 years, depending on your results and the type of screening test. Diabetes screening. This is done by checking your blood sugar (glucose) after you have not eaten for a while (fasting). You may have this done every 1-3 years. Mammogram. This may be done every 1-2 years. Talk with your health care provider about how often you should have regular mammograms. Abdominal aortic aneurysm (AAA) screening. You may need this if you are a current or former smoker. BRCA-related cancer screening. This may be done if you have a family history of breast, ovarian, tubal, or peritoneal cancers. Other tests STD (sexually transmitted disease) testing, if you are at risk. Bone density scan. This is done to screen for osteoporosis. You may have this done starting at age 54. Talk with your health care provider about your test results, treatment options, and if necessary, the need for more tests. Follow these instructions at home: Eating and drinking  Eat a diet that includes fresh fruits and vegetables, whole grains, lean protein, and low-fat dairy products. Limit your intake of foods with high amounts of sugar, saturated fats, and salt. Take vitamin and mineral supplements as recommended by your health care provider. Do not drink alcohol if your health care provider tells you not to drink. If you drink alcohol: Limit how much you have to 0-1 drink a day. Be aware of how much alcohol is in your drink. In the [place] one drink equals one 12 oz  bottle of beer (355 mL), one 5 oz glass of wine (148 mL), or one 1 oz glass of hard liquor (44 mL). Lifestyle Take daily care of your teeth and gums. Brush your teeth every morning and night with fluoride  toothpaste. Floss one time each day. Stay active. Exercise for at least 30 minutes 5 or more days each week. Do not use any products that contain nicotine or tobacco, such as cigarettes, e-cigarettes, and chewing tobacco. If you need help quitting, ask your health care provider. Do not use drugs. If you are sexually active, practice safe sex. Use a condom or other form of protection in order to prevent STIs (sexually transmitted infections). Talk with your health care provider about taking a low-dose aspirin or statin. Find healthy ways to cope with stress, such as: Meditation, yoga, or listening to music. Journaling. Talking to a trusted person. Spending time with friends and family. Safety Always wear your seat belt while driving or riding in a vehicle. Do not drive: If you have been drinking alcohol. Do not ride with someone who has been drinking. When you are tired or distracted. While texting. Wear a helmet and other protective equipment during sports activities. If you have firearms in your house, make sure you follow all gun safety procedures. What's next? Visit your health care provider once a year for an annual wellness visit. Ask your health care provider how often you should have your eyes and teeth checked. Stay up to date on all vaccines. This information is not intended to replace advice given to you by your health care provider. Make sure you discuss any questions you have with your health care provider. Document Revised: 07/05/2020 Document Reviewed: 04/21/2018 Elsevier Patient Education  2022 [employer].

## 2021-01-28 NOTE — Progress Notes (Addendum)
Patient ID: Rachael Sharp, female    DOB: December 06, 1950, 70 y.o.   MRN: [id number]  This visit was conducted in person.  BP 118/78   Pulse 70   Temp (!) 97.3 F (36.3 C) (Temporal)   Ht 5' 4.25" (1.632 m)   Wt 146 lb (66.2 kg)   SpO2 99%   BMI 24.87 kg/m    CC: Review of Chronic health Issues  Subjective:   HPI: Rachael Sharp is a 70 y.o. female presenting on 01/28/2021 for   The patient presents for  review of chronic health problems. He/She also has the following acute concerns today: none   The patient saw a LPN or RN for medicare wellness visit.  Prevention and wellness was reviewed in detail. Note reviewed and important notes copied below.   In past elevated BP no dx of HTN.. now in normal range. BP Readings from Last 3 Encounters:  01/28/21 118/78  07/25/20 (!) 144/92  01/26/20 118/60   Diet: Healthy Exercise:  walking 4-5 times a week.  Reviewed labs in detail. Lab Results  Component Value Date   CHOL 228 (H) 01/22/2021   HDL 87.50 01/22/2021   LDLCALC 126 (H) 01/22/2021   LDLDIRECT 149.0 03/22/2013   TRIG 71.0 01/22/2021   CHOLHDL 3 01/22/2021  The 10-year ASCVD risk score (Arnett DK, et al., 2019) is: 7.8%   Values used to calculate the score:     Age: 48 years     Sex: Female     Is Non-Hispanic African American: No     Diabetic: No     Tobacco smoker: No     Systolic Blood Pressure: 956 mmHg     Is BP treated: No     HDL Cholesterol: 87.5 mg/dL     Total Cholesterol: 228 mg/dL   Prediabetes  Lab Results  Component Value Date   HGBA1C 6.1 01/22/2021     Vulvadynia: Stable on amitriptyline 30 mg at bedtime.   Relevant past medical, surgical, family and social history reviewed and updated as indicated. Interim medical history since our last visit reviewed. Allergies and medications reviewed and updated. Outpatient Medications Prior to Visit  Medication Sig Dispense Refill   amitriptyline (ELAVIL) 10 MG tablet Take 3 tablets  (30 mg total) by mouth at bedtime. 270 tablet 3   aspirin 81 MG EC tablet Take 1 tablet by mouth daily.     BIOTIN PO Take 5,000 mcg by mouth daily.     Glucosamine HCl-MSM (GLUCOSAMINE-MSM PO) Take 1 tablet by mouth daily.     Multiple Vitamin (MULTIVITAMIN) tablet Take 2 tablets by mouth daily.      Omega-3 Fatty Acids (OMEGA 3 PO) Take 600 mg by mouth daily.      tretinoin (RETIN-A) 0.025 % cream Apply topically at bedtime.     No facility-administered medications prior to visit.     Per HPI unless specifically indicated in ROS section below Review of Systems  Constitutional:  Negative for fatigue and fever.  HENT:  Negative for congestion.   Eyes:  Negative for pain.  Respiratory:  Negative for cough and shortness of breath.   Cardiovascular:  Negative for chest pain, palpitations and leg swelling.  Gastrointestinal:  Negative for abdominal pain.  Genitourinary:  Negative for dysuria and vaginal bleeding.  Musculoskeletal:  Negative for back pain.  Neurological:  Negative for syncope, light-headedness and headaches.  Psychiatric/Behavioral:  Negative for dysphoric mood.   Objective:  BP 118/78  Pulse 70   Temp (!) 97.3 F (36.3 C) (Temporal)   Ht 5' 4.25" (1.632 m)   Wt 146 lb (66.2 kg)   SpO2 99%   BMI 24.87 kg/m   Wt Readings from Last 3 Encounters:  01/28/21 146 lb (66.2 kg)  07/25/20 154 lb (69.9 kg)  01/26/20 146 lb 12 oz (66.6 kg)      Physical Exam Constitutional:      General: She is not in acute distress.    Appearance: Normal appearance. She is well-developed. She is not ill-appearing or toxic-appearing.  HENT:     Head: Normocephalic.     Right Ear: Hearing, tympanic membrane, ear canal and external ear normal. Tympanic membrane is not erythematous, retracted or bulging.     Left Ear: Hearing, tympanic membrane, ear canal and external ear normal. Tympanic membrane is not erythematous, retracted or bulging.     Nose: No mucosal edema or rhinorrhea.      Right Sinus: No maxillary sinus tenderness or frontal sinus tenderness.     Left Sinus: No maxillary sinus tenderness or frontal sinus tenderness.     Mouth/Throat:     Pharynx: Uvula midline.  Eyes:     General: Lids are normal. Lids are everted, no foreign bodies appreciated.     Conjunctiva/sclera: Conjunctivae normal.     Pupils: Pupils are equal, round, and reactive to light.  Neck:     Thyroid: No thyroid mass or thyromegaly.     Vascular: No carotid bruit.     Trachea: Trachea normal.  Cardiovascular:     Rate and Rhythm: Normal rate and regular rhythm.     Pulses: Normal pulses.     Heart sounds: Normal heart sounds, S1 normal and S2 normal. No murmur heard.   No friction rub. No gallop.  Pulmonary:     Effort: Pulmonary effort is normal. No tachypnea or respiratory distress.     Breath sounds: Normal breath sounds. No decreased breath sounds, wheezing, rhonchi or rales.  Abdominal:     General: Bowel sounds are normal.     Palpations: Abdomen is soft.     Tenderness: There is no abdominal tenderness.  Musculoskeletal:     Cervical back: Normal range of motion and neck supple.  Skin:    General: Skin is warm and dry.     Findings: No rash.  Neurological:     Mental Status: She is alert.  Psychiatric:        Mood and Affect: Mood is not anxious or depressed.        Speech: Speech normal.        Behavior: Behavior normal. Behavior is cooperative.        Thought Content: Thought content normal.        Judgment: Judgment normal.      Results for orders placed or performed in visit on 01/22/21  Comprehensive metabolic panel  Result Value Ref Range   Sodium 136 135 - 145 mEq/L   Potassium 4.6 3.5 - 5.1 mEq/L   Chloride 100 96 - 112 mEq/L   CO2 28 19 - 32 mEq/L   Glucose, Bld 112 (H) 70 - 99 mg/dL   BUN 21 6 - 23 mg/dL   Creatinine, Ser 1.07 0.40 - 1.20 mg/dL   Total Bilirubin 0.4 0.2 - 1.2 mg/dL   Alkaline Phosphatase 49 39 - 117 U/L   AST 25 0 - 37 U/L   ALT 27  0 - 35 U/L  Total Protein 7.2 6.0 - 8.3 g/dL   Albumin 4.6 3.5 - 5.2 g/dL   GFR 52.82 (L) >60.00 mL/min   Calcium 10.0 8.4 - 10.5 mg/dL  Lipid panel  Result Value Ref Range   Cholesterol 228 (H) 0 - 200 mg/dL   Triglycerides 71.0 0.0 - 149.0 mg/dL   HDL 87.50 >39.00 mg/dL   VLDL 14.2 0.0 - 40.0 mg/dL   LDL Cholesterol 126 (H) 0 - 99 mg/dL   Total CHOL/HDL Ratio 3    NonHDL 140.21   Hemoglobin A1c  Result Value Ref Range   Hgb A1c MFr Bld 6.1 4.6 - 6.5 %    This visit occurred during the SARS-CoV-2 public health emergency.  Safety protocols were in place, including screening questions prior to the visit, additional usage of staff PPE, and extensive cleaning of exam room while observing appropriate contact time as indicated for disinfecting solutions.   COVID 19 screen:  No recent travel or known exposure to COVID19 The patient denies respiratory symptoms of COVID 19 at this time. The importance of social distancing was discussed today.   Assessment and Plan   The patient's preventative maintenance and recommended screening tests for an annual wellness exam were reviewed in full today. Brought up to date unless services declined.  Counselled on the importance of diet, exercise, and its role in overall health and mortality. The patient's FH and SH was reviewed, including their home life, tobacco status, and drug and alcohol status.   Colonoscopy every 3 years. Dr Hilarie Fredrickson, last 10/2018 PAP/DVE not indicated,  TAH.  Now seeing GYN  Dr. Dellis Filbert. Vaccines:uptodate, flu done, S/P COVID19 x3 and PNA series. S/P shingirx,   Flu 01/16/2021 Hep C: done  Mammogram: Chek 2 mutation. Annual mammogram with breast MRI  ( she has requested I order this)(next MRI 03/2021, already scheduled) . Needs breast exam twice a year. Mammogram 07/2020.Marland Kitchen alternates q 6 month with MRI.  Bone density: 02/2018 normal, repeat in 5 years.  Problem List Items Addressed This Visit     Dystrophy, vulva/ vulvadynia     Stable control on amitriptyline.      Elevated blood pressure reading in office without diagnosis of hypertension - Primary    Resolved.      Monoallelic mutation of CHEK2 gene in female patient     She requests our office to perform q6 month DBE.  She will need breast MRI yearly in [place] and screening mammogram yearly in  April      Prediabetes    Encouraged exercise, weight loss, healthy eating habits.       She is going to [place].. Reviewed COVID prevention and she will take a prescription of molnupiravir with her. Reviewed SE and ER precautions if she gets sick.  Eliezer Lofts, MD

## 2021-01-28 NOTE — Assessment & Plan Note (Signed)
Stable control on amitriptyline.

## 2021-01-28 NOTE — Assessment & Plan Note (Addendum)
She requests our office to perform q6 month DBE.  She will need breast MRI yearly in [place] and screening mammogram yearly in  April

## 2021-01-28 NOTE — Assessment & Plan Note (Signed)
Resolved

## 2021-01-28 NOTE — Assessment & Plan Note (Signed)
Encouraged exercise, weight loss, healthy eating habits. ? ?

## 2021-01-29 DIAGNOSIS — M9905 Segmental and somatic dysfunction of pelvic region: Secondary | ICD-10-CM | POA: Diagnosis not present

## 2021-01-29 DIAGNOSIS — M955 Acquired deformity of pelvis: Secondary | ICD-10-CM | POA: Diagnosis not present

## 2021-01-29 DIAGNOSIS — M5136 Other intervertebral disc degeneration, lumbar region: Secondary | ICD-10-CM | POA: Diagnosis not present

## 2021-01-29 DIAGNOSIS — M9903 Segmental and somatic dysfunction of lumbar region: Secondary | ICD-10-CM | POA: Diagnosis not present

## 2021-01-30 DIAGNOSIS — Z23 Encounter for immunization: Secondary | ICD-10-CM | POA: Diagnosis not present

## 2021-02-05 DIAGNOSIS — M955 Acquired deformity of pelvis: Secondary | ICD-10-CM | POA: Diagnosis not present

## 2021-02-05 DIAGNOSIS — M5136 Other intervertebral disc degeneration, lumbar region: Secondary | ICD-10-CM | POA: Diagnosis not present

## 2021-02-05 DIAGNOSIS — M9903 Segmental and somatic dysfunction of lumbar region: Secondary | ICD-10-CM | POA: Diagnosis not present

## 2021-02-05 DIAGNOSIS — M9905 Segmental and somatic dysfunction of pelvic region: Secondary | ICD-10-CM | POA: Diagnosis not present

## 2021-02-20 DIAGNOSIS — M9905 Segmental and somatic dysfunction of pelvic region: Secondary | ICD-10-CM | POA: Diagnosis not present

## 2021-02-20 DIAGNOSIS — M5136 Other intervertebral disc degeneration, lumbar region: Secondary | ICD-10-CM | POA: Diagnosis not present

## 2021-02-20 DIAGNOSIS — M955 Acquired deformity of pelvis: Secondary | ICD-10-CM | POA: Diagnosis not present

## 2021-02-20 DIAGNOSIS — M9903 Segmental and somatic dysfunction of lumbar region: Secondary | ICD-10-CM | POA: Diagnosis not present

## 2021-03-06 DIAGNOSIS — M9903 Segmental and somatic dysfunction of lumbar region: Secondary | ICD-10-CM | POA: Diagnosis not present

## 2021-03-06 DIAGNOSIS — M955 Acquired deformity of pelvis: Secondary | ICD-10-CM | POA: Diagnosis not present

## 2021-03-06 DIAGNOSIS — M5136 Other intervertebral disc degeneration, lumbar region: Secondary | ICD-10-CM | POA: Diagnosis not present

## 2021-03-06 DIAGNOSIS — M9905 Segmental and somatic dysfunction of pelvic region: Secondary | ICD-10-CM | POA: Diagnosis not present

## 2021-03-10 DIAGNOSIS — M955 Acquired deformity of pelvis: Secondary | ICD-10-CM | POA: Diagnosis not present

## 2021-03-10 DIAGNOSIS — M9905 Segmental and somatic dysfunction of pelvic region: Secondary | ICD-10-CM | POA: Diagnosis not present

## 2021-03-10 DIAGNOSIS — M9903 Segmental and somatic dysfunction of lumbar region: Secondary | ICD-10-CM | POA: Diagnosis not present

## 2021-03-10 DIAGNOSIS — M5136 Other intervertebral disc degeneration, lumbar region: Secondary | ICD-10-CM | POA: Diagnosis not present

## 2021-03-14 DIAGNOSIS — M5136 Other intervertebral disc degeneration, lumbar region: Secondary | ICD-10-CM | POA: Diagnosis not present

## 2021-03-14 DIAGNOSIS — M955 Acquired deformity of pelvis: Secondary | ICD-10-CM | POA: Diagnosis not present

## 2021-03-14 DIAGNOSIS — M9903 Segmental and somatic dysfunction of lumbar region: Secondary | ICD-10-CM | POA: Diagnosis not present

## 2021-03-14 DIAGNOSIS — M9905 Segmental and somatic dysfunction of pelvic region: Secondary | ICD-10-CM | POA: Diagnosis not present

## 2021-03-28 DIAGNOSIS — M9905 Segmental and somatic dysfunction of pelvic region: Secondary | ICD-10-CM | POA: Diagnosis not present

## 2021-03-28 DIAGNOSIS — M5136 Other intervertebral disc degeneration, lumbar region: Secondary | ICD-10-CM | POA: Diagnosis not present

## 2021-03-28 DIAGNOSIS — M9903 Segmental and somatic dysfunction of lumbar region: Secondary | ICD-10-CM | POA: Diagnosis not present

## 2021-03-28 DIAGNOSIS — M955 Acquired deformity of pelvis: Secondary | ICD-10-CM | POA: Diagnosis not present

## 2021-04-01 ENCOUNTER — Ambulatory Visit
Admission: RE | Admit: 2021-04-01 | Discharge: 2021-04-01 | Disposition: A | Discharge status: Home / Self Care | Payer: Medicare Other | Source: Ambulatory Visit | Attending: Obstetrics & Gynecology | Admitting: Obstetrics & Gynecology

## 2021-04-01 DIAGNOSIS — Z1509 Genetic susceptibility to other malignant neoplasm: Secondary | ICD-10-CM

## 2021-04-01 DIAGNOSIS — Z1501 Genetic susceptibility to malignant neoplasm of breast: Secondary | ICD-10-CM

## 2021-04-01 DIAGNOSIS — N6489 Other specified disorders of breast: Secondary | ICD-10-CM | POA: Diagnosis not present

## 2021-04-01 MED ORDER — GADOBENATE DIMEGLUMINE 529 MG/ML IV SOLN
6.0000 mL | Freq: Once | INTRAVENOUS | Status: AC | PRN
  Administered 2021-04-01: 7 mL via INTRAVENOUS

## 2021-04-10 ENCOUNTER — Encounter: Payer: Self-pay | Admitting: Obstetrics & Gynecology

## 2021-04-10 NOTE — Telephone Encounter (Signed)
Please advise what to recommend to patient.

## 2021-04-11 DIAGNOSIS — M9905 Segmental and somatic dysfunction of pelvic region: Secondary | ICD-10-CM | POA: Diagnosis not present

## 2021-04-11 DIAGNOSIS — M955 Acquired deformity of pelvis: Secondary | ICD-10-CM | POA: Diagnosis not present

## 2021-04-11 DIAGNOSIS — M5136 Other intervertebral disc degeneration, lumbar region: Secondary | ICD-10-CM | POA: Diagnosis not present

## 2021-04-11 DIAGNOSIS — M9903 Segmental and somatic dysfunction of lumbar region: Secondary | ICD-10-CM | POA: Diagnosis not present

## 2021-04-17 ENCOUNTER — Ambulatory Visit
Admission: EM | Admit: 2021-04-17 | Discharge: 2021-04-17 | Payer: Medicare Other | Attending: Emergency Medicine | Admitting: Emergency Medicine

## 2021-04-17 ENCOUNTER — Ambulatory Visit (INDEPENDENT_AMBULATORY_CARE_PROVIDER_SITE_OTHER): Payer: Medicare Other

## 2021-04-17 ENCOUNTER — Encounter: Payer: Self-pay | Admitting: Emergency Medicine

## 2021-04-17 DIAGNOSIS — S92352A Displaced fracture of fifth metatarsal bone, left foot, initial encounter for closed fracture: Secondary | ICD-10-CM

## 2021-04-17 DIAGNOSIS — M79672 Pain in left foot: Secondary | ICD-10-CM | POA: Diagnosis not present

## 2021-04-17 DIAGNOSIS — M797 Fibromyalgia: Secondary | ICD-10-CM | POA: Diagnosis not present

## 2021-04-17 NOTE — Discharge Instructions (Addendum)
Take Tylenol or ibuprofen as needed for discomfort.  Rest and elevate your foot.  Apply ice packs 2-3 times a day for up to 20 minutes each.  Wear the walking boot and use the crutches.  Do not bear weight on your foot.   Follow up with an orthopedist such as the one listed below.

## 2021-04-17 NOTE — ED Provider Notes (Addendum)
[facility]    CSN: [id number] Arrival date & time: 04/17/21  5784      History   Chief Complaint Chief Complaint  Patient presents with   Foot Injury    HPI Rachael Sharp is a 70 y.o. female.  Patient presents with left foot pain and bruising after she injured it last night.  She was walking fast in her house in the dark and walked into a box; her foot twisted.  The pain occurs with weight bearing.  No open wounds, numbness, weakness, or other symptoms.  Treatment at home with rest and elevation.    The history is provided by the patient and medical records.   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   Renal insufficiency 01/14/2018   Prediabetes 01/08/2017   Mitral valve prolapse 11/19/2016   Bilateral hip bursitis 11/19/2016   Menopausal syndrome 69/62/9528   Monoallelic mutation of CHEK2 gene in female patient 07/28/2015   Dystrophy, vulva/ vulvadynia 06/25/2011   Heart murmur     Past Surgical History:  Procedure Laterality Date   ABDOMINAL HYSTERECTOMY  12/2000   BREAST BIOPSY     biopsy x 2 , 1976 and 2002   COLONOSCOPY     LIPOMA EXCISION  1983   TONSILLECTOMY  1964   TUBAL LIGATION  1982    OB History     Gravida  0   Para  0   Term  0   Preterm  0   AB  0   Living  0      SAB  0   IAB  0   Ectopic  0   Multiple  0   Live Births  0            Home Medications    Prior to Admission medications   Medication Sig Start Date End Date Taking? Authorizing Provider  amitriptyline (ELAVIL) 10 MG tablet Take 3 tablets (30 mg total) by mouth at  bedtime. 08/02/20  Yes Princess Bruins, MD  tretinoin (RETIN-A) 0.025 % cream Apply topically at bedtime.   Yes [provider]  aspirin 81 MG EC tablet Take 1 tablet by mouth daily. 01/25/19   [provider]  BIOTIN PO Take 5,000 mcg by mouth daily.    [provider]  Glucosamine HCl-MSM (GLUCOSAMINE-MSM PO) Take 1 tablet by mouth daily.    [provider]  Multiple Vitamin (MULTIVITAMIN) tablet Take 2 tablets by mouth daily.     [provider]  Omega-3 Fatty Acids (OMEGA 3 PO) Take 600 mg by mouth daily.     [provider]    Family History Family History  Problem Relation Age of Onset   COPD Sister    Heart disease Sister 6       massive CVA,     Stroke Sister    Stroke Mother    COPD Mother    Heart disease Father    Hyperlipidemia Father    Breast cancer Paternal Aunt    Breast cancer Paternal  Aunt    Breast cancer Paternal Aunt    Breast cancer Paternal Aunt    Colon cancer Neg Hx    Colon polyps Neg Hx    Esophageal cancer Neg Hx    Rectal cancer Neg Hx    Stomach cancer Neg Hx     Social History Social History   Tobacco Use   Smoking status: Former    Packs/day: 0.25    Years: 20.00    Pack years: 5.00    Types: Cigarettes    Quit date: 06/02/1989    Years since quitting: 31.8   Smokeless tobacco: Never   Tobacco comments:    Quit 1991  Vaping Use   Vaping Use: Never used  Substance Use Topics   Alcohol use: Yes    Alcohol/week: 7.0 standard drinks    Types: 7 Shots of liquor per week    Comment: 0-7 drinks a week   Drug use: No     Allergies   Penicillins and Epinephrine   Review of Systems Review of Systems  Musculoskeletal:  Positive for arthralgias, gait problem and joint swelling.  Skin:  Positive for color change. Negative for rash and wound.  Neurological:  Negative for weakness and numbness.  All other systems reviewed and are negative.   Physical Exam Triage Vital Signs ED  Triage Vitals  Enc Vitals Group     BP      Pulse      Resp      Temp      Temp src      SpO2      Weight      Height      Head Circumference      Peak Flow      Pain Score      Pain Loc      Pain Edu?      Excl. in [place]?    No data found.  Updated Vital Signs BP 137/87 (BP Location: Left Arm)   Pulse 86   Temp 98.1 F (36.7 C)   Resp 18   SpO2 98%   Visual Acuity Right Eye Distance:   Left Eye Distance:   Bilateral Distance:    Right Eye Near:   Left Eye Near:    Bilateral Near:     Physical Exam Vitals and nursing note reviewed.  Constitutional:      General: She is not in acute distress.    Appearance: She is well-developed.  HENT:     Mouth/Throat:     Mouth: Mucous membranes are moist.  Cardiovascular:     Rate and Rhythm: Normal rate and regular rhythm.     Heart sounds: Normal heart sounds.  Pulmonary:     Effort: Pulmonary effort is normal. No respiratory distress.     Breath sounds: Normal breath sounds.  Abdominal:     Palpations: Abdomen is soft.     Tenderness: There is no abdominal tenderness.  Musculoskeletal:        General: Swelling and tenderness present. Normal range of motion.     Cervical back: Neck supple.       Feet:  Skin:    General: Skin is warm and dry.     Capillary Refill: Capillary refill takes less than 2 seconds.     Findings: Bruising present. No lesion.  Neurological:     General: No focal deficit present.     Mental Status: She is alert and oriented to person, place, and time.  Sensory: No sensory deficit.     Motor: No weakness.     Gait: Gait abnormal.  Psychiatric:        Mood and Affect: Mood normal.        Behavior: Behavior normal.     UC Treatments / Results  Labs (all labs ordered are listed, but only abnormal results are displayed) Labs Reviewed - No data to display  EKG   Radiology DG Foot Complete Left  Result Date: 04/17/2021 CLINICAL DATA:  Foot injury, pain EXAM: LEFT FOOT - COMPLETE  3+ VIEW COMPARISON:  None. FINDINGS: Degenerative changes and hallux valgus deformity at the 1st MTP. Oblique minimally displaced fracture noted through the mid to distal 5th metatarsal. No subluxation or dislocation. IMPRESSION: Oblique, minimally displaced 5th metatarsal fracture. Degenerative changes at the 1st MTP joint with hallux valgus deformity. Electronically Signed   By: Rolm Baptise M.D.   On: 04/17/2021 10:26    Procedures Procedures (including critical care time)  Medications Ordered in UC Medications - No data to display  Initial Impression / Assessment and Plan / UC Course  I have reviewed the triage vital signs and the nursing notes.  Pertinent labs & imaging results that were available during my care of the patient were reviewed by me and considered in my medical decision making (see chart for details).   Fracture of left 5th metatarsal.  We do not have a walking boot that will fit the patient.  Wrapped in an Ace wrap and provided crutches.  Sending her to [facility] for evaluation; contact information for [facility] provided.   Instructed patient to avoid weight bearing.  Discussed Tylenol or ibuprofen as needed for discomfort until seen by Ortho.  Discussed rest, elevation, ice. Patient agrees to plan of care.     Final Clinical Impressions(s) / UC Diagnoses   Final diagnoses:  Closed displaced fracture of fifth metatarsal bone of left foot, initial encounter     Discharge Instructions      Take Tylenol or ibuprofen as needed for discomfort.  Rest and elevate your foot.  Apply ice packs 2-3 times a day for up to 20 minutes each.  Wear the walking boot and use the crutches.  Do not bear weight on your foot.   Follow up with an orthopedist such as the one listed below.        ED Prescriptions   None    PDMP not reviewed this encounter.   Sharion Balloon, NP 04/17/21 [place], NP 04/17/21 1053

## 2021-04-17 NOTE — ED Triage Notes (Signed)
Pt ran into something and injured her left foot yesterday.

## 2021-04-28 DIAGNOSIS — M79672 Pain in left foot: Secondary | ICD-10-CM | POA: Diagnosis not present

## 2021-05-20 ENCOUNTER — Other Ambulatory Visit: Payer: Self-pay | Admitting: Family Medicine

## 2021-05-20 DIAGNOSIS — Z1231 Encounter for screening mammogram for malignant neoplasm of breast: Secondary | ICD-10-CM

## 2021-05-29 DIAGNOSIS — S92352D Displaced fracture of fifth metatarsal bone, left foot, subsequent encounter for fracture with routine healing: Secondary | ICD-10-CM | POA: Diagnosis not present

## 2021-05-29 DIAGNOSIS — M79672 Pain in left foot: Secondary | ICD-10-CM | POA: Diagnosis not present

## 2021-07-04 ENCOUNTER — Encounter: Payer: Self-pay | Admitting: Family Medicine

## 2021-07-04 ENCOUNTER — Other Ambulatory Visit: Payer: Self-pay

## 2021-07-04 ENCOUNTER — Ambulatory Visit (INDEPENDENT_AMBULATORY_CARE_PROVIDER_SITE_OTHER): Payer: Medicare Other | Admitting: Family Medicine

## 2021-07-04 VITALS — BP 120/80 | HR 72 | Temp 98.1°F | Ht 64.25 in | Wt 158.6 lb

## 2021-07-04 DIAGNOSIS — Z1509 Genetic susceptibility to other malignant neoplasm: Secondary | ICD-10-CM

## 2021-07-04 DIAGNOSIS — Z1502 Genetic susceptibility to malignant neoplasm of ovary: Secondary | ICD-10-CM

## 2021-07-04 DIAGNOSIS — Z1239 Encounter for other screening for malignant neoplasm of breast: Secondary | ICD-10-CM

## 2021-07-04 DIAGNOSIS — Z1501 Genetic susceptibility to malignant neoplasm of breast: Secondary | ICD-10-CM | POA: Diagnosis not present

## 2021-07-04 DIAGNOSIS — Z1589 Genetic susceptibility to other disease: Secondary | ICD-10-CM

## 2021-07-04 NOTE — Assessment & Plan Note (Addendum)
Normal CBE. Conitnue MR and mammogram annually.

## 2021-07-04 NOTE — Progress Notes (Signed)
Patient ID: Rachael Sharp, female    DOB: 02-28-1951, 71 y.o.   MRN: [id number]  This visit was conducted in person.  BP 120/80    Pulse 72    Temp 98.1 F (36.7 C) (Oral)    Ht 5' 4.25" (1.632 m)    Wt 158 lb 9 oz (71.9 kg) Comment: wearing cam walker   SpO2 98%    BMI 27.01 kg/m    CC:  Chief Complaint  Patient presents with   Breast Exam    Due to mutation on CHEK2 gene    Subjective:   HPI: Rachael Sharp is a 71 y.o. female presenting on 07/04/2021 for Breast Exam (Due to mutation on CHEK2 gene)   She has undergone genetic testing ,. She is positive for the CHEK 2 gene  Which estimates a  25 to 44% lifetime risk of BRCA and and 10% increased risk of Colon Ca. Advised to have a CBE  twice year,  And annual imaging with both MRI and mammaogram ( something every 6 months )      Relevant past medical, surgical, family and social history reviewed and updated as indicated. Interim medical history since our last visit reviewed. Allergies and medications reviewed and updated. Outpatient Medications Prior to Visit  Medication Sig Dispense Refill   amitriptyline (ELAVIL) 10 MG tablet Take 3 tablets (30 mg total) by mouth at bedtime. 270 tablet 3   aspirin 81 MG EC tablet Take 1 tablet by mouth daily.     BIOTIN PO Take 5,000 mcg by mouth daily.     Glucosamine HCl-MSM (GLUCOSAMINE-MSM PO) Take 1 tablet by mouth daily.     Multiple Vitamin (MULTIVITAMIN) tablet Take 2 tablets by mouth daily.      Omega-3 Fatty Acids (OMEGA 3 PO) Take 600 mg by mouth daily.      tretinoin (RETIN-A) 0.025 % cream Apply topically at bedtime.     No facility-administered medications prior to visit.     Per HPI unless specifically indicated in ROS section below Review of Systems  Constitutional:  Negative for fatigue and fever.  HENT:  Negative for ear pain.   Eyes:  Negative for pain.  Respiratory:  Negative for chest tightness and shortness of breath.   Cardiovascular:  Negative for  chest pain, palpitations and leg swelling.  Gastrointestinal:  Negative for abdominal pain.  Genitourinary:  Negative for dysuria.  Objective:  BP 120/80    Pulse 72    Temp 98.1 F (36.7 C) (Oral)    Ht 5' 4.25" (1.632 m)    Wt 158 lb 9 oz (71.9 kg) Comment: wearing cam walker   SpO2 98%    BMI 27.01 kg/m   Wt Readings from Last 3 Encounters:  07/04/21 158 lb 9 oz (71.9 kg)  01/28/21 146 lb (66.2 kg)  07/25/20 154 lb (69.9 kg)      Physical Exam Exam conducted with a chaperone present.  Chest:     Chest wall: No mass, lacerations, tenderness or edema.  Breasts:    Breasts are symmetrical.     Right: Normal. No inverted nipple, mass or tenderness.     Left: Normal. No inverted nipple, mass or tenderness.  Lymphadenopathy:     Upper Body:     Right upper body: No supraclavicular, axillary or pectoral adenopathy.     Left upper body: No supraclavicular, axillary or pectoral adenopathy.      Results for orders placed or performed in  visit on 01/22/21  Comprehensive metabolic panel  Result Value Ref Range   Sodium 136 135 - 145 mEq/L   Potassium 4.6 3.5 - 5.1 mEq/L   Chloride 100 96 - 112 mEq/L   CO2 28 19 - 32 mEq/L   Glucose, Bld 112 (H) 70 - 99 mg/dL   BUN 21 6 - 23 mg/dL   Creatinine, Ser 1.07 0.40 - 1.20 mg/dL   Total Bilirubin 0.4 0.2 - 1.2 mg/dL   Alkaline Phosphatase 49 39 - 117 U/L   AST 25 0 - 37 U/L   ALT 27 0 - 35 U/L   Total Protein 7.2 6.0 - 8.3 g/dL   Albumin 4.6 3.5 - 5.2 g/dL   GFR 52.82 (L) >60.00 mL/min   Calcium 10.0 8.4 - 10.5 mg/dL  Lipid panel  Result Value Ref Range   Cholesterol 228 (H) 0 - 200 mg/dL   Triglycerides 71.0 0.0 - 149.0 mg/dL   HDL 87.50 >39.00 mg/dL   VLDL 14.2 0.0 - 40.0 mg/dL   LDL Cholesterol 126 (H) 0 - 99 mg/dL   Total CHOL/HDL Ratio 3    NonHDL 140.21   Hemoglobin A1c  Result Value Ref Range   Hgb A1c MFr Bld 6.1 4.6 - 6.5 %    This visit occurred during the SARS-CoV-2 public health emergency.  Safety protocols were  in place, including screening questions prior to the visit, additional usage of staff PPE, and extensive cleaning of exam room while observing appropriate contact time as indicated for disinfecting solutions.   COVID 19 screen:  No recent travel or known exposure to COVID19 The patient denies respiratory symptoms of COVID 19 at this time. The importance of social distancing was discussed today.   Assessment and Plan    Problem List Items Addressed This Visit     Monoallelic mutation of CHEK2 gene in female patient - Primary    Normal CBE. Conitnue MR and mammogram annually.        Eliezer Lofts, MD

## 2021-07-05 DIAGNOSIS — Z20822 Contact with and (suspected) exposure to covid-19: Secondary | ICD-10-CM | POA: Diagnosis not present

## 2021-07-07 ENCOUNTER — Other Ambulatory Visit: Payer: Self-pay | Admitting: Family Medicine

## 2021-07-07 DIAGNOSIS — Z1239 Encounter for other screening for malignant neoplasm of breast: Secondary | ICD-10-CM

## 2021-07-10 DIAGNOSIS — S92352D Displaced fracture of fifth metatarsal bone, left foot, subsequent encounter for fracture with routine healing: Secondary | ICD-10-CM | POA: Diagnosis not present

## 2021-07-24 DIAGNOSIS — R262 Difficulty in walking, not elsewhere classified: Secondary | ICD-10-CM | POA: Diagnosis not present

## 2021-07-24 DIAGNOSIS — M79672 Pain in left foot: Secondary | ICD-10-CM | POA: Diagnosis not present

## 2021-07-24 DIAGNOSIS — M25572 Pain in left ankle and joints of left foot: Secondary | ICD-10-CM | POA: Diagnosis not present

## 2021-07-30 ENCOUNTER — Ambulatory Visit: Payer: Medicare Other | Admitting: Obstetrics & Gynecology

## 2021-07-30 ENCOUNTER — Ambulatory Visit: Payer: Medicare Other | Admitting: Obstetrics and Gynecology

## 2021-07-30 DIAGNOSIS — M79672 Pain in left foot: Secondary | ICD-10-CM | POA: Diagnosis not present

## 2021-07-30 DIAGNOSIS — R262 Difficulty in walking, not elsewhere classified: Secondary | ICD-10-CM | POA: Diagnosis not present

## 2021-07-30 DIAGNOSIS — M25572 Pain in left ankle and joints of left foot: Secondary | ICD-10-CM | POA: Diagnosis not present

## 2021-07-31 DIAGNOSIS — R262 Difficulty in walking, not elsewhere classified: Secondary | ICD-10-CM | POA: Diagnosis not present

## 2021-07-31 DIAGNOSIS — M25572 Pain in left ankle and joints of left foot: Secondary | ICD-10-CM | POA: Diagnosis not present

## 2021-07-31 DIAGNOSIS — M79672 Pain in left foot: Secondary | ICD-10-CM | POA: Diagnosis not present

## 2021-08-01 ENCOUNTER — Ambulatory Visit: Payer: Medicare Other | Admitting: Family Medicine

## 2021-08-01 ENCOUNTER — Other Ambulatory Visit: Payer: Self-pay | Admitting: Obstetrics & Gynecology

## 2021-08-01 NOTE — Telephone Encounter (Signed)
RX request Amitriptyline ?Last annual 07/25/2020 ?I will route to Provider for approval/denial ? ?

## 2021-08-05 ENCOUNTER — Encounter: Payer: Self-pay | Admitting: Family Medicine

## 2021-08-05 ENCOUNTER — Other Ambulatory Visit: Payer: Self-pay

## 2021-08-05 ENCOUNTER — Ambulatory Visit (INDEPENDENT_AMBULATORY_CARE_PROVIDER_SITE_OTHER): Payer: Medicare Other | Admitting: Family Medicine

## 2021-08-05 VITALS — Ht 64.25 in

## 2021-08-05 DIAGNOSIS — M25572 Pain in left ankle and joints of left foot: Secondary | ICD-10-CM | POA: Diagnosis not present

## 2021-08-05 DIAGNOSIS — R262 Difficulty in walking, not elsewhere classified: Secondary | ICD-10-CM | POA: Diagnosis not present

## 2021-08-05 DIAGNOSIS — R0989 Other specified symptoms and signs involving the circulatory and respiratory systems: Secondary | ICD-10-CM

## 2021-08-05 DIAGNOSIS — M79672 Pain in left foot: Secondary | ICD-10-CM | POA: Diagnosis not present

## 2021-08-07 ENCOUNTER — Ambulatory Visit: Payer: Medicare Other | Admitting: Family Medicine

## 2021-08-08 ENCOUNTER — Ambulatory Visit (INDEPENDENT_AMBULATORY_CARE_PROVIDER_SITE_OTHER): Payer: Medicare Other | Admitting: Family Medicine

## 2021-08-08 ENCOUNTER — Encounter: Payer: Self-pay | Admitting: Family Medicine

## 2021-08-08 VITALS — BP 122/80 | HR 78 | Temp 98.1°F | Ht 64.25 in | Wt 154.1 lb

## 2021-08-08 DIAGNOSIS — R262 Difficulty in walking, not elsewhere classified: Secondary | ICD-10-CM | POA: Diagnosis not present

## 2021-08-08 DIAGNOSIS — B353 Tinea pedis: Secondary | ICD-10-CM | POA: Insufficient documentation

## 2021-08-08 DIAGNOSIS — F411 Generalized anxiety disorder: Secondary | ICD-10-CM | POA: Diagnosis not present

## 2021-08-08 DIAGNOSIS — M25572 Pain in left ankle and joints of left foot: Secondary | ICD-10-CM | POA: Diagnosis not present

## 2021-08-08 DIAGNOSIS — R209 Unspecified disturbances of skin sensation: Secondary | ICD-10-CM | POA: Insufficient documentation

## 2021-08-08 DIAGNOSIS — M79672 Pain in left foot: Secondary | ICD-10-CM | POA: Diagnosis not present

## 2021-08-08 DIAGNOSIS — R0989 Other specified symptoms and signs involving the circulatory and respiratory systems: Secondary | ICD-10-CM | POA: Diagnosis not present

## 2021-08-08 MED ORDER — ITRACONAZOLE 100 MG PO CAPS: 200.0000 mg | ORAL_CAPSULE | Freq: Every day | ORAL | 0 refills | Status: DC

## 2021-08-08 MED ORDER — SERTRALINE HCL 25 MG PO TABS: 25.0000 mg | ORAL_TABLET | Freq: Every day | ORAL | 3 refills | Status: DC

## 2021-08-08 NOTE — Assessment & Plan Note (Signed)
Acute, poor control ? ?This has been going on greater than 2 months with an acute worsening in the last 3 weeks since her new foot issues.  We will start low-dose sertraline 25 mg daily at bedtime.  Reviewed medicine course side effects and plan.  She will return in 4 weeks for reevaluation of mood. ?

## 2021-08-08 NOTE — Assessment & Plan Note (Signed)
Acute, poorly controlled ?We will treat with oral itraconazole 200 mg twice daily for 1 week given she has failed over-the-counter topical clotrimazole x3 weeks. ? ?

## 2021-08-08 NOTE — Progress Notes (Addendum)
Patient ID: Rachael Sharp, female    DOB: 12/10/1950, 71 y.o.   MRN: [id number]  This visit was conducted in person.  BP 122/80   Pulse 78   Temp 98.1 F (36.7 C) (Oral)   Ht 5' 4.25" (1.632 m)   Wt 154 lb 1 oz (69.9 kg)   SpO2 97%   BMI 26.24 kg/m    CC: Chief Complaint  Patient presents with   Leg Redness    Left-Worse when standing for a long period of time   Rash    Bottom of left foot   Leg Pain    Upper left thigh    Subjective:   HPI: Rachael Sharp is a 71 y.o. female presenting on 08/08/2021 for Leg Redness (Left-Worse when standing for a long period of time), Rash (Bottom of left foot), and Leg Pain (Upper left thigh)  She experienced a closed displaced fracture of the fifth metatarsal bone of the left foot in December 2022.  She has been treated by orthopedic Dr. Okey Dupre.  Office visit from 04/28/2021 reviewed.  Wore a CAm walker.   She started PT 3 weeks ago.  PT noted left leg feels co when she is cooler than the other. Has noted redness in entire left leg x 1.5 weeks.  Some swelling in left dorsal foot, feels  Rash .. dry scaly on sole of left foot, feels itchy.  She has been using  athletes foot cream OTC ( lotrimin) for 11 days.. some improvement in rash. She has noted pain  in upper thigh.. mild soreness x 2 years. No calf pain with walking.  She is doing bike 8 miles a day... no pain in leg with this.     She has noted several month of issues with depression, anxiety and a feeling of vulnerability.  This has worseed since she noted her left leg issues.   She is is extremely worried about it.. causing her trouble sleeping.  Brother in law had amputation.   Father with PAD.Marland Kitchen was a smoker.     08/08/2021   10:14 AM 01/25/2021    9:24 AM 01/23/2020    9:59 AM  Depression screen PHQ 2/9  Decreased Interest 1 0 0  Down, Depressed, Hopeless 2 0 0  PHQ - 2 Score 3 0 0  Altered sleeping 3  0  Tired, decreased energy 1  0  Change in  appetite 2  0  Feeling bad or failure about yourself  0  0  Trouble concentrating 2  0  Moving slowly or fidgety/restless 0  0  Suicidal thoughts 0  0  PHQ-9 Score 11  0  Difficult doing work/chores Somewhat difficult  Not difficult at all       08/08/2021   10:15 AM  GAD 7 : Generalized Anxiety Score  Nervous, Anxious, on Edge 3  Control/stop worrying 3  Worry too much - different things 3  Trouble relaxing 2  Restless 1  Easily annoyed or irritable 1  Afraid - awful might happen 3  Total GAD 7 Score 16  Anxiety Difficulty Somewhat difficult     Relevant past medical, surgical, family and social history reviewed and updated as indicated. Interim medical history since our last visit reviewed. Allergies and medications reviewed and updated. Outpatient Medications Prior to Visit  Medication Sig Dispense Refill   amitriptyline (ELAVIL) 10 MG tablet Take 3 tablets (30 mg total) by mouth at bedtime. 270 tablet 3   aspirin  81 MG EC tablet Take 1 tablet by mouth daily.     BIOTIN PO Take 5,000 mcg by mouth daily.     CALCIUM PO Take 1 tablet by mouth in the morning and at bedtime.     Glucosamine HCl-MSM (GLUCOSAMINE-MSM PO) Take 1 tablet by mouth daily.     Multiple Vitamin (MULTIVITAMIN) tablet Take 2 tablets by mouth daily.      Omega-3 Fatty Acids (OMEGA 3 PO) Take 600 mg by mouth daily.      tretinoin (RETIN-A) 0.025 % cream Apply topically at bedtime.     No facility-administered medications prior to visit.     Per HPI unless specifically indicated in ROS section below Review of Systems  Constitutional:  Negative for fatigue and fever.  HENT:  Negative for congestion.   Eyes:  Negative for pain.  Respiratory:  Negative for cough and shortness of breath.   Cardiovascular:  Negative for chest pain, palpitations and leg swelling.  Gastrointestinal:  Negative for abdominal pain.  Genitourinary:  Negative for dysuria and vaginal bleeding.  Musculoskeletal:  Negative for  back pain.  Neurological:  Negative for syncope, light-headedness and headaches.  Psychiatric/Behavioral:  Negative for dysphoric mood.   Objective:  BP 122/80   Pulse 78   Temp 98.1 F (36.7 C) (Oral)   Ht 5' 4.25" (1.632 m)   Wt 154 lb 1 oz (69.9 kg)   SpO2 97%   BMI 26.24 kg/m   Wt Readings from Last 3 Encounters:  08/08/21 154 lb 1 oz (69.9 kg)  07/04/21 158 lb 9 oz (71.9 kg)  01/28/21 146 lb (66.2 kg)      Physical Exam Constitutional:      General: She is not in acute distress.    Appearance: Normal appearance. She is well-developed. She is not ill-appearing or toxic-appearing.  HENT:     Head: Normocephalic.     Right Ear: Hearing, tympanic membrane, ear canal and external ear normal. Tympanic membrane is not erythematous, retracted or bulging.     Left Ear: Hearing, tympanic membrane, ear canal and external ear normal. Tympanic membrane is not erythematous, retracted or bulging.     Nose: No mucosal edema or rhinorrhea.     Right Sinus: No maxillary sinus tenderness or frontal sinus tenderness.     Left Sinus: No maxillary sinus tenderness or frontal sinus tenderness.     Mouth/Throat:     Pharynx: Uvula midline.  Eyes:     General: Lids are normal. Lids are everted, no foreign bodies appreciated.     Conjunctiva/sclera: Conjunctivae normal.     Pupils: Pupils are equal, round, and reactive to light.  Neck:     Thyroid: No thyroid mass or thyromegaly.     Vascular: No carotid bruit.     Trachea: Trachea normal.  Cardiovascular:     Rate and Rhythm: Normal rate and regular rhythm.     Pulses:          Dorsalis pedis pulses are 2+ on the right side and 1+ on the left side.       Posterior tibial pulses are 2+ on the right side and 1+ on the left side.     Heart sounds: Normal heart sounds, S1 normal and S2 normal. No murmur heard.   No friction rub. No gallop.     Comments: Mild dorsal swelling of left foot Pulmonary:     Effort: Pulmonary effort is normal. No  tachypnea or respiratory distress.  Breath sounds: Normal breath sounds. No decreased breath sounds, wheezing, rhonchi or rales.  Abdominal:     General: Bowel sounds are normal.     Palpations: Abdomen is soft.     Tenderness: There is no abdominal tenderness.  Musculoskeletal:     Cervical back: Normal range of motion and neck supple.     Right lower leg: No edema.     Left lower leg: No edema.  Skin:    General: Skin is warm and dry.     Findings: No rash.     Comments: Dry flaky rash on sole of left foot, erythematous leading edge  Neurological:     Mental Status: She is alert.  Psychiatric:        Mood and Affect: Mood is not anxious or depressed.        Speech: Speech normal.        Behavior: Behavior normal. Behavior is cooperative.        Thought Content: Thought content normal.        Judgment: Judgment normal.      Results for orders placed or performed in visit on 01/22/21  Comprehensive metabolic panel  Result Value Ref Range   Sodium 136 135 - 145 mEq/L   Potassium 4.6 3.5 - 5.1 mEq/L   Chloride 100 96 - 112 mEq/L   CO2 28 19 - 32 mEq/L   Glucose, Bld 112 (H) 70 - 99 mg/dL   BUN 21 6 - 23 mg/dL   Creatinine, Ser 1.61 0.40 - 1.20 mg/dL   Total Bilirubin 0.4 0.2 - 1.2 mg/dL   Alkaline Phosphatase 49 39 - 117 U/L   AST 25 0 - 37 U/L   ALT 27 0 - 35 U/L   Total Protein 7.2 6.0 - 8.3 g/dL   Albumin 4.6 3.5 - 5.2 g/dL   GFR 09.60 (L) >45.40 mL/min   Calcium 10.0 8.4 - 10.5 mg/dL  Lipid panel  Result Value Ref Range   Cholesterol 228 (H) 0 - 200 mg/dL   Triglycerides 98.1 0.0 - 149.0 mg/dL   HDL 19.14 >78.29 mg/dL   VLDL 56.2 0.0 - 13.0 mg/dL   LDL Cholesterol 865 (H) 0 - 99 mg/dL   Total CHOL/HDL Ratio 3    NonHDL 140.21   Hemoglobin A1c  Result Value Ref Range   Hgb A1c MFr Bld 6.1 4.6 - 6.5 %    This visit occurred during the SARS-CoV-2 public health emergency.  Safety protocols were in place, including screening questions prior to the visit,  additional usage of staff PPE, and extensive cleaning of exam room while observing appropriate contact time as indicated for disinfecting solutions.   COVID 19 screen:  No recent travel or known exposure to COVID19 The patient denies respiratory symptoms of COVID 19 at this time. The importance of social distancing was discussed today.   Assessment and Plan Problem List Items Addressed This Visit     Cold extremities - Primary   Relevant Orders   VAS [place] LOWER EXT ART SEG MULTI (SEGMENTALS & LE RAYNAUDS)   Decreased dorsalis pedis pulse    Acute, She has noted skin changes in left leg as well as cold temperature of left foot in the last 3 weeks.  Pulse on left is slightly decreased.  There is a family have to history of peripheral artery disease. We will move forward with arterial out evaluation.  She will continue working on low-cholesterol diet.  Of note some of her  symptoms may be related to recent use of Cam walker and metatarsal fracture.  Recurrence of swelling and soreness have lightly recurred since she started physical therapy in the last 3 weeks.      Relevant Orders   VAS [place] LOWER EXT ART SEG MULTI (SEGMENTALS & LE RAYNAUDS)   GAD (generalized anxiety disorder)    Acute, poor control  This has been going on greater than 2 months with an acute worsening in the last 3 weeks since her new foot issues.  We will start low-dose sertraline 25 mg daily at bedtime.  Reviewed medicine course side effects and plan.  She will return in 4 weeks for reevaluation of mood.      Relevant Medications   sertraline (ZOLOFT) 25 MG tablet   Tinea pedis of left foot    Acute, poorly controlled We will treat with oral itraconazole 200 mg twice daily for 1 week given she has failed over-the-counter topical clotrimazole x3 weeks.       Relevant Medications   itraconazole (SPORANOX) 100 MG capsule    Meds ordered this encounter  Medications   sertraline (ZOLOFT) 25 MG tablet    Sig: Take 1  tablet (25 mg total) by mouth daily.    Dispense:  30 tablet    Refill:  3   itraconazole (SPORANOX) 100 MG capsule    Sig: Take 2 capsules (200 mg total) by mouth daily.    Dispense:  21 capsule    Refill:  0        Kerby Nora, MD

## 2021-08-08 NOTE — Progress Notes (Signed)
MD left  emergently ?

## 2021-08-08 NOTE — Assessment & Plan Note (Signed)
Acute, ?She has noted skin changes in left leg as well as cold temperature of left foot in the last 3 weeks.  Pulse on left is slightly decreased.  There is a family have to history of peripheral artery disease. ?We will move forward with arterial out evaluation.  She will continue working on low-cholesterol diet. ? ?Of note some of her symptoms may be related to recent use of Cam walker and metatarsal fracture.  Recurrence of swelling and soreness have lightly recurred since she started physical therapy in the last 3 weeks. ?

## 2021-08-08 NOTE — Patient Instructions (Addendum)
Start sertraline 25 mg at bedtime. ? We will work on setting up blood flow test. ? Start antifungal medication  x 1 week. ?Spray  shoes with antifungal spray. ? ?

## 2021-08-11 ENCOUNTER — Telehealth: Payer: Self-pay | Admitting: *Deleted

## 2021-08-11 NOTE — Telephone Encounter (Signed)
That was the only one that looked like it was covered when I was prescribing... did they given you a list of what they covered, orally given she has failed topical? Fluconazole? ? If nothing let the patient know to continue the OTC clotrimazole  ( lotromin) BID.Rachael Sharp will take a long time to resolve  ?

## 2021-08-11 NOTE — Telephone Encounter (Signed)
Received request from [facility] for PA on Sporanox.  PA completed on CoverMyMeds and denied.  The Dx of tinea pedis in considered an experimental or investigational indication for this drug.  Denial letter placed in Dr. Devra Dopp office in box for review.  ?

## 2021-08-11 NOTE — Telephone Encounter (Signed)
No alternatives were provided on the denial letter. Ebony Hail notified as instructed by telephone.  I did tell patient about looking on GoodRx to see if she could find a coupon and pay out of pocket for the medication.  Patient states understanding.  ?

## 2021-08-12 ENCOUNTER — Ambulatory Visit (INDEPENDENT_AMBULATORY_CARE_PROVIDER_SITE_OTHER): Payer: Medicare Other

## 2021-08-12 DIAGNOSIS — M25572 Pain in left ankle and joints of left foot: Secondary | ICD-10-CM | POA: Diagnosis not present

## 2021-08-12 DIAGNOSIS — R209 Unspecified disturbances of skin sensation: Secondary | ICD-10-CM | POA: Diagnosis not present

## 2021-08-12 DIAGNOSIS — M79672 Pain in left foot: Secondary | ICD-10-CM | POA: Diagnosis not present

## 2021-08-12 DIAGNOSIS — R0989 Other specified symptoms and signs involving the circulatory and respiratory systems: Secondary | ICD-10-CM

## 2021-08-12 DIAGNOSIS — R262 Difficulty in walking, not elsewhere classified: Secondary | ICD-10-CM | POA: Diagnosis not present

## 2021-08-13 ENCOUNTER — Encounter: Payer: Self-pay | Admitting: Family Medicine

## 2021-08-15 DIAGNOSIS — R262 Difficulty in walking, not elsewhere classified: Secondary | ICD-10-CM | POA: Diagnosis not present

## 2021-08-15 DIAGNOSIS — M79672 Pain in left foot: Secondary | ICD-10-CM | POA: Diagnosis not present

## 2021-08-15 DIAGNOSIS — M25572 Pain in left ankle and joints of left foot: Secondary | ICD-10-CM | POA: Diagnosis not present

## 2021-08-19 ENCOUNTER — Other Ambulatory Visit: Payer: Self-pay | Admitting: Family Medicine

## 2021-08-19 ENCOUNTER — Encounter: Payer: Self-pay | Admitting: Family Medicine

## 2021-08-19 DIAGNOSIS — R262 Difficulty in walking, not elsewhere classified: Secondary | ICD-10-CM | POA: Diagnosis not present

## 2021-08-19 DIAGNOSIS — M25572 Pain in left ankle and joints of left foot: Secondary | ICD-10-CM | POA: Diagnosis not present

## 2021-08-19 DIAGNOSIS — M79672 Pain in left foot: Secondary | ICD-10-CM | POA: Diagnosis not present

## 2021-08-19 MED ORDER — FLUCONAZOLE 150 MG PO TABS: ORAL_TABLET | ORAL | 0 refills | Status: DC

## 2021-08-22 DIAGNOSIS — M25572 Pain in left ankle and joints of left foot: Secondary | ICD-10-CM | POA: Diagnosis not present

## 2021-08-22 DIAGNOSIS — M79672 Pain in left foot: Secondary | ICD-10-CM | POA: Diagnosis not present

## 2021-08-22 DIAGNOSIS — R262 Difficulty in walking, not elsewhere classified: Secondary | ICD-10-CM | POA: Diagnosis not present

## 2021-08-27 DIAGNOSIS — M25572 Pain in left ankle and joints of left foot: Secondary | ICD-10-CM | POA: Diagnosis not present

## 2021-08-27 DIAGNOSIS — M79672 Pain in left foot: Secondary | ICD-10-CM | POA: Diagnosis not present

## 2021-08-27 DIAGNOSIS — R262 Difficulty in walking, not elsewhere classified: Secondary | ICD-10-CM | POA: Diagnosis not present

## 2021-08-29 DIAGNOSIS — M79672 Pain in left foot: Secondary | ICD-10-CM | POA: Diagnosis not present

## 2021-08-29 DIAGNOSIS — R262 Difficulty in walking, not elsewhere classified: Secondary | ICD-10-CM | POA: Diagnosis not present

## 2021-08-29 DIAGNOSIS — M25572 Pain in left ankle and joints of left foot: Secondary | ICD-10-CM | POA: Diagnosis not present

## 2021-08-30 ENCOUNTER — Other Ambulatory Visit: Payer: Self-pay | Admitting: Family Medicine

## 2021-09-01 DIAGNOSIS — Z20822 Contact with and (suspected) exposure to covid-19: Secondary | ICD-10-CM | POA: Diagnosis not present

## 2021-09-02 DIAGNOSIS — R262 Difficulty in walking, not elsewhere classified: Secondary | ICD-10-CM | POA: Diagnosis not present

## 2021-09-02 DIAGNOSIS — M25572 Pain in left ankle and joints of left foot: Secondary | ICD-10-CM | POA: Diagnosis not present

## 2021-09-02 DIAGNOSIS — M79672 Pain in left foot: Secondary | ICD-10-CM | POA: Diagnosis not present

## 2021-09-05 ENCOUNTER — Encounter: Payer: Self-pay | Admitting: Family Medicine

## 2021-09-05 ENCOUNTER — Ambulatory Visit (INDEPENDENT_AMBULATORY_CARE_PROVIDER_SITE_OTHER): Payer: Medicare Other | Admitting: Family Medicine

## 2021-09-05 VITALS — BP 112/78 | HR 80 | Temp 97.8°F | Ht 64.25 in | Wt 152.1 lb

## 2021-09-05 DIAGNOSIS — F411 Generalized anxiety disorder: Secondary | ICD-10-CM

## 2021-09-05 DIAGNOSIS — B353 Tinea pedis: Secondary | ICD-10-CM

## 2021-09-05 DIAGNOSIS — R262 Difficulty in walking, not elsewhere classified: Secondary | ICD-10-CM | POA: Diagnosis not present

## 2021-09-05 DIAGNOSIS — M25572 Pain in left ankle and joints of left foot: Secondary | ICD-10-CM | POA: Diagnosis not present

## 2021-09-05 DIAGNOSIS — M79672 Pain in left foot: Secondary | ICD-10-CM | POA: Diagnosis not present

## 2021-09-05 NOTE — Progress Notes (Signed)
Patient ID: Rachael Sharp, female    DOB: 09/13/1950, 71 y.o.   MRN: [id number]  This visit was conducted in person.  BP 112/78   Pulse 80   Temp 97.8 F (36.6 C) (Oral)   Ht 5' 4.25" (1.632 m)   Wt 152 lb 1 oz (69 kg)   SpO2 95%   BMI 25.90 kg/m    CC:  Chief Complaint  Patient presents with   Follow-up    Mood, sore spot on left upper leg and foot fungus    Subjective:   HPI: Rachael Sharp is a 71 y.o. female presenting on 09/05/2021 for Follow-up (Mood, sore spot on left upper leg and foot fungus)  GAD:  She has noted improvement in  mood overall. No SE.  PHQ9: 5  GAD7: 5  Excessive worry improved.  She feels 75% improvement in mood.   She has noted some improvement in foot fungus.    She is on 3rd dose of oral antifungal. No SE.Marland Kitchen 50% improvement in rash.  No longer itchy.    Relevant past medical, surgical, family and social history reviewed and updated as indicated. Interim medical history since our last visit reviewed. Allergies and medications reviewed and updated. Outpatient Medications Prior to Visit  Medication Sig Dispense Refill   amitriptyline (ELAVIL) 10 MG tablet Take 3 tablets (30 mg total) by mouth at bedtime. 270 tablet 3   aspirin 81 MG EC tablet Take 1 tablet by mouth daily.     BIOTIN PO Take 5,000 mcg by mouth daily.     CALCIUM PO Take 1 tablet by mouth in the morning and at bedtime.     fluconazole (DIFLUCAN) 150 MG tablet 150 mg po once weekly for 2 to 6 weeks 6 tablet 0   Glucosamine HCl-MSM (GLUCOSAMINE-MSM PO) Take 1 tablet by mouth daily.     Multiple Vitamin (MULTIVITAMIN) tablet Take 2 tablets by mouth daily.      Omega-3 Fatty Acids (OMEGA 3 PO) Take 600 mg by mouth daily.      sertraline (ZOLOFT) 25 MG tablet TAKE 1 TABLET (25 MG TOTAL) BY MOUTH DAILY. 90 tablet 0   tretinoin (RETIN-A) 0.025 % cream Apply topically at bedtime.     itraconazole (SPORANOX) 100 MG capsule Take 2 capsules (200 mg total) by mouth daily.  21 capsule 0   No facility-administered medications prior to visit.     Per HPI unless specifically indicated in ROS section below Review of Systems  Constitutional:  Negative for fatigue and fever.  HENT:  Negative for congestion.   Eyes:  Negative for pain.  Respiratory:  Negative for cough and shortness of breath.   Cardiovascular:  Negative for chest pain, palpitations and leg swelling.  Gastrointestinal:  Negative for abdominal pain.  Genitourinary:  Negative for dysuria and vaginal bleeding.  Musculoskeletal:  Negative for back pain.  Neurological:  Negative for syncope, light-headedness and headaches.  Psychiatric/Behavioral:  Negative for dysphoric mood.    Objective:  BP 112/78   Pulse 80   Temp 97.8 F (36.6 C) (Oral)   Ht 5' 4.25" (1.632 m)   Wt 152 lb 1 oz (69 kg)   SpO2 95%   BMI 25.90 kg/m   Wt Readings from Last 3 Encounters:  09/05/21 152 lb 1 oz (69 kg)  08/08/21 154 lb 1 oz (69.9 kg)  07/04/21 158 lb 9 oz (71.9 kg)      Physical Exam Constitutional:  General: She is not in acute distress.    Appearance: Normal appearance. She is well-developed. She is not ill-appearing or toxic-appearing.  HENT:     Head: Normocephalic.     Right Ear: Hearing, tympanic membrane, ear canal and external ear normal. Tympanic membrane is not erythematous, retracted or bulging.     Left Ear: Hearing, tympanic membrane, ear canal and external ear normal. Tympanic membrane is not erythematous, retracted or bulging.     Nose: No mucosal edema or rhinorrhea.     Right Sinus: No maxillary sinus tenderness or frontal sinus tenderness.     Left Sinus: No maxillary sinus tenderness or frontal sinus tenderness.     Mouth/Throat:     Pharynx: Uvula midline.  Eyes:     General: Lids are normal. Lids are everted, no foreign bodies appreciated.     Conjunctiva/sclera: Conjunctivae normal.     Pupils: Pupils are equal, round, and reactive to light.  Neck:     Thyroid: No thyroid  mass or thyromegaly.     Vascular: No carotid bruit.     Trachea: Trachea normal.  Cardiovascular:     Rate and Rhythm: Normal rate and regular rhythm.     Pulses:          Dorsalis pedis pulses are 2+ on the right side and 1+ on the left side.       Posterior tibial pulses are 2+ on the right side and 1+ on the left side.     Heart sounds: Normal heart sounds, S1 normal and S2 normal. No murmur heard.    No friction rub. No gallop.     Comments: Mild dorsal swelling of left foot Pulmonary:     Effort: Pulmonary effort is normal. No tachypnea or respiratory distress.     Breath sounds: Normal breath sounds. No decreased breath sounds, wheezing, rhonchi or rales.  Abdominal:     General: Bowel sounds are normal.     Palpations: Abdomen is soft.     Tenderness: There is no abdominal tenderness.  Musculoskeletal:     Cervical back: Normal range of motion and neck supple.     Right lower leg: No edema.     Left lower leg: No edema.  Skin:    General: Skin is warm and dry.     Findings: No rash.     Comments: Dry flaky rash on sole of left foot, erythematous leading edge  Neurological:     Mental Status: She is alert.  Psychiatric:        Mood and Affect: Mood is not anxious or depressed.        Speech: Speech normal.        Behavior: Behavior normal. Behavior is cooperative.        Thought Content: Thought content normal.        Judgment: Judgment normal.       Results for orders placed or performed in visit on 01/22/21  Comprehensive metabolic panel  Result Value Ref Range   Sodium 136 135 - 145 mEq/L   Potassium 4.6 3.5 - 5.1 mEq/L   Chloride 100 96 - 112 mEq/L   CO2 28 19 - 32 mEq/L   Glucose, Bld 112 (H) 70 - 99 mg/dL   BUN 21 6 - 23 mg/dL   Creatinine, Ser 1.07 0.40 - 1.20 mg/dL   Total Bilirubin 0.4 0.2 - 1.2 mg/dL   Alkaline Phosphatase 49 39 - 117 U/L   AST 25 0 -  37 U/L   ALT 27 0 - 35 U/L   Total Protein 7.2 6.0 - 8.3 g/dL   Albumin 4.6 3.5 - 5.2 g/dL   GFR  52.82 (L) >60.00 mL/min   Calcium 10.0 8.4 - 10.5 mg/dL  Lipid panel  Result Value Ref Range   Cholesterol 228 (H) 0 - 200 mg/dL   Triglycerides 71.0 0.0 - 149.0 mg/dL   HDL 87.50 >39.00 mg/dL   VLDL 14.2 0.0 - 40.0 mg/dL   LDL Cholesterol 126 (H) 0 - 99 mg/dL   Total CHOL/HDL Ratio 3    NonHDL 140.21   Hemoglobin A1c  Result Value Ref Range   Hgb A1c MFr Bld 6.1 4.6 - 6.5 %    This visit occurred during the SARS-CoV-2 public health emergency.  Safety protocols were in place, including screening questions prior to the visit, additional usage of staff PPE, and extensive cleaning of exam room while observing appropriate contact time as indicated for disinfecting solutions.   COVID 19 screen:  No recent travel or known exposure to COVID19 The patient denies respiratory symptoms of COVID 19 at this time. The importance of social distancing was discussed today.   Assessment and Plan     Eliezer Lofts, MD

## 2021-09-05 NOTE — Patient Instructions (Signed)
Continue sertraline at 25 mg daily ? Complete antifungal course. ? Consider evaluation with Dr. Lorelei Pont for  chronic left groin pain. ?

## 2021-09-08 DIAGNOSIS — R262 Difficulty in walking, not elsewhere classified: Secondary | ICD-10-CM | POA: Diagnosis not present

## 2021-09-08 DIAGNOSIS — M25572 Pain in left ankle and joints of left foot: Secondary | ICD-10-CM | POA: Diagnosis not present

## 2021-09-08 DIAGNOSIS — M79672 Pain in left foot: Secondary | ICD-10-CM | POA: Diagnosis not present

## 2021-09-11 DIAGNOSIS — M79672 Pain in left foot: Secondary | ICD-10-CM | POA: Diagnosis not present

## 2021-09-11 DIAGNOSIS — M25572 Pain in left ankle and joints of left foot: Secondary | ICD-10-CM | POA: Diagnosis not present

## 2021-09-11 DIAGNOSIS — R262 Difficulty in walking, not elsewhere classified: Secondary | ICD-10-CM | POA: Diagnosis not present

## 2021-09-15 DIAGNOSIS — M7742 Metatarsalgia, left foot: Secondary | ICD-10-CM | POA: Diagnosis not present

## 2021-09-18 DIAGNOSIS — M7742 Metatarsalgia, left foot: Secondary | ICD-10-CM | POA: Diagnosis not present

## 2021-09-18 DIAGNOSIS — M25572 Pain in left ankle and joints of left foot: Secondary | ICD-10-CM | POA: Diagnosis not present

## 2021-09-18 DIAGNOSIS — R262 Difficulty in walking, not elsewhere classified: Secondary | ICD-10-CM | POA: Diagnosis not present

## 2021-09-18 DIAGNOSIS — M79672 Pain in left foot: Secondary | ICD-10-CM | POA: Diagnosis not present

## 2021-09-18 DIAGNOSIS — Z20822 Contact with and (suspected) exposure to covid-19: Secondary | ICD-10-CM | POA: Diagnosis not present

## 2021-09-22 DIAGNOSIS — M7742 Metatarsalgia, left foot: Secondary | ICD-10-CM | POA: Diagnosis not present

## 2021-09-23 ENCOUNTER — Other Ambulatory Visit: Payer: Self-pay | Admitting: Family Medicine

## 2021-09-23 NOTE — Telephone Encounter (Signed)
Last office visit 09/05/2021 for follow up mood, spot on left upper leg and foot fungus.  Last refilled 08/19/2021 for #6 with no refills.  No future appointments with PCP.  ?

## 2021-09-25 DIAGNOSIS — M25572 Pain in left ankle and joints of left foot: Secondary | ICD-10-CM | POA: Diagnosis not present

## 2021-09-25 DIAGNOSIS — M7742 Metatarsalgia, left foot: Secondary | ICD-10-CM | POA: Diagnosis not present

## 2021-09-25 DIAGNOSIS — M79672 Pain in left foot: Secondary | ICD-10-CM | POA: Diagnosis not present

## 2021-09-25 DIAGNOSIS — R262 Difficulty in walking, not elsewhere classified: Secondary | ICD-10-CM | POA: Diagnosis not present

## 2021-10-06 ENCOUNTER — Encounter: Payer: Self-pay | Admitting: Family Medicine

## 2021-10-06 NOTE — Telephone Encounter (Signed)
If okay, please sign order below.

## 2021-10-07 ENCOUNTER — Ambulatory Visit
Admission: RE | Admit: 2021-10-07 | Discharge: 2021-10-07 | Disposition: A | Discharge status: Home / Self Care | Payer: Medicare Other | Source: Ambulatory Visit | Attending: Family Medicine | Admitting: Family Medicine

## 2021-10-07 DIAGNOSIS — M7742 Metatarsalgia, left foot: Secondary | ICD-10-CM | POA: Diagnosis not present

## 2021-10-07 DIAGNOSIS — Z1231 Encounter for screening mammogram for malignant neoplasm of breast: Secondary | ICD-10-CM | POA: Diagnosis not present

## 2021-10-07 MED ORDER — AMITRIPTYLINE HCL 10 MG PO TABS: 30.0000 mg | ORAL_TABLET | Freq: Every day | ORAL | 3 refills | Status: DC

## 2021-10-08 DIAGNOSIS — M79672 Pain in left foot: Secondary | ICD-10-CM | POA: Diagnosis not present

## 2021-10-08 DIAGNOSIS — M25572 Pain in left ankle and joints of left foot: Secondary | ICD-10-CM | POA: Diagnosis not present

## 2021-10-08 DIAGNOSIS — R262 Difficulty in walking, not elsewhere classified: Secondary | ICD-10-CM | POA: Diagnosis not present

## 2021-10-09 DIAGNOSIS — M7742 Metatarsalgia, left foot: Secondary | ICD-10-CM | POA: Diagnosis not present

## 2021-10-13 DIAGNOSIS — M7742 Metatarsalgia, left foot: Secondary | ICD-10-CM | POA: Diagnosis not present

## 2021-10-15 DIAGNOSIS — M25572 Pain in left ankle and joints of left foot: Secondary | ICD-10-CM | POA: Diagnosis not present

## 2021-10-15 DIAGNOSIS — R262 Difficulty in walking, not elsewhere classified: Secondary | ICD-10-CM | POA: Diagnosis not present

## 2021-10-15 DIAGNOSIS — M79672 Pain in left foot: Secondary | ICD-10-CM | POA: Diagnosis not present

## 2021-10-17 DIAGNOSIS — B353 Tinea pedis: Secondary | ICD-10-CM | POA: Diagnosis not present

## 2021-10-18 NOTE — Assessment & Plan Note (Signed)
Chronic, improved Continue sertraline 25 mg p.o. daily.

## 2021-10-18 NOTE — Assessment & Plan Note (Signed)
Acute, Significant improvement with antifungal.

## 2021-10-21 DIAGNOSIS — M7742 Metatarsalgia, left foot: Secondary | ICD-10-CM | POA: Diagnosis not present

## 2021-11-04 DIAGNOSIS — M7742 Metatarsalgia, left foot: Secondary | ICD-10-CM | POA: Diagnosis not present

## 2021-11-12 DIAGNOSIS — M7742 Metatarsalgia, left foot: Secondary | ICD-10-CM | POA: Diagnosis not present

## 2021-11-13 ENCOUNTER — Ambulatory Visit (AMBULATORY_SURGERY_CENTER): Payer: Self-pay | Admitting: *Deleted

## 2021-11-13 VITALS — Ht 64.0 in | Wt 154.0 lb

## 2021-11-13 DIAGNOSIS — Z1509 Genetic susceptibility to other malignant neoplasm: Secondary | ICD-10-CM

## 2021-11-13 DIAGNOSIS — Z1502 Genetic susceptibility to malignant neoplasm of ovary: Secondary | ICD-10-CM

## 2021-11-13 DIAGNOSIS — Z1589 Genetic susceptibility to other disease: Secondary | ICD-10-CM

## 2021-11-13 DIAGNOSIS — Z1501 Genetic susceptibility to malignant neoplasm of breast: Secondary | ICD-10-CM

## 2021-11-13 MED ORDER — NA SULFATE-K SULFATE-MG SULF 17.5-3.13-1.6 GM/177ML PO SOLN: 1.0000 | ORAL | 0 refills | Status: DC

## 2021-11-13 NOTE — Progress Notes (Signed)
Patient is here in-person for PV. Patient denies any allergies to eggs or soy. Patient denies any problems with anesthesia/sedation. Patient is not on any oxygen at home. Patient is not taking any diet/weight loss medications or blood thinners. Went over procedure prep instructions with the patient. Patient is aware of our care-partner policy. Patient notified to use Single[employer] card given to pt for prescription.   EMMI education assigned to the patient for the procedure, sent to [facility].

## 2021-11-19 DIAGNOSIS — M7742 Metatarsalgia, left foot: Secondary | ICD-10-CM | POA: Diagnosis not present

## 2021-11-22 ENCOUNTER — Other Ambulatory Visit: Payer: Self-pay | Admitting: Family Medicine

## 2021-11-24 ENCOUNTER — Telehealth: Payer: Self-pay | Admitting: Family Medicine

## 2021-11-24 DIAGNOSIS — Z1501 Genetic susceptibility to malignant neoplasm of breast: Secondary | ICD-10-CM

## 2021-11-24 DIAGNOSIS — Z1239 Encounter for other screening for malignant neoplasm of breast: Secondary | ICD-10-CM

## 2021-11-24 NOTE — Telephone Encounter (Signed)
Pt called in stated she need a referral for her yearly [place] MRI done . Would like it to be sent to [facility] radiology . Please Advise [phone]

## 2021-11-25 DIAGNOSIS — H35372 Puckering of macula, left eye: Secondary | ICD-10-CM | POA: Diagnosis not present

## 2021-11-25 DIAGNOSIS — H43813 Vitreous degeneration, bilateral: Secondary | ICD-10-CM | POA: Diagnosis not present

## 2021-11-25 DIAGNOSIS — H2513 Age-related nuclear cataract, bilateral: Secondary | ICD-10-CM | POA: Diagnosis not present

## 2021-11-25 DIAGNOSIS — H18513 Endothelial corneal dystrophy, bilateral: Secondary | ICD-10-CM | POA: Diagnosis not present

## 2021-11-26 DIAGNOSIS — Z1239 Encounter for other screening for malignant neoplasm of breast: Secondary | ICD-10-CM | POA: Insufficient documentation

## 2021-11-26 NOTE — Telephone Encounter (Signed)
Laurie notified by telephone that MRI order has been placed as requested.

## 2021-11-26 NOTE — Telephone Encounter (Signed)
Order for MRI breast sent to [facility]. Please let patient know.

## 2021-11-27 DIAGNOSIS — D2262 Melanocytic nevi of left upper limb, including shoulder: Secondary | ICD-10-CM | POA: Diagnosis not present

## 2021-11-27 DIAGNOSIS — D2272 Melanocytic nevi of left lower limb, including hip: Secondary | ICD-10-CM | POA: Diagnosis not present

## 2021-11-27 DIAGNOSIS — M9905 Segmental and somatic dysfunction of pelvic region: Secondary | ICD-10-CM | POA: Diagnosis not present

## 2021-11-27 DIAGNOSIS — M955 Acquired deformity of pelvis: Secondary | ICD-10-CM | POA: Diagnosis not present

## 2021-11-27 DIAGNOSIS — M9903 Segmental and somatic dysfunction of lumbar region: Secondary | ICD-10-CM | POA: Diagnosis not present

## 2021-11-27 DIAGNOSIS — D2261 Melanocytic nevi of right upper limb, including shoulder: Secondary | ICD-10-CM | POA: Diagnosis not present

## 2021-11-27 DIAGNOSIS — D2271 Melanocytic nevi of right lower limb, including hip: Secondary | ICD-10-CM | POA: Diagnosis not present

## 2021-11-27 DIAGNOSIS — L089 Local infection of the skin and subcutaneous tissue, unspecified: Secondary | ICD-10-CM | POA: Diagnosis not present

## 2021-11-27 DIAGNOSIS — D225 Melanocytic nevi of trunk: Secondary | ICD-10-CM | POA: Diagnosis not present

## 2021-11-27 DIAGNOSIS — M5136 Other intervertebral disc degeneration, lumbar region: Secondary | ICD-10-CM | POA: Diagnosis not present

## 2021-12-02 DIAGNOSIS — M5136 Other intervertebral disc degeneration, lumbar region: Secondary | ICD-10-CM | POA: Diagnosis not present

## 2021-12-02 DIAGNOSIS — M955 Acquired deformity of pelvis: Secondary | ICD-10-CM | POA: Diagnosis not present

## 2021-12-02 DIAGNOSIS — M9905 Segmental and somatic dysfunction of pelvic region: Secondary | ICD-10-CM | POA: Diagnosis not present

## 2021-12-02 DIAGNOSIS — M9903 Segmental and somatic dysfunction of lumbar region: Secondary | ICD-10-CM | POA: Diagnosis not present

## 2021-12-05 DIAGNOSIS — M9903 Segmental and somatic dysfunction of lumbar region: Secondary | ICD-10-CM | POA: Diagnosis not present

## 2021-12-05 DIAGNOSIS — M955 Acquired deformity of pelvis: Secondary | ICD-10-CM | POA: Diagnosis not present

## 2021-12-05 DIAGNOSIS — M9905 Segmental and somatic dysfunction of pelvic region: Secondary | ICD-10-CM | POA: Diagnosis not present

## 2021-12-05 DIAGNOSIS — M5136 Other intervertebral disc degeneration, lumbar region: Secondary | ICD-10-CM | POA: Diagnosis not present

## 2021-12-08 ENCOUNTER — Telehealth: Payer: Self-pay | Admitting: Internal Medicine

## 2021-12-08 DIAGNOSIS — M5136 Other intervertebral disc degeneration, lumbar region: Secondary | ICD-10-CM | POA: Diagnosis not present

## 2021-12-08 DIAGNOSIS — M955 Acquired deformity of pelvis: Secondary | ICD-10-CM | POA: Diagnosis not present

## 2021-12-08 DIAGNOSIS — M9905 Segmental and somatic dysfunction of pelvic region: Secondary | ICD-10-CM | POA: Diagnosis not present

## 2021-12-08 DIAGNOSIS — M9903 Segmental and somatic dysfunction of lumbar region: Secondary | ICD-10-CM | POA: Diagnosis not present

## 2021-12-08 NOTE — Telephone Encounter (Signed)
Patient has procedure tomorrow.  She said she is supposed to start the second half of her prep at 6:30 in the morning, but she wanted to know if it would be ok to start it at 5:30 a.m. instead.  Please call patient and advise.  Thank you.

## 2021-12-08 NOTE — Telephone Encounter (Signed)
Called pt- instructed she may prep at 530 am tomorrow - pt verbalized understanding

## 2021-12-09 ENCOUNTER — Encounter: Payer: Self-pay | Admitting: Internal Medicine

## 2021-12-09 ENCOUNTER — Ambulatory Visit (AMBULATORY_SURGERY_CENTER): Payer: Medicare Other | Admitting: Internal Medicine

## 2021-12-09 VITALS — BP 108/66 | HR 57 | Temp 97.5°F | Resp 11 | Ht 64.0 in | Wt 154.0 lb

## 2021-12-09 DIAGNOSIS — Z1502 Genetic susceptibility to malignant neoplasm of ovary: Secondary | ICD-10-CM

## 2021-12-09 DIAGNOSIS — Z1509 Genetic susceptibility to other malignant neoplasm: Secondary | ICD-10-CM | POA: Diagnosis not present

## 2021-12-09 DIAGNOSIS — Z09 Encounter for follow-up examination after completed treatment for conditions other than malignant neoplasm: Secondary | ICD-10-CM

## 2021-12-09 DIAGNOSIS — Z1211 Encounter for screening for malignant neoplasm of colon: Secondary | ICD-10-CM

## 2021-12-09 DIAGNOSIS — Z86010 Personal history of colonic polyps: Secondary | ICD-10-CM | POA: Diagnosis not present

## 2021-12-09 DIAGNOSIS — Z1501 Genetic susceptibility to malignant neoplasm of breast: Secondary | ICD-10-CM

## 2021-12-09 DIAGNOSIS — F419 Anxiety disorder, unspecified: Secondary | ICD-10-CM | POA: Diagnosis not present

## 2021-12-09 MED ORDER — SODIUM CHLORIDE 0.9 % IV SOLN: 500.0000 mL | Freq: Once | INTRAVENOUS | Status: DC

## 2021-12-09 NOTE — Patient Instructions (Signed)
   Resume usual diet & medications   Repeat Colonoscopy in -5 years    YOU HAD AN ENDOSCOPIC PROCEDURE TODAY AT [facility]:   Refer to the procedure report that was given to you for any specific questions about what was found during the examination.  If the procedure report does not answer your questions, please call your gastroenterologist to clarify.  If you requested that your care partner not be given the details of your procedure findings, then the procedure report has been included in a sealed envelope for you to review at your convenience later.  YOU SHOULD EXPECT: Some feelings of bloating in the abdomen. Passage of more gas than usual.  Walking can help get rid of the air that was put into your GI tract during the procedure and reduce the bloating. If you had a lower endoscopy (such as a colonoscopy or flexible sigmoidoscopy) you may notice spotting of blood in your stool or on the toilet paper. If you underwent a bowel prep for your procedure, you may not have a normal bowel movement for a few days.  Please Note:  You might notice some irritation and congestion in your nose or some drainage.  This is from the oxygen used during your procedure.  There is no need for concern and it should clear up in a day or so.  SYMPTOMS TO REPORT IMMEDIATELY:  Following lower endoscopy (colonoscopy or flexible sigmoidoscopy):  Excessive amounts of blood in the stool  Significant tenderness or worsening of abdominal pains  Swelling of the abdomen that is new, acute  Fever of 100F or higher   For urgent or emergent issues, a gastroenterologist can be reached at any hour by calling [phone]. Do not use MyChart messaging for urgent concerns.    DIET:  We do recommend a small meal at first, but then you may proceed to your regular diet.  Drink plenty of fluids but you should avoid alcoholic beverages for 24 hours.  ACTIVITY:  You should plan to take it easy for the rest of  today and you should NOT DRIVE or use heavy machinery until tomorrow (because of the sedation medicines used during the test).    FOLLOW UP: Our staff will call the number listed on your records the next business day following your procedure.  We will call around 7:15- 8:00 am to check on you and address any questions or concerns that you may have regarding the information given to you following your procedure. If we do not reach you, we will leave a message.  If you develop any symptoms (ie: fever, flu-like symptoms, shortness of breath, cough etc.) before then, please call [phone].  If you test positive for Covid 19 in the 2 weeks post procedure, please call and report this information to [place].    If any biopsies were taken you will be contacted by phone or by letter within the next 1-3 weeks.  Please call us at [phone] if you have not heard about the biopsies in 3 weeks.    SIGNATURES/CONFIDENTIALITY: You and/or your care partner have signed paperwork which will be entered into your electronic medical record.  These signatures attest to the fact that that the information above on your After Visit Summary has been reviewed and is understood.  Full responsibility of the confidentiality of this discharge information lies with you and/or your care-partner.

## 2021-12-09 NOTE — Op Note (Signed)
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 The patient's tolerance of                            previous anesthesia was also reviewed. The risks                            and benefits of the procedure and the sedation                            options and risks were discussed with the patient.                            All questions were answered, and informed consent                            was obtained. Prior Anticoagulants: The patient has                            taken no previous anticoagulant or antiplatelet                            agents. ASA Grade Assessment: II - A patient with                            mild systemic disease. After reviewing the risks                            and benefits, the patient was deemed in                            satisfactory condition to undergo the procedure.  After obtaining informed consent, the colonoscope                            was passed under direct vision. Throughout the                            procedure, the patient's blood pressure, pulse, and                            oxygen saturations were monitored continuously. The                            Olympus PCF-H190DL (#[id number]) Colonoscope was                            introduced  through the anus and advanced to the                            terminal ileum. The colonoscopy was performed                            without difficulty. The patient tolerated the                            procedure well. The quality of the bowel                            preparation was excellent. The terminal ileum,                            ileocecal valve, appendiceal orifice, and rectum                            were photographed. Scope In: 11:44:48 AM Scope Out: 11:57:09 AM Scope Withdrawal Time: 0 hours 8 minutes 3 seconds  Total Procedure Duration: 0 hours 12 minutes 21 seconds  Findings:                 The digital rectal exam was normal.                           The terminal ileum appeared normal.                           The entire examined colon appeared normal on direct                            and retroflexion views. Complications:            No immediate complications. Estimated Blood Loss:     Estimated blood loss: none. Impression:               - The examined portion of the ileum was normal.                           - The entire examined colon is normal on  direct and                            retroflexion views.                           - No specimens collected. Recommendation:           - Patient has a contact number available for                            emergencies. The signs and symptoms of potential                            delayed complications were discussed with the                            patient. Return to normal activities tomorrow.                            Written discharge instructions were provided to the                            patient.                           - Resume previous diet.                           - Continue present medications.                           - Repeat colonoscopy in 3 - 5 years for screening                            purposes. Jerene Bears, MD 12/09/2021 12:00:52 PM This report has been signed  electronically.

## 2021-12-09 NOTE — Progress Notes (Signed)
To pacu, VSS. Report to [email or web address] 

## 2021-12-09 NOTE — Progress Notes (Signed)
GASTROENTEROLOGY PROCEDURE H&P NOTE   Primary Care Physician: Jinny Sanders, MD    Reason for Procedure:  Colon cancer screening and patient with CHEK2 mutation  Plan:    Colonoscopy  Patient is appropriate for endoscopic procedure(s) in the ambulatory ([facility]) setting.  The nature of the procedure, as well as the risks, benefits, and alternatives were carefully and thoroughly reviewed with the patient. Ample time for discussion and questions allowed. The patient understood, was satisfied, and agreed to proceed.     HPI: Rachael Sharp is a 71 y.o. female who presents for screening colonoscopy.  Medical history as below.  Tolerated the prep.  No recent chest pain or shortness of breath.  No abdominal pain today.  Past Medical History:  Diagnosis Date   Anxiety    Arthritis    finger left index   GERD (gastroesophageal reflux disease)    Heart murmur    mitral valve prolapse   Hyperlipidemia    denies 10/21/18,not on medication   Hyperplastic colon polyp    Monoallelic mutation of CHEK2 gene in female patient    Vitamin D deficiency     Past Surgical History:  Procedure Laterality Date   ABDOMINAL HYSTERECTOMY  12/2000   BREAST BIOPSY     biopsy x 2 , 1976 and 2002   BREAST CYST ASPIRATION     COLONOSCOPY  11/03/2018   Dr.Darnella Zeiter   COLONOSCOPY  2017   [facility]   TONSILLECTOMY  1964   TUBAL LIGATION  1982    Prior to Admission medications   Medication Sig Start Date End Date Taking? Authorizing Provider  amitriptyline (ELAVIL) 10 MG tablet Take 3 tablets (30 mg total) by mouth at bedtime. 10/07/21  Yes Bedsole, Amy E, MD  aspirin 81 MG EC tablet Take 1 tablet by mouth daily. 01/25/19  Yes [provider]  BIOTIN PO Take 5,000 mcg by mouth daily.   Yes [provider]  CALCIUM PO Take 1 tablet by mouth in the morning and at bedtime.   Yes [provider]  Glucosamine HCl-MSM (GLUCOSAMINE-MSM PO) Take 1 tablet by mouth  daily.   Yes [provider]  Multiple Vitamin (MULTIVITAMIN) tablet Take 2 tablets by mouth daily.    Yes [provider]  Omega-3 Fatty Acids (OMEGA 3 PO) Take 600 mg by mouth daily.    Yes [provider]  sertraline (ZOLOFT) 25 MG tablet TAKE 1 TABLET (25 MG TOTAL) BY MOUTH DAILY. 11/23/21  Yes Bedsole, Amy E, MD  tretinoin (RETIN-A) 0.025 % cream Apply topically at bedtime.   Yes [provider]  fluconazole (DIFLUCAN) 200 MG tablet Take 200 mg by mouth once a week. 10/18/21   [provider]    Current Outpatient Medications  Medication Sig Dispense Refill   amitriptyline (ELAVIL) 10 MG tablet Take 3 tablets (30 mg total) by mouth at bedtime. 270 tablet 3   aspirin 81 MG EC tablet Take 1 tablet by mouth daily.     BIOTIN PO Take 5,000 mcg by mouth daily.     CALCIUM PO Take 1 tablet by mouth in the morning and at bedtime.     Glucosamine HCl-MSM (GLUCOSAMINE-MSM PO) Take 1 tablet by mouth daily.     Multiple Vitamin (MULTIVITAMIN) tablet Take 2 tablets by mouth daily.      Omega-3 Fatty Acids (OMEGA 3 PO) Take 600 mg by mouth daily.      sertraline (ZOLOFT) 25 MG tablet  TAKE 1 TABLET (25 MG TOTAL) BY MOUTH DAILY. 90 tablet 1   tretinoin (RETIN-A) 0.025 % cream Apply topically at bedtime.     fluconazole (DIFLUCAN) 200 MG tablet Take 200 mg by mouth once a week.     Current Facility-Administered Medications  Medication Dose Route Frequency Provider Last Rate Last Admin   0.9 %  sodium chloride infusion  500 mL Intravenous Once Minda Faas, Lajuan Lines, MD        Allergies as of 12/09/2021 - Review Complete 12/09/2021  Allergen Reaction Noted   Penicillins  06/28/2008   Epinephrine Palpitations 12/21/2014    Family History  Problem Relation Age of Onset   COPD Sister    Heart disease Sister 28       massive CVA,     Stroke Sister    Stroke Mother    COPD Mother    Heart disease Father    Hyperlipidemia Father    Breast cancer Paternal Aunt     Breast cancer Paternal Aunt    Breast cancer Paternal Aunt    Breast cancer Paternal Aunt    Colon cancer Neg Hx    Colon polyps Neg Hx    Esophageal cancer Neg Hx    Rectal cancer Neg Hx    Stomach cancer Neg Hx     Social History   Socioeconomic History   Marital status: Married    Spouse name: Not on file   Number of children: Not on file   Years of education: Not on file   Highest education level: Not on file  Occupational History   Occupation: retired  Tobacco Use   Smoking status: Former    Packs/day: 0.25    Years: 20.00    Total pack years: 5.00    Types: Cigarettes    Quit date: 06/02/1989    Years since quitting: 32.5   Smokeless tobacco: Never   Tobacco comments:    Quit 1991  Vaping Use   Vaping Use: Never used  Substance and Sexual Activity   Alcohol use: Yes    Alcohol/week: 7.0 standard drinks of alcohol    Types: 7 Standard drinks or equivalent per week   Drug use: No   Sexual activity: Not Currently    Partners: Male    Comment: 1st intercourse-19, partners- 17, married- 64 yr s  Other Topics Concern   Not on file  Social History Narrative   Not on file   Social Determinants of Health   Financial Resource Strain: Low Risk  (01/25/2021)   Overall Financial Resource Strain (CARDIA)    Difficulty of Paying Living Expenses: Not hard at all  Food Insecurity: No Food Insecurity (01/25/2021)   Hunger Vital Sign    Worried About Running Out of Food in the Last Year: Never true    [place] in the Last Year: Never true  Transportation Needs: No Transportation Needs (01/25/2021)   PRAPARE - Hydrologist (Medical): No    Lack of Transportation (Non-Medical): No  Physical Activity: Sufficiently Active (01/25/2021)   Exercise Vital Sign    Days of Exercise per Week: 5 days    Minutes of Exercise per Session: 90 min  Stress: No Stress Concern Present (01/25/2021)   [place]    Feeling of Stress : Only a little  Social Connections: Moderately Integrated (01/25/2021)   Social Connection and Isolation Panel [NHANES]    Frequency  of Communication with Friends and Family: Twice a week    Frequency of Social Gatherings with Friends and Family: Twice a week    Attends Religious Services: Never    Marine scientist or Organizations: Yes    Attends Music therapist: More than 4 times per year    Marital Status: Married  Human resources officer Violence: Not At Risk (01/25/2021)   Humiliation, Afraid, Rape, and Kick questionnaire    Fear of Current or Ex-Partner: No    Emotionally Abused: No    Physically Abused: No    Sexually Abused: No    Physical Exam: Vital signs in last 24 hours: '@BP'  120/72   Pulse 65   Temp (!) 97.5 F (36.4 C)   Ht '5\' 4"'  (1.626 m)   Wt 154 lb (69.9 kg)   SpO2 99%   BMI 26.43 kg/m  GEN: NAD EYE: Sclerae anicteric ENT: MMM CV: Non-tachycardic Pulm: CTA b/l GI: Soft, NT/ND NEURO:  Alert & Oriented x 3   Zenovia Jarred, MD [facility]  12/09/2021 11:34 AM

## 2021-12-10 ENCOUNTER — Telehealth: Payer: Self-pay

## 2021-12-10 NOTE — Telephone Encounter (Signed)
Left message

## 2021-12-11 DIAGNOSIS — M9903 Segmental and somatic dysfunction of lumbar region: Secondary | ICD-10-CM | POA: Diagnosis not present

## 2021-12-11 DIAGNOSIS — M955 Acquired deformity of pelvis: Secondary | ICD-10-CM | POA: Diagnosis not present

## 2021-12-11 DIAGNOSIS — M5136 Other intervertebral disc degeneration, lumbar region: Secondary | ICD-10-CM | POA: Diagnosis not present

## 2021-12-11 DIAGNOSIS — M9905 Segmental and somatic dysfunction of pelvic region: Secondary | ICD-10-CM | POA: Diagnosis not present

## 2021-12-15 DIAGNOSIS — M5136 Other intervertebral disc degeneration, lumbar region: Secondary | ICD-10-CM | POA: Diagnosis not present

## 2021-12-15 DIAGNOSIS — M9903 Segmental and somatic dysfunction of lumbar region: Secondary | ICD-10-CM | POA: Diagnosis not present

## 2021-12-15 DIAGNOSIS — M9905 Segmental and somatic dysfunction of pelvic region: Secondary | ICD-10-CM | POA: Diagnosis not present

## 2021-12-15 DIAGNOSIS — M955 Acquired deformity of pelvis: Secondary | ICD-10-CM | POA: Diagnosis not present

## 2021-12-17 DIAGNOSIS — M9903 Segmental and somatic dysfunction of lumbar region: Secondary | ICD-10-CM | POA: Diagnosis not present

## 2021-12-17 DIAGNOSIS — M5136 Other intervertebral disc degeneration, lumbar region: Secondary | ICD-10-CM | POA: Diagnosis not present

## 2021-12-17 DIAGNOSIS — M955 Acquired deformity of pelvis: Secondary | ICD-10-CM | POA: Diagnosis not present

## 2021-12-17 DIAGNOSIS — M9905 Segmental and somatic dysfunction of pelvic region: Secondary | ICD-10-CM | POA: Diagnosis not present

## 2021-12-22 DIAGNOSIS — M955 Acquired deformity of pelvis: Secondary | ICD-10-CM | POA: Diagnosis not present

## 2021-12-22 DIAGNOSIS — M9903 Segmental and somatic dysfunction of lumbar region: Secondary | ICD-10-CM | POA: Diagnosis not present

## 2021-12-22 DIAGNOSIS — M9905 Segmental and somatic dysfunction of pelvic region: Secondary | ICD-10-CM | POA: Diagnosis not present

## 2021-12-22 DIAGNOSIS — M5136 Other intervertebral disc degeneration, lumbar region: Secondary | ICD-10-CM | POA: Diagnosis not present

## 2021-12-23 ENCOUNTER — Ambulatory Visit (INDEPENDENT_AMBULATORY_CARE_PROVIDER_SITE_OTHER): Payer: Medicare Other | Admitting: Family Medicine

## 2021-12-23 ENCOUNTER — Encounter: Payer: Self-pay | Admitting: Family Medicine

## 2021-12-23 VITALS — BP 104/64 | HR 72 | Ht 64.25 in | Wt 151.8 lb

## 2021-12-23 DIAGNOSIS — Z1502 Genetic susceptibility to malignant neoplasm of ovary: Secondary | ICD-10-CM | POA: Diagnosis not present

## 2021-12-23 DIAGNOSIS — Z1239 Encounter for other screening for malignant neoplasm of breast: Secondary | ICD-10-CM

## 2021-12-23 DIAGNOSIS — Z1501 Genetic susceptibility to malignant neoplasm of breast: Secondary | ICD-10-CM

## 2021-12-23 DIAGNOSIS — H6123 Impacted cerumen, bilateral: Secondary | ICD-10-CM | POA: Insufficient documentation

## 2021-12-23 DIAGNOSIS — Z1509 Genetic susceptibility to other malignant neoplasm: Secondary | ICD-10-CM | POA: Diagnosis not present

## 2021-12-23 DIAGNOSIS — Z1589 Genetic susceptibility to other disease: Secondary | ICD-10-CM

## 2021-12-23 NOTE — Progress Notes (Signed)
Patient ID: Rachael Sharp, female    DOB: 24-Jun-1950, 71 y.o.   MRN: [id number]  This visit was conducted in person.  BP 104/64   Pulse 72   Ht 5' 4.25" (1.632 m)   Wt 151 lb 12.8 oz (68.9 kg)   SpO2 98%   BMI 25.85 kg/m    CC: Chief Complaint  Patient presents with   Follow-up    Follow up for breast exam  ,  having she issues with one nipple that has some changes feel like going into the breast , no discharge, no rash , no change color or size , and look at one  right ear feels full     Subjective:   HPI: Rachael Sharp is a 71 y.o. female presenting on 12/23/2021 for Follow-up (Follow up for breast exam  ,  having she issues with one nipple that has some changes feel like going into the breast , no discharge, no rash , no change color or size , and look at one  right ear feels full )  She presents for 6 month breast exam given gene mutation.   Noted inverted nipple right last year... 07/2020 mammo was normal, cysts had shrunk. 09/2021 Diagnostic mammogram reviewed: normal. 11/23 MRI breast scheduled.   She feels that nipple on right has continued to invert more.  No nipple discharge, no pain, no lumps.         Relevant past medical, surgical, family and social history reviewed and updated as indicated. Interim medical history since our last visit reviewed. Allergies and medications reviewed and updated. Outpatient Medications Prior to Visit  Medication Sig Dispense Refill   amitriptyline (ELAVIL) 10 MG tablet Take 3 tablets (30 mg total) by mouth at bedtime. 270 tablet 3   aspirin 81 MG EC tablet Take 1 tablet by mouth daily.     BIOTIN PO Take 5,000 mcg by mouth daily.     CALCIUM PO Take 1 tablet by mouth in the morning and at bedtime.     fluconazole (DIFLUCAN) 200 MG tablet Take 200 mg by mouth once a week.     Glucosamine HCl-MSM (GLUCOSAMINE-MSM PO) Take 1 tablet by mouth daily.     Multiple Vitamin (MULTIVITAMIN) tablet Take 2 tablets by mouth  daily.      Omega-3 Fatty Acids (OMEGA 3 PO) Take 600 mg by mouth daily.      sertraline (ZOLOFT) 25 MG tablet TAKE 1 TABLET (25 MG TOTAL) BY MOUTH DAILY. 90 tablet 1   tretinoin (RETIN-A) 0.025 % cream Apply topically at bedtime.     No facility-administered medications prior to visit.     Per HPI unless specifically indicated in ROS section below Review of Systems  Constitutional:  Negative for fatigue and fever.  HENT:  Negative for congestion.   Eyes:  Negative for pain.  Respiratory:  Negative for cough and shortness of breath.   Cardiovascular:  Negative for chest pain, palpitations and leg swelling.  Gastrointestinal:  Negative for abdominal pain.  Genitourinary:  Negative for dysuria and vaginal bleeding.  Musculoskeletal:  Negative for back pain.  Neurological:  Negative for syncope, light-headedness and headaches.  Psychiatric/Behavioral:  Negative for dysphoric mood.    Objective:  BP 104/64   Pulse 72   Ht 5' 4.25" (1.632 m)   Wt 151 lb 12.8 oz (68.9 kg)   SpO2 98%   BMI 25.85 kg/m   Wt Readings from Last 3 Encounters:  12/23/21 151  lb 12.8 oz (68.9 kg)  12/09/21 154 lb (69.9 kg)  11/13/21 154 lb (69.9 kg)      Physical Exam Exam conducted with a chaperone present.  Constitutional:      Appearance: Normal appearance.  HENT:     Head: Normocephalic.     Right Ear: There is impacted cerumen.     Left Ear: There is impacted cerumen.     Nose: Nose normal.  Cardiovascular:     Rate and Rhythm: Normal rate.     Pulses: Normal pulses.  Pulmonary:     Effort: Pulmonary effort is normal.  Chest:  Breasts:    Breasts are asymmetrical.     Right: Inverted nipple present. No swelling, mass, nipple discharge, skin change or tenderness.     Left: Normal. No swelling, inverted nipple, mass, nipple discharge, skin change or tenderness.  Musculoskeletal:     Cervical back: Normal range of motion.  Lymphadenopathy:     Upper Body:     Right upper body: No  supraclavicular, axillary or pectoral adenopathy.     Left upper body: No supraclavicular, axillary or pectoral adenopathy.  Neurological:     Mental Status: She is alert.        Results for orders placed or performed in visit on 01/22/21  Comprehensive metabolic panel  Result Value Ref Range   Sodium 136 135 - 145 mEq/L   Potassium 4.6 3.5 - 5.1 mEq/L   Chloride 100 96 - 112 mEq/L   CO2 28 19 - 32 mEq/L   Glucose, Bld 112 (H) 70 - 99 mg/dL   BUN 21 6 - 23 mg/dL   Creatinine, Ser 1.07 0.40 - 1.20 mg/dL   Total Bilirubin 0.4 0.2 - 1.2 mg/dL   Alkaline Phosphatase 49 39 - 117 U/L   AST 25 0 - 37 U/L   ALT 27 0 - 35 U/L   Total Protein 7.2 6.0 - 8.3 g/dL   Albumin 4.6 3.5 - 5.2 g/dL   GFR 52.82 (L) >60.00 mL/min   Calcium 10.0 8.4 - 10.5 mg/dL  Lipid panel  Result Value Ref Range   Cholesterol 228 (H) 0 - 200 mg/dL   Triglycerides 71.0 0.0 - 149.0 mg/dL   HDL 87.50 >39.00 mg/dL   VLDL 14.2 0.0 - 40.0 mg/dL   LDL Cholesterol 126 (H) 0 - 99 mg/dL   Total CHOL/HDL Ratio 3    NonHDL 140.21   Hemoglobin A1c  Result Value Ref Range   Hgb A1c MFr Bld 6.1 4.6 - 6.5 %     COVID 19 screen:  No recent travel or known exposure to COVID19 The patient denies respiratory symptoms of COVID 19 at this time. The importance of social distancing was discussed today.   Assessment and Plan Problem List Items Addressed This Visit     Bilateral impacted cerumen    She will start a course of Debrox outpatient and will return if ear irrigation needed.      Breast cancer screening, high risk patient   Monoallelic mutation of CHEK2 gene in female patient - Primary    Performed manual breast exam today.  Minimal changes with right nipple.  Scheduled for MRI breast in November.  Screening mammogram was within normal range.          Eliezer Lofts, MD

## 2021-12-23 NOTE — Assessment & Plan Note (Signed)
She will start a course of Debrox outpatient and will return if ear irrigation needed.

## 2021-12-23 NOTE — Assessment & Plan Note (Signed)
Performed manual breast exam today.  Minimal changes with right nipple.  Scheduled for MRI breast in November.  Screening mammogram was within normal range.

## 2021-12-29 DIAGNOSIS — M5136 Other intervertebral disc degeneration, lumbar region: Secondary | ICD-10-CM | POA: Diagnosis not present

## 2021-12-29 DIAGNOSIS — M955 Acquired deformity of pelvis: Secondary | ICD-10-CM | POA: Diagnosis not present

## 2021-12-29 DIAGNOSIS — M9903 Segmental and somatic dysfunction of lumbar region: Secondary | ICD-10-CM | POA: Diagnosis not present

## 2021-12-29 DIAGNOSIS — M9905 Segmental and somatic dysfunction of pelvic region: Secondary | ICD-10-CM | POA: Diagnosis not present

## 2021-12-31 DIAGNOSIS — M9905 Segmental and somatic dysfunction of pelvic region: Secondary | ICD-10-CM | POA: Diagnosis not present

## 2021-12-31 DIAGNOSIS — M955 Acquired deformity of pelvis: Secondary | ICD-10-CM | POA: Diagnosis not present

## 2021-12-31 DIAGNOSIS — M9903 Segmental and somatic dysfunction of lumbar region: Secondary | ICD-10-CM | POA: Diagnosis not present

## 2021-12-31 DIAGNOSIS — M5136 Other intervertebral disc degeneration, lumbar region: Secondary | ICD-10-CM | POA: Diagnosis not present

## 2022-01-06 DIAGNOSIS — M955 Acquired deformity of pelvis: Secondary | ICD-10-CM | POA: Diagnosis not present

## 2022-01-06 DIAGNOSIS — M9905 Segmental and somatic dysfunction of pelvic region: Secondary | ICD-10-CM | POA: Diagnosis not present

## 2022-01-06 DIAGNOSIS — M9903 Segmental and somatic dysfunction of lumbar region: Secondary | ICD-10-CM | POA: Diagnosis not present

## 2022-01-06 DIAGNOSIS — M5136 Other intervertebral disc degeneration, lumbar region: Secondary | ICD-10-CM | POA: Diagnosis not present

## 2022-01-13 DIAGNOSIS — M5136 Other intervertebral disc degeneration, lumbar region: Secondary | ICD-10-CM | POA: Diagnosis not present

## 2022-01-13 DIAGNOSIS — M9903 Segmental and somatic dysfunction of lumbar region: Secondary | ICD-10-CM | POA: Diagnosis not present

## 2022-01-13 DIAGNOSIS — M955 Acquired deformity of pelvis: Secondary | ICD-10-CM | POA: Diagnosis not present

## 2022-01-13 DIAGNOSIS — M9905 Segmental and somatic dysfunction of pelvic region: Secondary | ICD-10-CM | POA: Diagnosis not present

## 2022-01-22 ENCOUNTER — Ambulatory Visit (INDEPENDENT_AMBULATORY_CARE_PROVIDER_SITE_OTHER): Payer: Medicare Other | Admitting: Family Medicine

## 2022-01-22 ENCOUNTER — Encounter: Payer: Self-pay | Admitting: Family Medicine

## 2022-01-22 ENCOUNTER — Ambulatory Visit: Payer: Medicare Other

## 2022-01-22 VITALS — BP 100/76 | HR 67 | Temp 97.9°F | Ht 64.25 in | Wt 151.4 lb

## 2022-01-22 DIAGNOSIS — H6123 Impacted cerumen, bilateral: Secondary | ICD-10-CM

## 2022-01-22 DIAGNOSIS — M9905 Segmental and somatic dysfunction of pelvic region: Secondary | ICD-10-CM | POA: Diagnosis not present

## 2022-01-22 DIAGNOSIS — M9903 Segmental and somatic dysfunction of lumbar region: Secondary | ICD-10-CM | POA: Diagnosis not present

## 2022-01-22 DIAGNOSIS — M955 Acquired deformity of pelvis: Secondary | ICD-10-CM | POA: Diagnosis not present

## 2022-01-22 DIAGNOSIS — M5136 Other intervertebral disc degeneration, lumbar region: Secondary | ICD-10-CM | POA: Diagnosis not present

## 2022-01-22 NOTE — Progress Notes (Signed)
Patient ID: Rachael Sharp, female    DOB: 17-Sep-1950, 71 y.o.   MRN: [id number]  This visit was conducted in person.  BP 100/76   Pulse 67   Temp 97.9 F (36.6 C) (Oral)   Ht 5' 4.25" (1.632 m)   Wt 151 lb 6 oz (68.7 kg)   SpO2 98%   BMI 25.78 kg/m    CC:  cerumen impaction  Subjective:   HPI: Rachael Sharp is a 71 y.o. female presenting on 01/22/2022 for ear wax issue.  She has been using earwax drops without improvement with fullness in bilateral ears.  No pain, no fever, no URI symptoms.        Relevant past medical, surgical, family and social history reviewed and updated as indicated. Interim medical history since our last visit reviewed. Allergies and medications reviewed and updated. Outpatient Medications Prior to Visit  Medication Sig Dispense Refill   amitriptyline (ELAVIL) 10 MG tablet Take 3 tablets (30 mg total) by mouth at bedtime. 270 tablet 3   aspirin 81 MG EC tablet Take 1 tablet by mouth daily.     BIOTIN PO Take 5,000 mcg by mouth daily.     CALCIUM PO Take 1 tablet by mouth in the morning and at bedtime.     Glucosamine HCl-MSM (GLUCOSAMINE-MSM PO) Take 1 tablet by mouth daily.     Multiple Vitamin (MULTIVITAMIN) tablet Take 2 tablets by mouth daily.      Omega-3 Fatty Acids (OMEGA 3 PO) Take 600 mg by mouth daily.      sertraline (ZOLOFT) 25 MG tablet TAKE 1 TABLET (25 MG TOTAL) BY MOUTH DAILY. 90 tablet 1   tretinoin (RETIN-A) 0.025 % cream Apply topically at bedtime.     fluconazole (DIFLUCAN) 200 MG tablet Take 200 mg by mouth once a week.     No facility-administered medications prior to visit.     Per HPI unless specifically indicated in ROS section below Review of Systems  Constitutional:  Negative for fatigue and fever.  HENT:  Negative for ear pain.   Eyes:  Negative for pain.  Respiratory:  Negative for chest tightness and shortness of breath.   Cardiovascular:  Negative for chest pain, palpitations and leg swelling.   Gastrointestinal:  Negative for abdominal pain.  Genitourinary:  Negative for dysuria.   Objective:  BP 100/76   Pulse 67   Temp 97.9 F (36.6 C) (Oral)   Ht 5' 4.25" (1.632 m)   Wt 151 lb 6 oz (68.7 kg)   SpO2 98%   BMI 25.78 kg/m   Wt Readings from Last 3 Encounters:  01/22/22 151 lb 6 oz (68.7 kg)  12/23/21 151 lb 12.8 oz (68.9 kg)  12/09/21 154 lb (69.9 kg)      Physical Exam After irrigation: TMs clear    Results for orders placed or performed in visit on 01/22/21  Comprehensive metabolic panel  Result Value Ref Range   Sodium 136 135 - 145 mEq/L   Potassium 4.6 3.5 - 5.1 mEq/L   Chloride 100 96 - 112 mEq/L   CO2 28 19 - 32 mEq/L   Glucose, Bld 112 (H) 70 - 99 mg/dL   BUN 21 6 - 23 mg/dL   Creatinine, Ser 1.07 0.40 - 1.20 mg/dL   Total Bilirubin 0.4 0.2 - 1.2 mg/dL   Alkaline Phosphatase 49 39 - 117 U/L   AST 25 0 - 37 U/L   ALT 27 0 - 35 U/L  Total Protein 7.2 6.0 - 8.3 g/dL   Albumin 4.6 3.5 - 5.2 g/dL   GFR 52.82 (L) >60.00 mL/min   Calcium 10.0 8.4 - 10.5 mg/dL  Lipid panel  Result Value Ref Range   Cholesterol 228 (H) 0 - 200 mg/dL   Triglycerides 71.0 0.0 - 149.0 mg/dL   HDL 87.50 >39.00 mg/dL   VLDL 14.2 0.0 - 40.0 mg/dL   LDL Cholesterol 126 (H) 0 - 99 mg/dL   Total CHOL/HDL Ratio 3    NonHDL 140.21   Hemoglobin A1c  Result Value Ref Range   Hgb A1c MFr Bld 6.1 4.6 - 6.5 %     COVID 19 screen:  No recent travel or known exposure to COVID19 The patient denies respiratory symptoms of COVID 19 at this time. The importance of social distancing was discussed today.   Assessment and Plan  Patient comes in today for a nurse visit to have wax removed from both ears.   Cerumen impaction removal via irrigation Performed by :  Hedy Camara , CMA Consent for procedure obtained verbally. Bilateral ears lavaged/ irrigated with warm water gently. Particles of  wax returned with the irrigation solution from both ears . Pt tolerated procedure well,  with no complications. After procedure ears clear of cerumen impaction, with minimal ear canal irritation and redness, no bleeding. TM intact and symptoms improved.   Eliezer Lofts, MD

## 2022-02-03 ENCOUNTER — Other Ambulatory Visit: Payer: Self-pay | Admitting: Family Medicine

## 2022-02-03 ENCOUNTER — Encounter: Payer: Self-pay | Admitting: Family Medicine

## 2022-02-03 MED ORDER — MOLNUPIRAVIR EUA 200MG CAPSULE: 4.0000 | ORAL_CAPSULE | Freq: Two times a day (BID) | ORAL | 0 refills | Status: AC

## 2022-02-05 DIAGNOSIS — M955 Acquired deformity of pelvis: Secondary | ICD-10-CM | POA: Diagnosis not present

## 2022-02-05 DIAGNOSIS — M9903 Segmental and somatic dysfunction of lumbar region: Secondary | ICD-10-CM | POA: Diagnosis not present

## 2022-02-05 DIAGNOSIS — M9905 Segmental and somatic dysfunction of pelvic region: Secondary | ICD-10-CM | POA: Diagnosis not present

## 2022-02-05 DIAGNOSIS — M5136 Other intervertebral disc degeneration, lumbar region: Secondary | ICD-10-CM | POA: Diagnosis not present

## 2022-02-10 ENCOUNTER — Encounter: Payer: Self-pay | Admitting: Family Medicine

## 2022-02-10 DIAGNOSIS — N6459 Other signs and symptoms in breast: Secondary | ICD-10-CM

## 2022-02-10 DIAGNOSIS — Z1501 Genetic susceptibility to malignant neoplasm of breast: Secondary | ICD-10-CM

## 2022-02-11 NOTE — Telephone Encounter (Signed)
I called [facility] to find out what needed to be done.  They suggest that you place a new order for a breast MRI with the new diagnosis.  Then they will get patient scheduled and their authorization department will contact insurance for approval if needed.

## 2022-02-18 ENCOUNTER — Ambulatory Visit
Admission: RE | Admit: 2022-02-18 | Discharge: 2022-02-18 | Disposition: A | Discharge status: Home / Self Care | Payer: Medicare Other | Source: Ambulatory Visit | Attending: Family Medicine | Admitting: Family Medicine

## 2022-02-18 DIAGNOSIS — N6459 Other signs and symptoms in breast: Secondary | ICD-10-CM

## 2022-02-18 DIAGNOSIS — N6011 Diffuse cystic mastopathy of right breast: Secondary | ICD-10-CM | POA: Diagnosis not present

## 2022-02-18 DIAGNOSIS — Z1501 Genetic susceptibility to malignant neoplasm of breast: Secondary | ICD-10-CM

## 2022-02-18 MED ORDER — GADOBUTROL 1 MMOL/ML IV SOLN
7.0000 mL | Freq: Once | INTRAVENOUS | Status: AC | PRN
  Administered 2022-02-18: 7 mL via INTRAVENOUS

## 2022-02-19 ENCOUNTER — Other Ambulatory Visit: Payer: Self-pay | Admitting: Family Medicine

## 2022-02-19 ENCOUNTER — Encounter: Payer: Self-pay | Admitting: Family Medicine

## 2022-02-19 DIAGNOSIS — Z1501 Genetic susceptibility to malignant neoplasm of breast: Secondary | ICD-10-CM

## 2022-02-19 DIAGNOSIS — N6459 Other signs and symptoms in breast: Secondary | ICD-10-CM

## 2022-02-19 NOTE — Telephone Encounter (Signed)
Rachael Sharp notified by telephone that [name] will not read the MRI until she has a diagnostic Mammogram and Ultrasound of her right breast due to this being a new problem. They use the mammogram and ultrasound results to correlate with the MRI.  Patient states understanding.  She states they do have her scheduled for this tomorrow afternoon.

## 2022-02-19 NOTE — Telephone Encounter (Signed)
Rachael Sharp from [facility] called in regarding pt requesting a call back # [phone] stated she take's lunch at 11:30

## 2022-02-19 NOTE — Telephone Encounter (Signed)
Rachael Sharp notified by telephone that Rachael Sharp will not read the MRI until she has a diagnostic Mammogram and Ultrasound of her right breast due to this being a new problem. They use the mammogram and ultrasound results to correlate with the MRI.  Patient states understanding.  She states they do have her scheduled for this tomorrow afternoon.

## 2022-02-19 NOTE — Telephone Encounter (Signed)
Patient called in and wanted to speak with someone on why she has to have a diagnostic mammogram after she just had a MRI done yesterday. She would like for someone to give her a call at [phone]. She is very anxious and wants to know what is going on. Thank you!

## 2022-02-19 NOTE — Telephone Encounter (Signed)
Spoke with Rachael Sharp at the [facility].  She states Rachael Sharp had her MRI but they will not read the MRI until patient has a diagnostic Mammogram and Ultrasound of her right breast due to this being a new problem. They use the mammogram and ultrasound results to correlate with the MRI.  Patient can have this done at [facility] or [facility] depending on patient's preference. Rachael Sharp states she also sent Dr. Diona Browner a message in [place] yesterday in regards to this.

## 2022-02-20 ENCOUNTER — Ambulatory Visit
Admission: RE | Admit: 2022-02-20 | Discharge: 2022-02-20 | Disposition: A | Discharge status: Home / Self Care | Payer: Medicare Other | Source: Ambulatory Visit | Attending: Family Medicine | Admitting: Family Medicine

## 2022-02-20 DIAGNOSIS — M9903 Segmental and somatic dysfunction of lumbar region: Secondary | ICD-10-CM | POA: Diagnosis not present

## 2022-02-20 DIAGNOSIS — N6452 Nipple discharge: Secondary | ICD-10-CM | POA: Diagnosis not present

## 2022-02-20 DIAGNOSIS — N6459 Other signs and symptoms in breast: Secondary | ICD-10-CM

## 2022-02-20 DIAGNOSIS — M9905 Segmental and somatic dysfunction of pelvic region: Secondary | ICD-10-CM | POA: Diagnosis not present

## 2022-02-20 DIAGNOSIS — Z1502 Genetic susceptibility to malignant neoplasm of ovary: Secondary | ICD-10-CM

## 2022-02-20 DIAGNOSIS — M5136 Other intervertebral disc degeneration, lumbar region: Secondary | ICD-10-CM | POA: Diagnosis not present

## 2022-02-20 DIAGNOSIS — M955 Acquired deformity of pelvis: Secondary | ICD-10-CM | POA: Diagnosis not present

## 2022-02-23 ENCOUNTER — Other Ambulatory Visit: Payer: Self-pay | Admitting: Family Medicine

## 2022-02-23 ENCOUNTER — Encounter: Payer: Self-pay | Admitting: Family Medicine

## 2022-02-23 DIAGNOSIS — R9389 Abnormal findings on diagnostic imaging of other specified body structures: Secondary | ICD-10-CM

## 2022-02-24 ENCOUNTER — Other Ambulatory Visit: Payer: Self-pay | Admitting: Family Medicine

## 2022-02-24 MED ORDER — DIAZEPAM 5 MG PO TABS: 2.5000 mg | ORAL_TABLET | Freq: Every day | ORAL | 0 refills | Status: DC | PRN

## 2022-02-25 NOTE — Telephone Encounter (Signed)
Called  and discussed MR results with patient in detail. She is scheduled for biopsy Friday 10/20/023

## 2022-02-25 NOTE — Telephone Encounter (Signed)
[facility] Night - Client Nonclinical Telephone Record  [facility] Client [facility] Night - Client Client Site [place] - Night Provider Eliezer Lofts - MD Contact Type Call Who Is Calling Patient / Member / Family / Caregiver Caller Name France Lusty Caller Phone Number [phone] Call Type Message Only Information Provided Reason for Call Returning a Call from the Office Initial Clay states she is returning a call from the office. She couldn't make it to the phone. Disp. Time Disposition Final User 02/24/2022 5:09:36 PM General Information Provided Yes Maureen Ralphs Call Closed By: Maureen Ralphs Transaction Date/Time: 02/24/2022 5:07:16 PM (ET

## 2022-02-27 ENCOUNTER — Other Ambulatory Visit: Payer: Medicare Other

## 2022-02-27 ENCOUNTER — Ambulatory Visit
Admission: RE | Admit: 2022-02-27 | Discharge: 2022-02-27 | Disposition: A | Discharge status: Home / Self Care | Payer: Medicare Other | Source: Ambulatory Visit | Attending: Family Medicine | Admitting: Family Medicine

## 2022-02-27 DIAGNOSIS — D241 Benign neoplasm of right breast: Secondary | ICD-10-CM | POA: Diagnosis not present

## 2022-02-27 DIAGNOSIS — R9389 Abnormal findings on diagnostic imaging of other specified body structures: Secondary | ICD-10-CM

## 2022-02-27 DIAGNOSIS — R928 Other abnormal and inconclusive findings on diagnostic imaging of breast: Secondary | ICD-10-CM | POA: Diagnosis not present

## 2022-02-27 MED ORDER — GADOPICLENOL 0.5 MMOL/ML IV SOLN
7.0000 mL | Freq: Once | INTRAVENOUS | Status: AC | PRN
  Administered 2022-02-27: 7 mL via INTRAVENOUS

## 2022-03-02 ENCOUNTER — Encounter: Payer: Self-pay | Admitting: Family Medicine

## 2022-03-04 ENCOUNTER — Ambulatory Visit (INDEPENDENT_AMBULATORY_CARE_PROVIDER_SITE_OTHER): Payer: Medicare Other

## 2022-03-04 VITALS — Ht 64.25 in | Wt 151.0 lb

## 2022-03-04 DIAGNOSIS — Z0000 Encounter for general adult medical examination without abnormal findings: Secondary | ICD-10-CM | POA: Diagnosis not present

## 2022-03-04 NOTE — Patient Instructions (Signed)
Rachael Sharp , Thank you for taking time to come for your Medicare Wellness Visit. I appreciate your ongoing commitment to your health goals. Please review the following plan we discussed and let me know if I can assist you in the future.   Screening recommendations/referrals: Colonoscopy: 12/09/21 Mammogram: 02/20/22 Bone Density: 03/10/18, every 5 years Recommended yearly ophthalmology/optometry visit for glaucoma screening and checkup Recommended yearly dental visit for hygiene and checkup  Vaccinations: Influenza vaccine: 01/16/21 Pneumococcal vaccine: 03/17/18 Tdap vaccine: 11/09/10, due if have injury Shingles vaccine: Zostavax 03/08/12   Shingrix 02/07/20, 06/04/20   Covid-19:06/17/19, 07/12/19, 03/09/20, 08/13/20, 08/30/21, 02/01/22  Advanced directives: no  Conditions/risks identified: none  Next appointment: Follow up in one year for your annual wellness visit 03/08/23 @ 11:30 am by phone   Preventive Care 65 Years and Older, Female Preventive care refers to lifestyle choices and visits with your health care provider that can promote health and wellness. What does preventive care include? A yearly physical exam. This is also called an annual well check. Dental exams once or twice a year. Routine eye exams. Ask your health care provider how often you should have your eyes checked. Personal lifestyle choices, including: Daily care of your teeth and gums. Regular physical activity. Eating a healthy diet. Avoiding tobacco and drug use. Limiting alcohol use. Practicing safe sex. Taking low-dose aspirin every day. Taking vitamin and mineral supplements as recommended by your health care provider. What happens during an annual well check? The services and screenings done by your health care provider during your annual well check will depend on your age, overall health, lifestyle risk factors, and family history of disease. Counseling  Your health care provider may ask you questions  about your: Alcohol use. Tobacco use. Drug use. Emotional well-being. Home and relationship well-being. Sexual activity. Eating habits. History of falls. Memory and ability to understand (cognition). Work and work Statistician. Reproductive health. Screening  You may have the following tests or measurements: Height, weight, and BMI. Blood pressure. Lipid and cholesterol levels. These may be checked every 5 years, or more frequently if you are over 42 years old. Skin check. Lung cancer screening. You may have this screening every year starting at age 78 if you have a 30-pack-year history of smoking and currently smoke or have quit within the past 15 years. Fecal occult blood test (FOBT) of the stool. You may have this test every year starting at age 53. Flexible sigmoidoscopy or colonoscopy. You may have a sigmoidoscopy every 5 years or a colonoscopy every 10 years starting at age 31. Hepatitis C blood test. Hepatitis B blood test. Sexually transmitted disease (STD) testing. Diabetes screening. This is done by checking your blood sugar (glucose) after you have not eaten for a while (fasting). You may have this done every 1-3 years. Bone density scan. This is done to screen for osteoporosis. You may have this done starting at age 30. Mammogram. This may be done every 1-2 years. Talk to your health care provider about how often you should have regular mammograms. Talk with your health care provider about your test results, treatment options, and if necessary, the need for more tests. Vaccines  Your health care provider may recommend certain vaccines, such as: Influenza vaccine. This is recommended every year. Tetanus, diphtheria, and acellular pertussis (Tdap, Td) vaccine. You may need a Td booster every 10 years. Zoster vaccine. You may need this after age 11. Pneumococcal 13-valent conjugate (PCV13) vaccine. One dose is recommended after age 44.  Pneumococcal polysaccharide (PPSV23)  vaccine. One dose is recommended after age 78. Talk to your health care provider about which screenings and vaccines you need and how often you need them. This information is not intended to replace advice given to you by your health care provider. Make sure you discuss any questions you have with your health care provider. Document Released: 05/24/2015 Document Revised: 01/15/2016 Document Reviewed: 02/26/2015 Elsevier Interactive Patient Education  2017 [employer] Prevention in the Home Falls can cause injuries. They can happen to people of all ages. There are many things you can do to make your home safe and to help prevent falls. What can I do on the outside of my home? Regularly fix the edges of walkways and driveways and fix any cracks. Remove anything that might make you trip as you walk through a door, such as a raised step or threshold. Trim any bushes or trees on the path to your home. Use bright outdoor lighting. Clear any walking paths of anything that might make someone trip, such as rocks or tools. Regularly check to see if handrails are loose or broken. Make sure that both sides of any steps have handrails. Any raised decks and porches should have guardrails on the edges. Have any leaves, snow, or ice cleared regularly. Use sand or salt on walking paths during winter. Clean up any spills in your garage right away. This includes oil or grease spills. What can I do in the bathroom? Use night lights. Install grab bars by the toilet and in the tub and shower. Do not use towel bars as grab bars. Use non-skid mats or decals in the tub or shower. If you need to sit down in the shower, use a plastic, non-slip stool. Keep the floor dry. Clean up any water that spills on the floor as soon as it happens. Remove soap buildup in the tub or shower regularly. Attach bath mats securely with double-sided non-slip rug tape. Do not have throw rugs and other things on the floor that  can make you trip. What can I do in the bedroom? Use night lights. Make sure that you have a light by your bed that is easy to reach. Do not use any sheets or blankets that are too big for your bed. They should not hang down onto the floor. Have a firm chair that has side arms. You can use this for support while you get dressed. Do not have throw rugs and other things on the floor that can make you trip. What can I do in the kitchen? Clean up any spills right away. Avoid walking on wet floors. Keep items that you use a lot in easy-to-reach places. If you need to reach something above you, use a strong step stool that has a grab bar. Keep electrical cords out of the way. Do not use floor polish or wax that makes floors slippery. If you must use wax, use non-skid floor wax. Do not have throw rugs and other things on the floor that can make you trip. What can I do with my stairs? Do not leave any items on the stairs. Make sure that there are handrails on both sides of the stairs and use them. Fix handrails that are broken or loose. Make sure that handrails are as long as the stairways. Check any carpeting to make sure that it is firmly attached to the stairs. Fix any carpet that is loose or worn. Avoid having throw rugs at the  top or bottom of the stairs. If you do have throw rugs, attach them to the floor with carpet tape. Make sure that you have a light switch at the top of the stairs and the bottom of the stairs. If you do not have them, ask someone to add them for you. What else can I do to help prevent falls? Wear shoes that: Do not have high heels. Have rubber bottoms. Are comfortable and fit you well. Are closed at the toe. Do not wear sandals. If you use a stepladder: Make sure that it is fully opened. Do not climb a closed stepladder. Make sure that both sides of the stepladder are locked into place. Ask someone to hold it for you, if possible. Clearly mark and make sure that you  can see: Any grab bars or handrails. First and last steps. Where the edge of each step is. Use tools that help you move around (mobility aids) if they are needed. These include: Canes. Walkers. Scooters. Crutches. Turn on the lights when you go into a dark area. Replace any light bulbs as soon as they burn out. Set up your furniture so you have a clear path. Avoid moving your furniture around. If any of your floors are uneven, fix them. If there are any pets around you, be aware of where they are. Review your medicines with your doctor. Some medicines can make you feel dizzy. This can increase your chance of falling. Ask your doctor what other things that you can do to help prevent falls. This information is not intended to replace advice given to you by your health care provider. Make sure you discuss any questions you have with your health care provider. Document Released: 02/21/2009 Document Revised: 10/03/2015 Document Reviewed: 06/01/2014 Elsevier Interactive Patient Education  2017 [employer].

## 2022-03-04 NOTE — Progress Notes (Signed)
Virtual Visit via Telephone Note  I connected with  Rachael Sharp on 03/04/22 at  9:00 AM EDT by telephone and verified that I am speaking with the correct person using two identifiers.  Location: Patient: home Provider: [facility] Persons participating in the virtual visit: [place]   I discussed the limitations, risks, security and privacy concerns of performing an evaluation and management service by telephone and the availability of in person appointments. The patient expressed understanding and agreed to proceed.  Interactive audio and video telecommunications were attempted between this nurse and patient, however failed, due to patient having technical difficulties OR patient did not have access to video capability.  We continued and completed visit with audio only.  Some vital signs may be absent or patient reported.   Dionisio David, LPN  Subjective:   Rachael Sharp is a 71 y.o. female who presents for Medicare Annual (Subsequent) preventive examination.  Review of Systems     Cardiac Risk Factors include: advanced age (>47mn, >>83women)     Objective:    There were no vitals filed for this visit. There is no height or weight on file to calculate BMI.     03/04/2022    9:07 AM 01/25/2021    9:49 AM 01/23/2020    9:57 AM 01/20/2019    2:12 PM 01/12/2018    4:06 PM 02/10/2017    2:53 PM 01/28/2017    1:35 PM  Advanced Directives  Does Patient Have a Medical Advance Directive? No Yes Yes Yes Yes Yes Yes  Type of AComptrollerLiving will H[place]Living will HWalla Walla EastLiving will H[place]itoLiving will HLequireLiving will  Copy of [place]in Chart?  No - copy requested No - copy requested No - copy requested No - copy requested    Would patient like information on creating a medical advance directive?  No - Patient declined          Current Medications (verified) Outpatient Encounter Medications as of 03/04/2022  Medication Sig   amitriptyline (ELAVIL) 10 MG tablet Take 3 tablets (30 mg total) by mouth at bedtime.   aspirin 81 MG EC tablet Take 1 tablet by mouth daily.   BIOTIN PO Take 5,000 mcg by mouth daily.   CALCIUM PO Take 1 tablet by mouth in the morning and at bedtime.   Glucosamine HCl-MSM (GLUCOSAMINE-MSM PO) Take 1 tablet by mouth daily.   Multiple Vitamin (MULTIVITAMIN) tablet Take 2 tablets by mouth daily.    Omega-3 Fatty Acids (OMEGA 3 PO) Take 600 mg by mouth daily.    sertraline (ZOLOFT) 25 MG tablet TAKE 1 TABLET (25 MG TOTAL) BY MOUTH DAILY.   tretinoin (RETIN-A) 0.025 % cream Apply topically at bedtime.   [DISCONTINUED] diazepam (VALIUM) 5 MG tablet Take 0.5-1 tablets (2.5-5 mg total) by mouth daily as needed for anxiety (prior to procedure, may repeat 30 min later if needed).   [DISCONTINUED] estradiol (VIVELLE-DOT) 0.025 MG/24HR PLACE ONE PATCH ONTO THE SKIN 2 (TWO) TIMES A WEEK. (Patient not taking: Reported on 03/04/2022)   No facility-administered encounter medications on file as of 03/04/2022.    Allergies (verified) Penicillins and Epinephrine   History: Past Medical History:  Diagnosis Date   Anxiety    Arthritis    finger left index   GERD (gastroesophageal reflux disease)    Heart murmur    mitral  valve prolapse   Hyperlipidemia    denies 10/21/18,not on medication   Hyperplastic colon polyp    Monoallelic mutation of CHEK2 gene in female patient    Vitamin D deficiency    Past Surgical History:  Procedure Laterality Date   ABDOMINAL HYSTERECTOMY  12/2000   BREAST BIOPSY     biopsy x 2 , 1976 and 2002   BREAST CYST ASPIRATION     COLONOSCOPY  11/03/2018   Dr.Pyrtle   COLONOSCOPY  2017   Corinne   TUBAL LIGATION  1982   Family History  Problem Relation Age of Onset   COPD Sister    Heart disease  Sister 63       massive CVA,     Stroke Sister    Stroke Mother    COPD Mother    Heart disease Father    Hyperlipidemia Father    Breast cancer Paternal Aunt    Breast cancer Paternal Aunt    Breast cancer Paternal Aunt    Breast cancer Paternal Aunt    Colon cancer Neg Hx    Colon polyps Neg Hx    Esophageal cancer Neg Hx    Rectal cancer Neg Hx    Stomach cancer Neg Hx    Social History   Socioeconomic History   Marital status: Married    Spouse name: Not on file   Number of children: Not on file   Years of education: Not on file   Highest education level: Not on file  Occupational History   Occupation: retired  Tobacco Use   Smoking status: Former    Packs/day: 0.25    Years: 20.00    Total pack years: 5.00    Types: Cigarettes    Quit date: 06/02/1989    Years since quitting: 32.7   Smokeless tobacco: Never   Tobacco comments:    Quit 1991  Vaping Use   Vaping Use: Never used  Substance and Sexual Activity   Alcohol use: Yes    Alcohol/week: 7.0 standard drinks of alcohol    Types: 7 Standard drinks or equivalent per week   Drug use: No   Sexual activity: Not Currently    Partners: Male    Comment: 1st intercourse-19, partners- 46, married- 22 yr s  Other Topics Concern   Not on file  Social History Narrative   Not on file   Social Determinants of Health   Financial Resource Strain: Low Risk  (03/04/2022)   Overall Financial Resource Strain (CARDIA)    Difficulty of Paying Living Expenses: Not hard at all  Food Insecurity: No Food Insecurity (03/04/2022)   Hunger Vital Sign    Worried About Running Out of Food in the Last Year: Never true    [place] in the Last Year: Never true  Transportation Needs: No Transportation Needs (03/04/2022)   PRAPARE - Hydrologist (Medical): No    Lack of Transportation (Non-Medical): No  Physical Activity: Sufficiently Active (03/04/2022)   Exercise Vital Sign    Days of Exercise  per Week: 5 days    Minutes of Exercise per Session: 90 min  Stress: No Stress Concern Present (03/04/2022)   [place]    Feeling of Stress : Only a little  Social Connections: Moderately Isolated (03/04/2022)   Social Connection and Isolation Panel [NHANES]    Frequency of Communication with  Friends and Family: More than three times a week    Frequency of Social Gatherings with Friends and Family: Once a week    Attends Religious Services: Never    Marine scientist or Organizations: No    Attends Music therapist: Never    Marital Status: Married    Tobacco Counseling Counseling given: Not Answered Tobacco comments: Quit 1991   Clinical Intake:  Pre-visit preparation completed: Yes  Pain : No/denies pain     Nutritional Risks: None Diabetes: No  How often do you need to have someone help you when you read instructions, pamphlets, or other written materials from your doctor or pharmacy?: 1 - Never  Diabetic?no  Interpreter Needed?: No  Information entered by :: Kirke Shaggy, LPN   Activities of Daily Living    03/04/2022    9:08 AM  In your present state of health, do you have any difficulty performing the following activities:  Hearing? 0  Vision? 0  Difficulty concentrating or making decisions? 0  Walking or climbing stairs? 0  Dressing or bathing? 0  Doing errands, shopping? 0  Preparing Food and eating ? N  Using the Toilet? N  In the past six months, have you accidently leaked urine? N  Do you have problems with loss of bowel control? N  Managing your Medications? N  Managing your Finances? N  Housekeeping or managing your Housekeeping? N    Patient Care Team: Jinny Sanders, MD as PCP - General (Family Medicine)  Indicate any recent Medical Services you may have received from other than [facility] providers in the past year (date may be approximate).      Assessment:   This is a routine wellness examination for Angelys.  Hearing/Vision screen Hearing Screening - Comments:: No aids Vision Screening - Comments:: Wears glasses- [facility]  Dietary issues and exercise activities discussed: Current Exercise Habits: Home exercise routine, Type of exercise: walking, Time (Minutes): 60, Frequency (Times/Week): 5, Weekly Exercise (Minutes/Week): 300, Intensity: Mild   Goals Addressed             This Visit's Progress    DIET - EAT MORE FRUITS AND VEGETABLES         Depression Screen    03/04/2022    9:06 AM 12/23/2021   11:28 AM 09/05/2021   10:26 AM 08/08/2021   10:14 AM 01/25/2021    9:24 AM 01/23/2020    9:59 AM 01/20/2019    2:13 PM  PHQ 2/9 Scores  PHQ - 2 Score 0 0 2 3 0 0 0  PHQ- 9 Score 0  5 11  0 1    Fall Risk    03/04/2022    9:08 AM 12/23/2021   11:28 AM 01/25/2021    9:25 AM 01/23/2020    9:58 AM 01/20/2019    2:13 PM  Fall Risk   Falls in the past year? 0 0 1 1 0  Comment    fell off of ladder   Number falls in past yr: 0  0 0   Injury with Fall? 0  0 0   Risk for fall due to : No Fall Risks   No Fall Risks Impaired balance/gait  Risk for fall due to: Comment     when in pain  Follow up Falls prevention discussed;Falls evaluation completed Falls evaluation completed  Falls evaluation completed;Falls prevention discussed Falls evaluation completed;Falls prevention discussed    FALL RISK PREVENTION PERTAINING TO THE HOME:  Any stairs in or around the home? Yes  If so, are there any without handrails? No  Home free of loose throw rugs in walkways, pet beds, electrical cords, etc? Yes  Adequate lighting in your home to reduce risk of falls? Yes   ASSISTIVE DEVICES UTILIZED TO PREVENT FALLS:  Life alert? No  Use of a cane, walker or w/c? No  Grab bars in the bathroom? No  Shower chair or bench in shower? Yes  Elevated toilet seat or a handicapped toilet? No   Cognitive Function:    01/23/2020   10:02 AM  01/20/2019    2:17 PM 01/12/2018    8:06 AM  MMSE - Mini Mental State Exam  Orientation to time '5 5 5  ' Orientation to Place '5 5 5  ' Registration '3 3 3  ' Attention/ Calculation 5 5 0  Recall '3 3 3  ' Language- name 2 objects  0 0  Language- repeat '1 1 1  ' Language- follow 3 step command  0 3  Language- read & follow direction  0 0  Write a sentence  0 0  Copy design  0 0  Total score  22 20        03/04/2022    9:09 AM 01/25/2021    9:27 AM  6CIT Screen  What Year? 0 points 0 points  What month? 0 points 0 points  What time? 0 points 0 points  Count back from 20 0 points 0 points  Months in reverse 0 points 0 points  Repeat phrase 0 points 0 points  Total Score 0 points 0 points    Immunizations Immunization History  Administered Date(s) Administered   Fluad Quad(high Dose 65+) 01/24/2019, 01/01/2020, 01/16/2021, 01/11/2022   Influenza Split 03/08/2012, 02/11/2014, 02/09/2015   Influenza Whole 04/05/2011   Influenza, High Dose Seasonal PF 02/25/2017, 01/11/2018   Influenza,inj,Quad PF,6+ Mos 03/13/2013   Influenza-Unspecified 01/30/2016   PFIZER Comirnaty(Gray Top)Covid-19 Tri-Sucrose Vaccine 02/01/2022   PFIZER(Purple Top)SARS-COV-2 Vaccination 06/17/2019, 07/12/2019, 03/09/2020   Pfizer Covid-19 Vaccine Bivalent Booster 75yr & up 08/13/2020, 08/30/2021   Pneumococcal Conjugate-13 01/08/2017   Pneumococcal Polysaccharide-23 03/17/2018   Tdap 11/09/2010   Zoster Recombinat (Shingrix) 02/07/2020, 06/04/2020   Zoster, Live 03/08/2012    TDAP status: Due, Education has been provided regarding the importance of this vaccine. Advised may receive this vaccine at local pharmacy or Health Dept. Aware to provide a copy of the vaccination record if obtained from local pharmacy or Health Dept. Verbalized acceptance and understanding.  Flu Vaccine status: Up to date  Pneumococcal vaccine status: Up to date  Covid-19 vaccine status: Completed vaccines  Qualifies for Shingles  Vaccine? Yes   Zostavax completed Yes   Shingrix Completed?: Yes  Screening Tests Health Maintenance  Topic Date Due   TETANUS/TDAP  11/08/2020   MAMMOGRAM  02/21/2023   DEXA SCAN  03/11/2023   Medicare Annual Wellness (AWV)  04/04/2023   COLONOSCOPY (Pts 45-450yrInsurance coverage will need to be confirmed)  12/09/2024   Pneumonia Vaccine 6531Years old  Completed   INFLUENZA VACCINE  Completed   COVID-19 Vaccine  Completed   Hepatitis C Screening  Completed   Zoster Vaccines- Shingrix  Completed   HPV VACCINES  Aged Out    Health Maintenance  Health Maintenance Due  Topic Date Due   TETANUS/TDAP  11/08/2020    Colorectal cancer screening: Type of screening: Colonoscopy. Completed 12/09/21. Repeat every 3 years  Mammogram status: Completed 02/20/22. Repeat every year  Bone Density  status: Completed 03/10/18. Results reflect: Bone density results: NORMAL. Repeat every 5 years.  Lung Cancer Screening: (Low Dose CT Chest recommended if Age 31-80 years, 30 pack-year currently smoking OR have quit w/in 15years.) does not qualify.   Additional Screening:  Hepatitis C Screening: does qualify; Completed 08/08/15  Vision Screening: Recommended annual ophthalmology exams for early detection of glaucoma and other disorders of the eye. Is the patient up to date with their annual eye exam?  Yes  Who is the provider or what is the name of the office in which the patient attends annual eye exams? [place] If pt is not established with a provider, would they like to be referred to a provider to establish care? No .   Dental Screening: Recommended annual dental exams for proper oral hygiene  Community Resource Referral / Chronic Care Management: CRR required this visit?  No   CCM required this visit?  No      Plan:     I have personally reviewed and noted the following in the patient's chart:   Medical and social history Use of alcohol, tobacco or illicit drugs  Current  medications and supplements including opioid prescriptions. Patient is not currently taking opioid prescriptions. Functional ability and status Nutritional status Physical activity Advanced directives List of other physicians Hospitalizations, surgeries, and ER visits in previous 12 months Vitals Screenings to include cognitive, depression, and falls Referrals and appointments  In addition, I have reviewed and discussed with patient certain preventive protocols, quality metrics, and best practice recommendations. A written personalized care plan for preventive services as well as general preventive health recommendations were provided to patient.     Dionisio David, LPN   [id number]/33/[id number]   Nurse Notes: none

## 2022-03-05 DIAGNOSIS — M5136 Other intervertebral disc degeneration, lumbar region: Secondary | ICD-10-CM | POA: Diagnosis not present

## 2022-03-05 DIAGNOSIS — M9903 Segmental and somatic dysfunction of lumbar region: Secondary | ICD-10-CM | POA: Diagnosis not present

## 2022-03-05 DIAGNOSIS — M9905 Segmental and somatic dysfunction of pelvic region: Secondary | ICD-10-CM | POA: Diagnosis not present

## 2022-03-05 DIAGNOSIS — M955 Acquired deformity of pelvis: Secondary | ICD-10-CM | POA: Diagnosis not present

## 2022-03-12 DIAGNOSIS — M955 Acquired deformity of pelvis: Secondary | ICD-10-CM | POA: Diagnosis not present

## 2022-03-12 DIAGNOSIS — M9905 Segmental and somatic dysfunction of pelvic region: Secondary | ICD-10-CM | POA: Diagnosis not present

## 2022-03-12 DIAGNOSIS — M9903 Segmental and somatic dysfunction of lumbar region: Secondary | ICD-10-CM | POA: Diagnosis not present

## 2022-03-12 DIAGNOSIS — M5136 Other intervertebral disc degeneration, lumbar region: Secondary | ICD-10-CM | POA: Diagnosis not present

## 2022-03-19 DIAGNOSIS — M9905 Segmental and somatic dysfunction of pelvic region: Secondary | ICD-10-CM | POA: Diagnosis not present

## 2022-03-19 DIAGNOSIS — M5136 Other intervertebral disc degeneration, lumbar region: Secondary | ICD-10-CM | POA: Diagnosis not present

## 2022-03-19 DIAGNOSIS — M955 Acquired deformity of pelvis: Secondary | ICD-10-CM | POA: Diagnosis not present

## 2022-03-19 DIAGNOSIS — M9903 Segmental and somatic dysfunction of lumbar region: Secondary | ICD-10-CM | POA: Diagnosis not present

## 2022-03-26 DIAGNOSIS — M5136 Other intervertebral disc degeneration, lumbar region: Secondary | ICD-10-CM | POA: Diagnosis not present

## 2022-03-26 DIAGNOSIS — M9905 Segmental and somatic dysfunction of pelvic region: Secondary | ICD-10-CM | POA: Diagnosis not present

## 2022-03-26 DIAGNOSIS — M9903 Segmental and somatic dysfunction of lumbar region: Secondary | ICD-10-CM | POA: Diagnosis not present

## 2022-03-26 DIAGNOSIS — M955 Acquired deformity of pelvis: Secondary | ICD-10-CM | POA: Diagnosis not present

## 2022-04-02 ENCOUNTER — Other Ambulatory Visit: Payer: Medicare Other

## 2022-04-03 IMAGING — MG DIGITAL DIAGNOSTIC BILAT W/ TOMO W/ CAD
6 of 10 series · 6 of 30 positions shown · non-contrast
Comparison: Prior exams including 03/25/2020 breast MR, 09/27/2019
mammograms and prior studies

CLINICAL DATA: 69-year-old female with palpable UPPER OUTER RIGHT
breast mass discovered on clinical examination, and for annual
bilateral mammogram. Patient states that she notices possible mild
RIGHT nipple inversion. History of OO3ZJ gene mutation.

EXAM:
DIGITAL DIAGNOSTIC BILATERAL MAMMOGRAM WITH TOMOSYNTHESIS AND CAD;
ULTRASOUND RIGHT BREAST LIMITED
TECHNIQUE: Bilateral digital diagnostic mammography and breast tomosynthesis
was performed. The images were evaluated with computer-aided
detection.; Targeted ultrasound examination of the right breast was
performed

[R MLO synth-2D (1 of 2)]
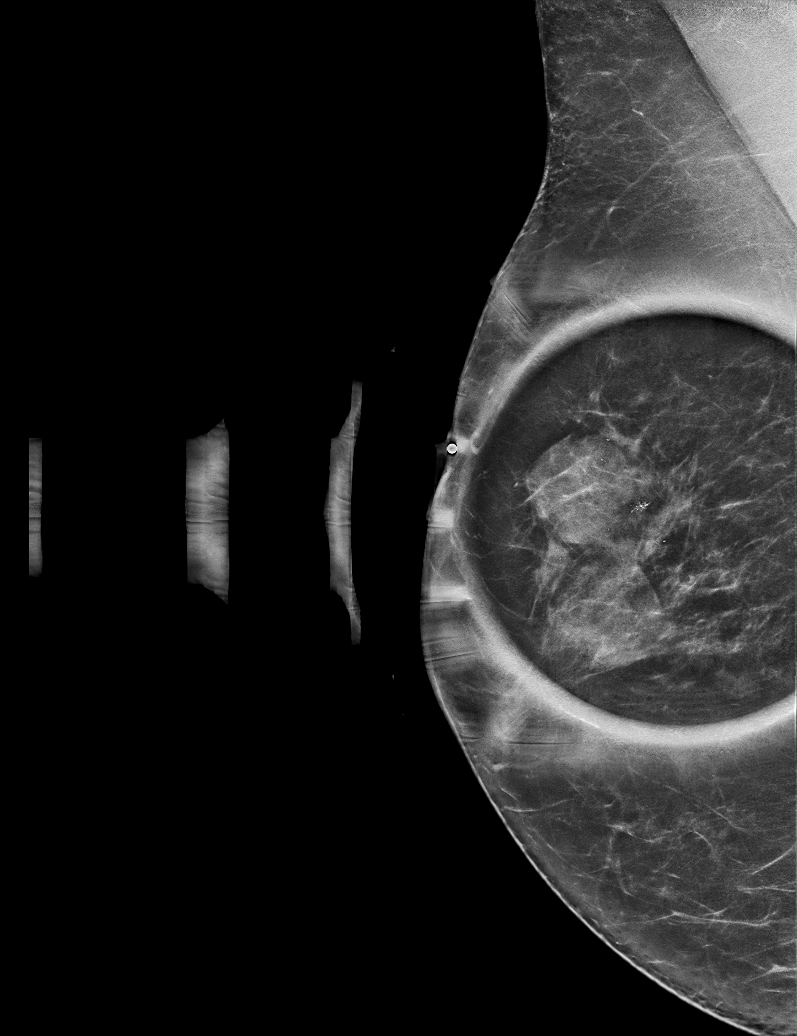

[L CC synth-2D]
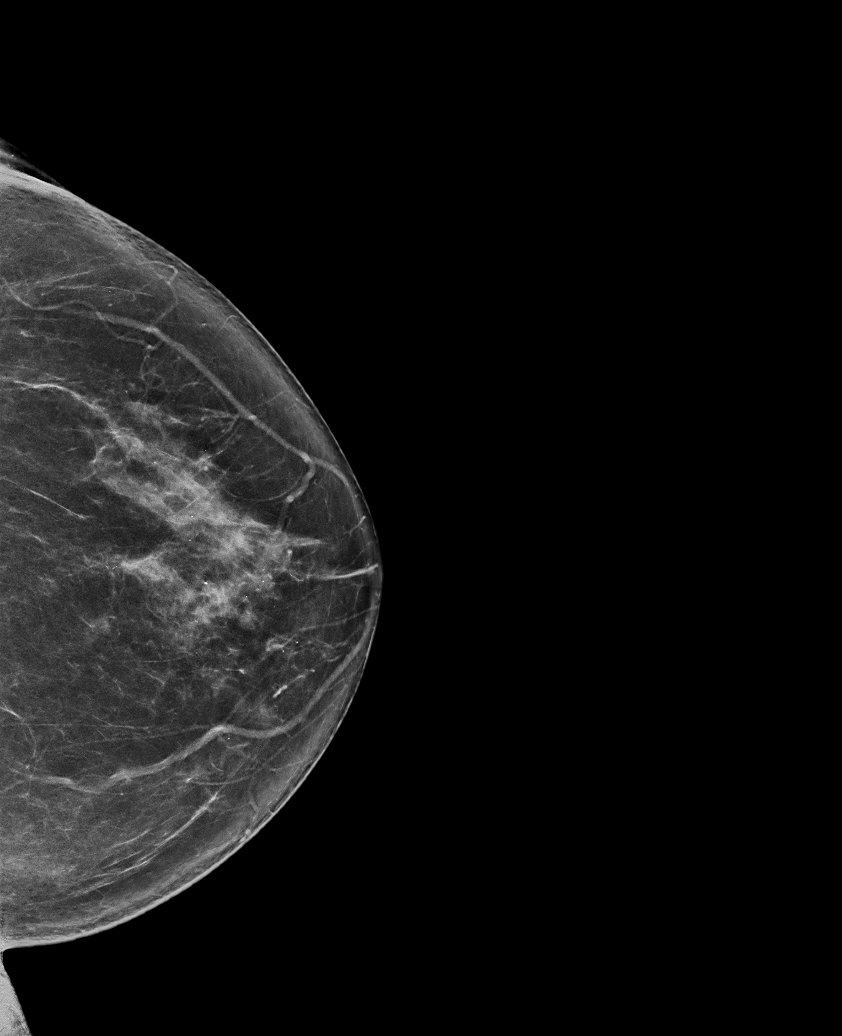

[L MLO synth-2D]
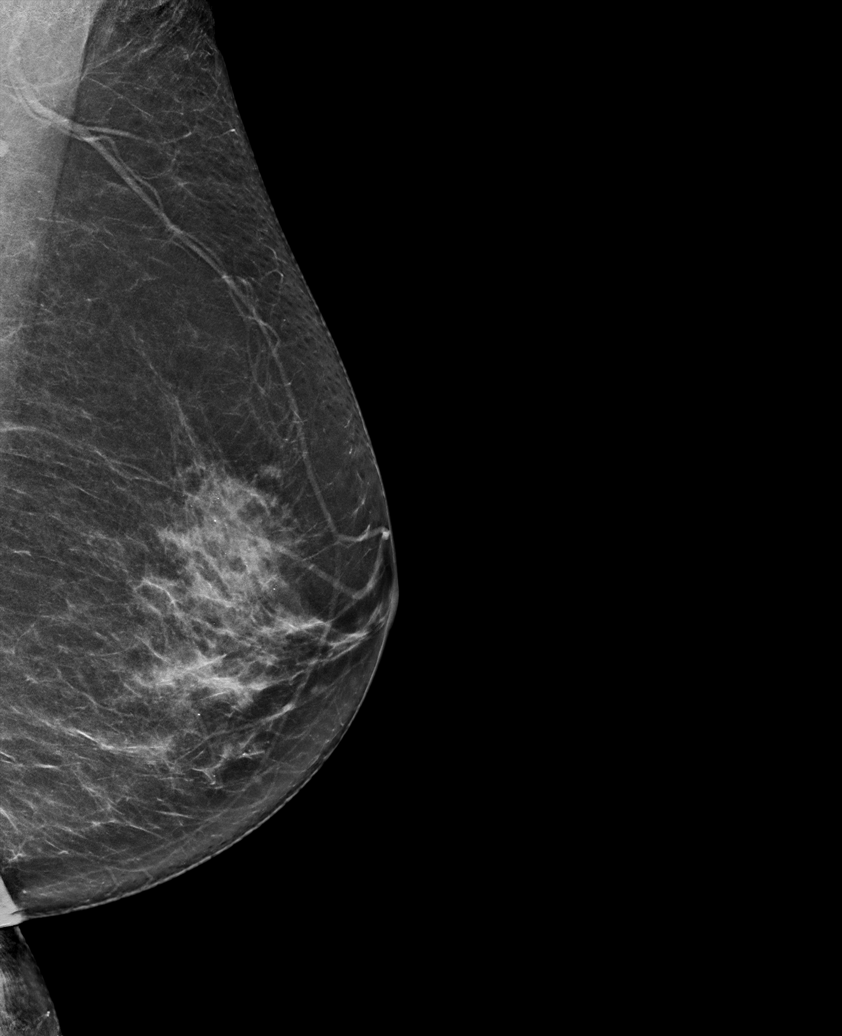

[R CC synth-2D]
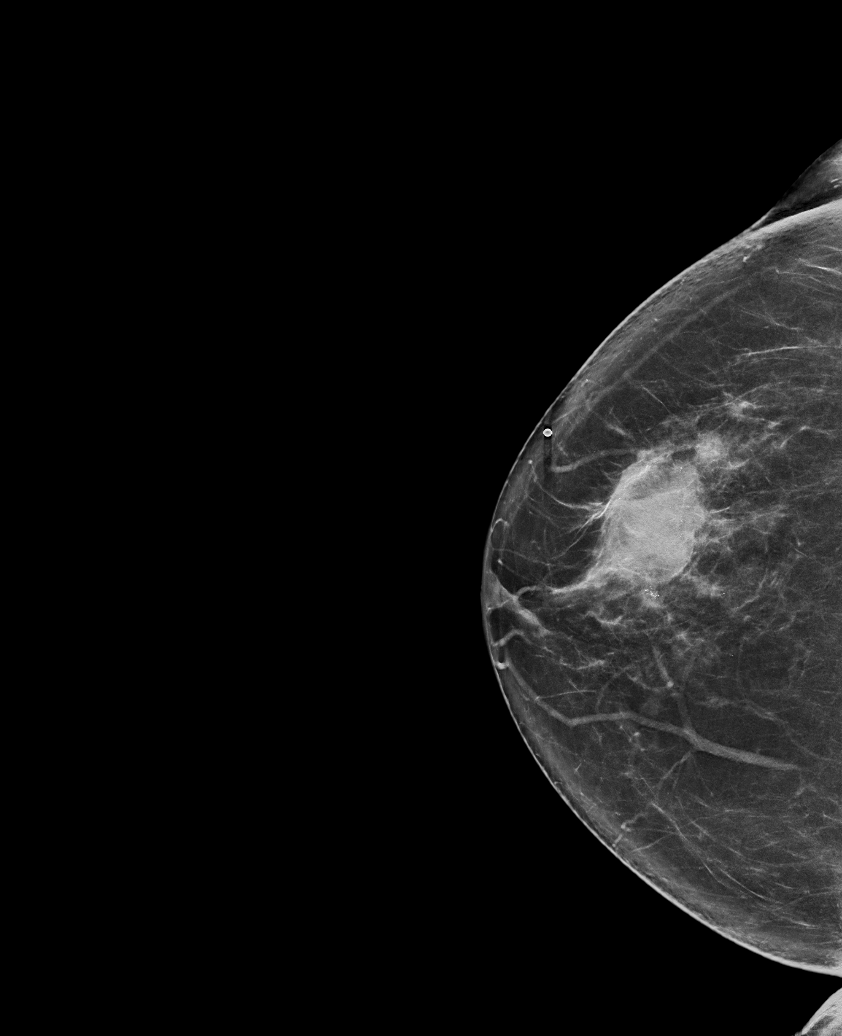

[R MLO synth-2D (2 of 2)]
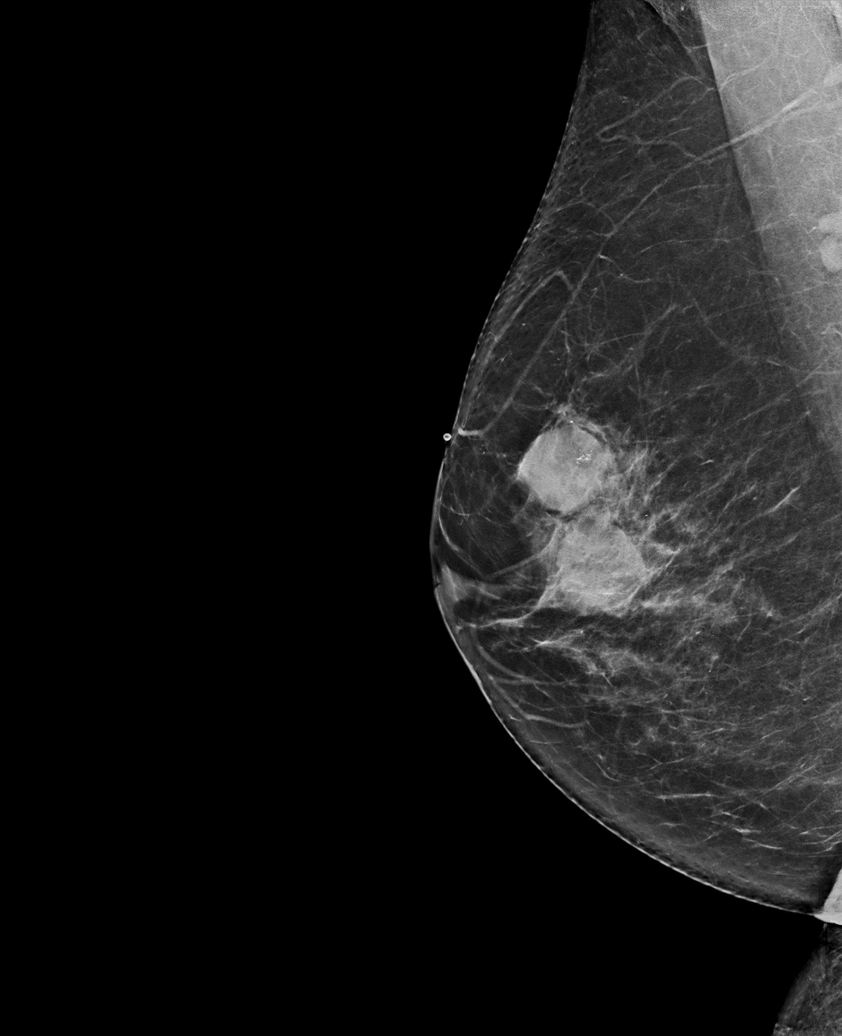

[R MLO tomo · tomo slice 41/80.0]
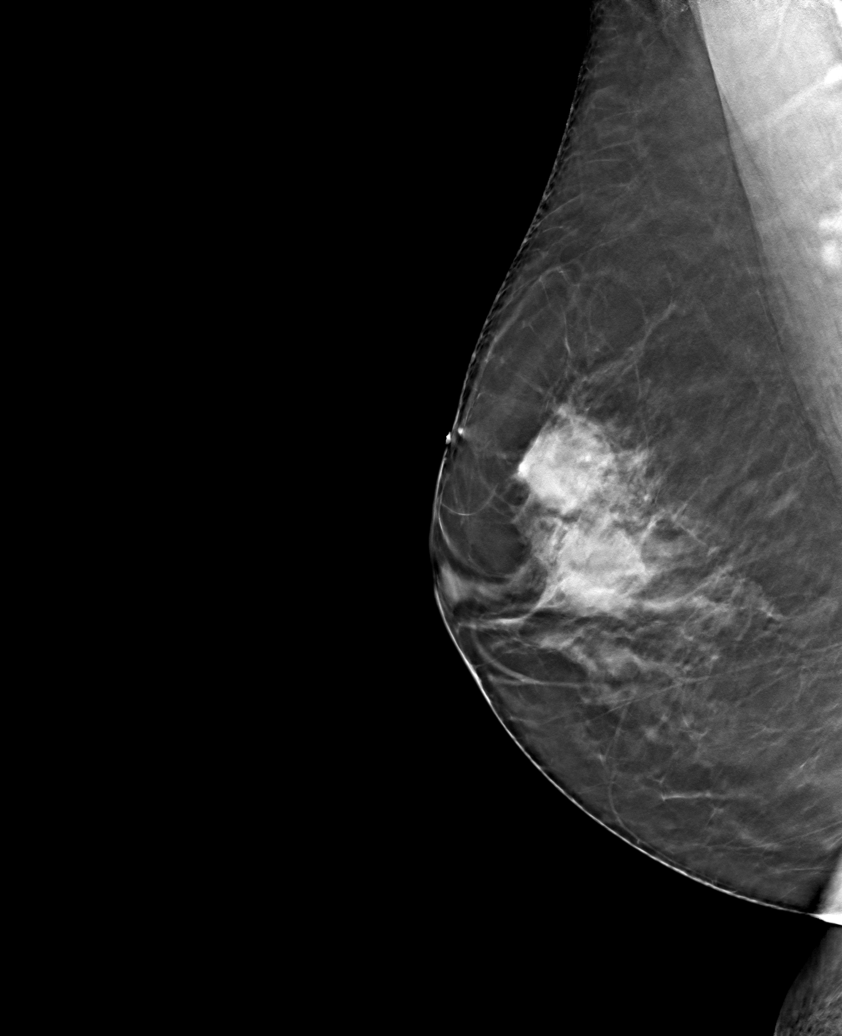

[6 of 30 positions shown; findings below may reference images not displayed]

ACR Breast Density Category b: There are scattered areas of
fibroglandular density.
FINDINGS: 2D/3D full field views of both breasts and spot compression view of
the RIGHT breast demonstrate 2 adjacent circumscribed oval masses
within the UPPER-OUTER RIGHT breast, decreased in size since prior
studies.

No new or suspicious findings within either breast noted.

On physical exam, the RIGHT nipple may be slightly inverted as
compared to the LEFT. No palpable abnormalities are noted in the
RETROAREOLAR region.

Targeted ultrasound is performed, showing 2 adjacent benign cysts at
the 10 o'clock position of the RIGHT breast 2 cm from the nipple,
measuring 2 x 1.7 x 2.1 cm and 2.3 x 1.9 x 2 cm, and accounting for
the palpable abnormality on clinical examination.

No other abnormalities within the RIGHT breast noted. No sonographic
abnormalities identified in the RETROAREOLAR RIGHT breast.
IMPRESSION: 1. Two adjacent benign cysts within the UPPER-OUTER RIGHT breast
corresponding to the palpable mass identified on clinical
examination.
2. Mild RIGHT nipple inversion as compared to the LEFT without
suspicious RIGHT breast abnormality. This may be related to
decreasing size of the cysts within the RIGHT breast. If increased
RIGHT nipple inversion is noted in the next few months, than MRI is
recommended before screening interval.
3. No mammographic evidence of LEFT breast malignancy.

RECOMMENDATION:
1. Bilateral screening breast MRI in March 2021 to resume annual
screening breast MRI schedule in this patient with OO3ZJ gene
mutation.
2. Bilateral screening mammogram in 1 year to resume annual
mammogram schedule.
3. Consider clinical follow-up of mild RIGHT nipple inversion as
indicated. Any further workup should be based on clinical grounds.

I have discussed the findings and recommendations with the patient.
If applicable, a reminder letter will be sent to the patient
regarding the next appointment.

BI-RADS CATEGORY  2: Benign.

## 2022-04-06 ENCOUNTER — Other Ambulatory Visit: Payer: Medicare Other

## 2022-04-06 DIAGNOSIS — M9905 Segmental and somatic dysfunction of pelvic region: Secondary | ICD-10-CM | POA: Diagnosis not present

## 2022-04-06 DIAGNOSIS — M5136 Other intervertebral disc degeneration, lumbar region: Secondary | ICD-10-CM | POA: Diagnosis not present

## 2022-04-06 DIAGNOSIS — M9903 Segmental and somatic dysfunction of lumbar region: Secondary | ICD-10-CM | POA: Diagnosis not present

## 2022-04-06 DIAGNOSIS — M955 Acquired deformity of pelvis: Secondary | ICD-10-CM | POA: Diagnosis not present

## 2022-04-13 ENCOUNTER — Telehealth: Payer: Self-pay | Admitting: Family Medicine

## 2022-04-13 DIAGNOSIS — E7800 Pure hypercholesterolemia, unspecified: Secondary | ICD-10-CM

## 2022-04-13 NOTE — Telephone Encounter (Signed)
-----   Message from Velna Hatchet, RT sent at 03/30/2022 12:23 PM EST ----- Regarding: Fri 12/8 lab Patient is scheduled for cpx, please order future labs.  Thanks, Anda Kraft

## 2022-04-17 ENCOUNTER — Other Ambulatory Visit (INDEPENDENT_AMBULATORY_CARE_PROVIDER_SITE_OTHER): Payer: Medicare Other

## 2022-04-17 DIAGNOSIS — E7800 Pure hypercholesterolemia, unspecified: Secondary | ICD-10-CM | POA: Diagnosis not present

## 2022-04-17 LAB — COMPREHENSIVE METABOLIC PANEL
ALT: 41 U/L — ABNORMAL HIGH (ref 0–35)
AST: 36 U/L (ref 0–37)
Albumin: 4.3 g/dL (ref 3.5–5.2)
Alkaline Phosphatase: 39 U/L (ref 39–117)
BUN: 14 mg/dL (ref 6–23)
CO2: 28 mEq/L (ref 19–32)
Calcium: 9.6 mg/dL (ref 8.4–10.5)
Chloride: 103 mEq/L (ref 96–112)
Creatinine, Ser: 1.04 mg/dL (ref 0.40–1.20)
GFR: 54.18 mL/min — ABNORMAL LOW (ref >60.00)
Glucose, Bld: 109 mg/dL — ABNORMAL HIGH (ref 70–99)
Potassium: 4.6 mEq/L (ref 3.5–5.1)
Sodium: 138 mEq/L (ref 135–145)
Total Bilirubin: 0.4 mg/dL (ref 0.2–1.2)
Total Protein: 6.7 g/dL (ref 6.0–8.3)

## 2022-04-17 LAB — LIPID PANEL
Cholesterol: 160 mg/dL (ref 0–200)
HDL: 58.4 mg/dL (ref >39.00)
LDL Cholesterol: 80 mg/dL (ref 0–99)
NonHDL: 101.52
Total CHOL/HDL Ratio: 3
Triglycerides: 108 mg/dL (ref 0.0–149.0)
VLDL: 21.6 mg/dL (ref 0.0–40.0)

## 2022-04-17 NOTE — Progress Notes (Signed)
No critical labs need to be addressed urgently. We will discuss labs in detail at upcoming office visit.   

## 2022-04-20 ENCOUNTER — Other Ambulatory Visit: Payer: Medicare Other

## 2022-04-21 NOTE — Progress Notes (Signed)
This encounter was created in error - please disregard.

## 2022-04-22 ENCOUNTER — Encounter: Payer: Self-pay | Admitting: Family Medicine

## 2022-04-22 ENCOUNTER — Ambulatory Visit (INDEPENDENT_AMBULATORY_CARE_PROVIDER_SITE_OTHER): Payer: Medicare Other | Admitting: Family Medicine

## 2022-04-22 VITALS — BP 116/70 | HR 75 | Temp 97.5°F | Ht 64.75 in | Wt 152.1 lb

## 2022-04-22 DIAGNOSIS — Z1502 Genetic susceptibility to malignant neoplasm of ovary: Secondary | ICD-10-CM

## 2022-04-22 DIAGNOSIS — Z1501 Genetic susceptibility to malignant neoplasm of breast: Secondary | ICD-10-CM

## 2022-04-22 DIAGNOSIS — Z1509 Genetic susceptibility to other malignant neoplasm: Secondary | ICD-10-CM

## 2022-04-22 DIAGNOSIS — Z1589 Genetic susceptibility to other disease: Secondary | ICD-10-CM | POA: Diagnosis not present

## 2022-04-22 DIAGNOSIS — N6091 Unspecified benign mammary dysplasia of right breast: Secondary | ICD-10-CM | POA: Diagnosis not present

## 2022-04-22 DIAGNOSIS — F411 Generalized anxiety disorder: Secondary | ICD-10-CM

## 2022-04-22 DIAGNOSIS — D0511 Intraductal carcinoma in situ of right breast: Secondary | ICD-10-CM | POA: Insufficient documentation

## 2022-04-22 DIAGNOSIS — R7303 Prediabetes: Secondary | ICD-10-CM

## 2022-04-22 DIAGNOSIS — Z0000 Encounter for general adult medical examination without abnormal findings: Secondary | ICD-10-CM

## 2022-04-22 LAB — POCT GLYCOSYLATED HEMOGLOBIN (HGB A1C): Hemoglobin A1C: 5.8 % — AB (ref 4.0–5.6)

## 2022-04-22 NOTE — Progress Notes (Signed)
Patient ID: Rachael Sharp, female    DOB: December 10, 1950, 71 y.o.   MRN: [id number]  This visit was conducted in person.  BP 116/70 (BP Location: Left Arm, Patient Position: Sitting)   Pulse 75   Temp (!) 97.5 F (36.4 C) (Skin)   Ht 5' 4.75" (1.645 m)   Wt 152 lb 2 oz (69 kg)   SpO2 97%   BMI 25.51 kg/m    CC:  Chief Complaint  Patient presents with   Annual Exam    Subjective:   HPI: Rachael Sharp is a 71 y.o. female presenting on 04/22/2022 for Annual Exam  The patient presents forreview of chronic health problems. He/She also has the following acute concerns today: none except pending breast eval.   Has appt with Dr. Barry Dienes for right breat:  atypical ductal tissue with a full excisional biopsy likely.  Reviewed labs in detail.   Cholesterol improved overall, she is not sure why.The 10-year ASCVD risk score (Arnett DK, et al., 2019) is: 8.2%   Values used to calculate the score:     Age: 73 years     Sex: Female     Is Non-Hispanic African American: No     Diabetic: No     Tobacco smoker: No     Systolic Blood Pressure: 409 mmHg     Is BP treated: No     HDL Cholesterol: 58.4 mg/dL     Total Cholesterol: 160 mg/dL  Diet: heart healthy Exercise: none Wt Readings from Last 3 Encounters:  04/22/22 152 lb 2 oz (69 kg)  03/04/22 151 lb (68.5 kg)  01/22/22 151 lb 6 oz (68.7 kg)     Prediabetes  Due Lab Results  Component Value Date   HGBA1C 6.1 01/22/2021   GAD well controlled on sertraline 25 mg daily.   She has been drinking more ETOH lately.. 2 per night.   Relevant past medical, surgical, family and social history reviewed and updated as indicated. Interim medical history since our last visit reviewed. Allergies and medications reviewed and updated. Outpatient Medications Prior to Visit  Medication Sig Dispense Refill   amitriptyline (ELAVIL) 10 MG tablet Take 3 tablets (30 mg total) by mouth at bedtime. 270 tablet 3   aspirin 81 MG EC  tablet Take 1 tablet by mouth daily.     BIOTIN PO Take 5,000 mcg by mouth daily.     CALCIUM PO Take 1 tablet by mouth in the morning and at bedtime.     Glucosamine HCl-MSM (GLUCOSAMINE-MSM PO) Take 1 tablet by mouth daily.     Multiple Vitamin (MULTIVITAMIN) tablet Take 2 tablets by mouth daily.      Omega-3 Fatty Acids (OMEGA 3 PO) Take 600 mg by mouth daily.      sertraline (ZOLOFT) 25 MG tablet TAKE 1 TABLET (25 MG TOTAL) BY MOUTH DAILY. 90 tablet 1   tretinoin (RETIN-A) 0.025 % cream Apply topically at bedtime.     No facility-administered medications prior to visit.     Per HPI unless specifically indicated in ROS section below Review of Systems  Constitutional:  Negative for fatigue and fever.  HENT:  Negative for congestion.   Eyes:  Negative for pain.  Respiratory:  Negative for cough and shortness of breath.   Cardiovascular:  Negative for chest pain, palpitations and leg swelling.  Gastrointestinal:  Negative for abdominal pain.  Genitourinary:  Negative for dysuria and vaginal bleeding.  Musculoskeletal:  Negative for back pain.  Neurological:  Negative for syncope, light-headedness and headaches.  Psychiatric/Behavioral:  Negative for dysphoric mood.    Objective:  BP 116/70 (BP Location: Left Arm, Patient Position: Sitting)   Pulse 75   Temp (!) 97.5 F (36.4 C) (Skin)   Ht 5' 4.75" (1.645 m)   Wt 152 lb 2 oz (69 kg)   SpO2 97%   BMI 25.51 kg/m   Wt Readings from Last 3 Encounters:  04/22/22 152 lb 2 oz (69 kg)  03/04/22 151 lb (68.5 kg)  01/22/22 151 lb 6 oz (68.7 kg)      Physical Exam Vitals and nursing note reviewed.  Constitutional:      General: She is not in acute distress.    Appearance: Normal appearance. She is well-developed. She is not ill-appearing or toxic-appearing.  HENT:     Head: Normocephalic.     Right Ear: Hearing, tympanic membrane, ear canal and external ear normal.     Left Ear: Hearing, tympanic membrane, ear canal and external  ear normal.     Nose: Nose normal.  Eyes:     General: Lids are normal. Lids are everted, no foreign bodies appreciated.     Conjunctiva/sclera: Conjunctivae normal.     Pupils: Pupils are equal, round, and reactive to light.  Neck:     Thyroid: No thyroid mass or thyromegaly.     Vascular: No carotid bruit.     Trachea: Trachea normal.  Cardiovascular:     Rate and Rhythm: Normal rate and regular rhythm.     Heart sounds: Normal heart sounds, S1 normal and S2 normal. No murmur heard.    No gallop.  Pulmonary:     Effort: Pulmonary effort is normal. No respiratory distress.     Breath sounds: Normal breath sounds. No wheezing, rhonchi or rales.  Abdominal:     General: Bowel sounds are normal. There is no distension or abdominal bruit.     Palpations: Abdomen is soft. There is no fluid wave or mass.     Tenderness: There is no abdominal tenderness. There is no guarding or rebound.     Hernia: No hernia is present.  Musculoskeletal:     Cervical back: Normal range of motion and neck supple.  Lymphadenopathy:     Cervical: No cervical adenopathy.  Skin:    General: Skin is warm and dry.     Findings: No rash.  Neurological:     Mental Status: She is alert.     Cranial Nerves: No cranial nerve deficit.     Sensory: No sensory deficit.  Psychiatric:        Mood and Affect: Mood is not anxious or depressed.        Speech: Speech normal.        Behavior: Behavior normal. Behavior is cooperative.        Judgment: Judgment normal.       Results for orders placed or performed in visit on 04/17/22  Comprehensive metabolic panel  Result Value Ref Range   Sodium 138 135 - 145 mEq/L   Potassium 4.6 3.5 - 5.1 mEq/L   Chloride 103 96 - 112 mEq/L   CO2 28 19 - 32 mEq/L   Glucose, Bld 109 (H) 70 - 99 mg/dL   BUN 14 6 - 23 mg/dL   Creatinine, Ser 1.04 0.40 - 1.20 mg/dL   Total Bilirubin 0.4 0.2 - 1.2 mg/dL   Alkaline Phosphatase 39 39 - 117 U/L   AST 36 0 -  37 U/L   ALT 41 (H) 0 -  35 U/L   Total Protein 6.7 6.0 - 8.3 g/dL   Albumin 4.3 3.5 - 5.2 g/dL   GFR 54.18 (L) >60.00 mL/min   Calcium 9.6 8.4 - 10.5 mg/dL  Lipid panel  Result Value Ref Range   Cholesterol 160 0 - 200 mg/dL   Triglycerides 108.0 0.0 - 149.0 mg/dL   HDL 58.40 >39.00 mg/dL   VLDL 21.6 0.0 - 40.0 mg/dL   LDL Cholesterol 80 0 - 99 mg/dL   Total CHOL/HDL Ratio 3    NonHDL 101.52      COVID 19 screen:  No recent travel or known exposure to COVID19 The patient denies respiratory symptoms of COVID 19 at this time. The importance of social distancing was discussed today.   Assessment and Plan   The patient's preventative maintenance and recommended screening tests for an annual wellness exam were reviewed in full today. Brought up to date unless services declined.  Counselled on the importance of diet, exercise, and its role in overall health and mortality. The patient's FH and SH was reviewed, including their home life, tobacco status, and drug and alcohol status.   Colonoscopy every 3 years. Dr Hilarie Fredrickson, last 12/2021 given CHEK2 mutation PAP/DVE not indicated,  TAH.  Vaccines:uptodate, flu done, S/P COVID19 x3 and PNA series. S/P shingirx,   Flu  Hep C: done  Mammogram: Chek 2 mutation. Annual mammogram with breast MRI Needs breast exam twice a year..  Bone density: 02/2018 normal, repeat in 5 years.  Problem List Items Addressed This Visit     Atypical ductal hyperplasia of right breast    Recent nipple changes in setting of CHEK2 mutation resulted in biopsy of right breast.  Atypical ductal hyperplasia found.  She now has referral to breast surgeon for excisional biopsy.  We discussed pending this visit we will consider referral to oncologist for further recommendations.      GAD (generalized anxiety disorder)    Stable, chronic.  Continue current medication.  well controlled on sertraline 25 mg daily.      Monoallelic mutation of CHEK2 gene in female patient    Increased risk for  breast cancer and colon cancer.      Prediabetes    Stable control with diet.       Relevant Orders   POCT glycosylated hemoglobin (Hb A1C) (Completed)   Other Visit Diagnoses     Routine general medical examination at a health care facility    -  Primary       Eliezer Lofts, MD

## 2022-04-22 NOTE — Assessment & Plan Note (Signed)
Stable control with diet.

## 2022-04-22 NOTE — Patient Instructions (Addendum)
Decrease alcohol intake to no more than 1 drink a day. Keep appt as planned with breast surgeon.. call if you need a referral to oncology.  Get back to regular exercise.

## 2022-04-22 NOTE — Assessment & Plan Note (Signed)
Increased risk for breast cancer and colon cancer.

## 2022-04-22 NOTE — Assessment & Plan Note (Signed)
Recent nipple changes in setting of CHEK2 mutation resulted in biopsy of right breast.  Atypical ductal hyperplasia found.  She now has referral to breast surgeon for excisional biopsy.  We discussed pending this visit we will consider referral to oncologist for further recommendations. 

## 2022-04-22 NOTE — Assessment & Plan Note (Signed)
Stable, chronic.  Continue current medication.  well controlled on sertraline 25 mg daily.

## 2022-04-24 ENCOUNTER — Encounter: Payer: Medicare Other | Admitting: Family Medicine

## 2022-04-24 ENCOUNTER — Other Ambulatory Visit: Payer: Self-pay | Admitting: General Surgery

## 2022-04-24 DIAGNOSIS — Z1502 Genetic susceptibility to malignant neoplasm of ovary: Secondary | ICD-10-CM | POA: Diagnosis not present

## 2022-04-24 DIAGNOSIS — N6459 Other signs and symptoms in breast: Secondary | ICD-10-CM | POA: Diagnosis not present

## 2022-04-24 DIAGNOSIS — Z1589 Genetic susceptibility to other disease: Secondary | ICD-10-CM | POA: Diagnosis not present

## 2022-04-24 DIAGNOSIS — Z1509 Genetic susceptibility to other malignant neoplasm: Secondary | ICD-10-CM | POA: Diagnosis not present

## 2022-04-24 DIAGNOSIS — D241 Benign neoplasm of right breast: Secondary | ICD-10-CM

## 2022-04-24 DIAGNOSIS — N6091 Unspecified benign mammary dysplasia of right breast: Secondary | ICD-10-CM | POA: Diagnosis not present

## 2022-04-24 DIAGNOSIS — Z803 Family history of malignant neoplasm of breast: Secondary | ICD-10-CM | POA: Diagnosis not present

## 2022-04-24 DIAGNOSIS — Z1501 Genetic susceptibility to malignant neoplasm of breast: Secondary | ICD-10-CM | POA: Diagnosis not present

## 2022-04-29 ENCOUNTER — Other Ambulatory Visit: Payer: Self-pay | Admitting: General Surgery

## 2022-04-29 DIAGNOSIS — D241 Benign neoplasm of right breast: Secondary | ICD-10-CM

## 2022-05-09 ENCOUNTER — Other Ambulatory Visit: Payer: Self-pay | Admitting: Family Medicine

## 2022-05-11 HISTORY — PX: BREAST LUMPECTOMY: SHX2

## 2022-05-13 NOTE — Telephone Encounter (Signed)
Refill request Zoloft, see drug warning with Amitriptyline Last office visit 04/21/22 Last refill 11/23/21 #90/1

## 2022-05-15 DIAGNOSIS — M955 Acquired deformity of pelvis: Secondary | ICD-10-CM | POA: Diagnosis not present

## 2022-05-15 DIAGNOSIS — M5136 Other intervertebral disc degeneration, lumbar region: Secondary | ICD-10-CM | POA: Diagnosis not present

## 2022-05-15 DIAGNOSIS — M9903 Segmental and somatic dysfunction of lumbar region: Secondary | ICD-10-CM | POA: Diagnosis not present

## 2022-05-15 DIAGNOSIS — M9905 Segmental and somatic dysfunction of pelvic region: Secondary | ICD-10-CM | POA: Diagnosis not present

## 2022-05-19 ENCOUNTER — Encounter (HOSPITAL_BASED_OUTPATIENT_CLINIC_OR_DEPARTMENT_OTHER): Payer: Self-pay | Admitting: General Surgery

## 2022-05-19 ENCOUNTER — Other Ambulatory Visit: Payer: Self-pay

## 2022-05-21 ENCOUNTER — Ambulatory Visit
Admission: RE | Admit: 2022-05-21 | Discharge: 2022-05-21 | Disposition: A | Discharge status: Home / Self Care | Payer: Medicare Other | Source: Ambulatory Visit | Attending: General Surgery | Admitting: General Surgery

## 2022-05-21 DIAGNOSIS — R928 Other abnormal and inconclusive findings on diagnostic imaging of breast: Secondary | ICD-10-CM | POA: Diagnosis not present

## 2022-05-21 DIAGNOSIS — M955 Acquired deformity of pelvis: Secondary | ICD-10-CM | POA: Diagnosis not present

## 2022-05-21 DIAGNOSIS — M9903 Segmental and somatic dysfunction of lumbar region: Secondary | ICD-10-CM | POA: Diagnosis not present

## 2022-05-21 DIAGNOSIS — M5136 Other intervertebral disc degeneration, lumbar region: Secondary | ICD-10-CM | POA: Diagnosis not present

## 2022-05-21 DIAGNOSIS — M9905 Segmental and somatic dysfunction of pelvic region: Secondary | ICD-10-CM | POA: Diagnosis not present

## 2022-05-21 DIAGNOSIS — D241 Benign neoplasm of right breast: Secondary | ICD-10-CM

## 2022-05-21 HISTORY — PX: BREAST BIOPSY: SHX20

## 2022-05-21 MED ORDER — CHLORHEXIDINE GLUCONATE CLOTH 2 % EX PADS: 6.0000 | MEDICATED_PAD | Freq: Once | CUTANEOUS | Status: DC

## 2022-05-21 NOTE — Progress Notes (Signed)

## 2022-05-25 NOTE — Anesthesia Preprocedure Evaluation (Signed)
Anesthesia Evaluation  Patient identified by MRN, date of birth, ID band Patient awake    Reviewed: Allergy & Precautions, NPO status , Patient's Chart, lab work & pertinent test results  Airway Mallampati: I  TM Distance: >3 FB Neck ROM: Full    Dental no notable dental hx.    Pulmonary former smoker   Pulmonary exam normal        Cardiovascular Normal cardiovascular exam+ Valvular Problems/Murmurs MVP      Neuro/Psych  PSYCHIATRIC DISORDERS Anxiety     negative neurological ROS     GI/Hepatic negative GI ROS, Neg liver ROS,,,  Endo/Other  negative endocrine ROS    Renal/GU negative Renal ROS     Musculoskeletal  (+) Arthritis ,    Abdominal   Peds  Hematology negative hematology ROS (+)   Anesthesia Other Findings RIGHT BREAST PAPILLOMA  Reproductive/Obstetrics                             Anesthesia Physical Anesthesia Plan  ASA: 2  Anesthesia Plan: General   Post-op Pain Management:    Induction: Intravenous  PONV Risk Score and Plan: 3 and Ondansetron, Dexamethasone, Midazolam and Treatment may vary due to age or medical condition  Airway Management Planned: LMA  Additional Equipment:   Intra-op Plan:   Post-operative Plan: Extubation in OR  Informed Consent: I have reviewed the patients History and Physical, chart, labs and discussed the procedure including the risks, benefits and alternatives for the proposed anesthesia with the patient or authorized representative who has indicated his/her understanding and acceptance.     Dental advisory given  Plan Discussed with: CRNA  Anesthesia Plan Comments:        Anesthesia Quick Evaluation

## 2022-05-26 ENCOUNTER — Ambulatory Visit (HOSPITAL_BASED_OUTPATIENT_CLINIC_OR_DEPARTMENT_OTHER): Payer: Medicare Other | Admitting: Anesthesiology

## 2022-05-26 ENCOUNTER — Ambulatory Visit
Admit: 2022-05-26 | Discharge: 2022-05-26 | Disposition: A | Discharge status: Home / Self Care | Payer: Medicare Other | Attending: General Surgery | Admitting: General Surgery

## 2022-05-26 ENCOUNTER — Other Ambulatory Visit: Payer: Self-pay

## 2022-05-26 ENCOUNTER — Ambulatory Visit (HOSPITAL_COMMUNITY)
Admission: RE | Admit: 2022-05-26 | Discharge: 2022-05-26 | Disposition: A | Discharge status: Home / Self Care | Payer: Medicare Other | Attending: General Surgery | Admitting: General Surgery

## 2022-05-26 ENCOUNTER — Encounter (HOSPITAL_BASED_OUTPATIENT_CLINIC_OR_DEPARTMENT_OTHER)
Admission: RE | Disposition: A | Discharge status: Home / Self Care | Payer: Self-pay | Source: Home / Self Care | Attending: General Surgery

## 2022-05-26 ENCOUNTER — Encounter (HOSPITAL_BASED_OUTPATIENT_CLINIC_OR_DEPARTMENT_OTHER): Payer: Self-pay | Admitting: General Surgery

## 2022-05-26 DIAGNOSIS — D241 Benign neoplasm of right breast: Secondary | ICD-10-CM | POA: Insufficient documentation

## 2022-05-26 DIAGNOSIS — D0511 Intraductal carcinoma in situ of right breast: Secondary | ICD-10-CM | POA: Insufficient documentation

## 2022-05-26 DIAGNOSIS — Z79899 Other long term (current) drug therapy: Secondary | ICD-10-CM | POA: Diagnosis not present

## 2022-05-26 DIAGNOSIS — I341 Nonrheumatic mitral (valve) prolapse: Secondary | ICD-10-CM | POA: Diagnosis not present

## 2022-05-26 DIAGNOSIS — C50919 Malignant neoplasm of unspecified site of unspecified female breast: Secondary | ICD-10-CM

## 2022-05-26 DIAGNOSIS — Z803 Family history of malignant neoplasm of breast: Secondary | ICD-10-CM | POA: Insufficient documentation

## 2022-05-26 DIAGNOSIS — F419 Anxiety disorder, unspecified: Secondary | ICD-10-CM | POA: Diagnosis not present

## 2022-05-26 DIAGNOSIS — M1990 Unspecified osteoarthritis, unspecified site: Secondary | ICD-10-CM | POA: Insufficient documentation

## 2022-05-26 DIAGNOSIS — Z01818 Encounter for other preprocedural examination: Secondary | ICD-10-CM

## 2022-05-26 DIAGNOSIS — Z1501 Genetic susceptibility to malignant neoplasm of breast: Secondary | ICD-10-CM | POA: Insufficient documentation

## 2022-05-26 DIAGNOSIS — R928 Other abnormal and inconclusive findings on diagnostic imaging of breast: Secondary | ICD-10-CM

## 2022-05-26 DIAGNOSIS — Z87891 Personal history of nicotine dependence: Secondary | ICD-10-CM | POA: Insufficient documentation

## 2022-05-26 DIAGNOSIS — Z1509 Genetic susceptibility to other malignant neoplasm: Secondary | ICD-10-CM | POA: Insufficient documentation

## 2022-05-26 HISTORY — PX: RADIOACTIVE SEED GUIDED EXCISIONAL BREAST BIOPSY: SHX6490

## 2022-05-26 HISTORY — DX: Malignant neoplasm of unspecified site of unspecified female breast: C50.919

## 2022-05-26 SURGERY — RADIOACTIVE SEED GUIDED BREAST BIOPSY
Anesthesia: General | Site: Breast | Laterality: Right

## 2022-05-26 MED ORDER — FENTANYL CITRATE (PF) 100 MCG/2ML IJ SOLN: 25.0000 ug | INTRAMUSCULAR | Status: DC | PRN

## 2022-05-26 MED ORDER — CIPROFLOXACIN IN D5W 400 MG/200ML IV SOLN
400.0000 mg | INTRAVENOUS | Status: AC
  Administered 2022-05-26: 400 mg via INTRAVENOUS

## 2022-05-26 MED ORDER — KETOROLAC TROMETHAMINE 30 MG/ML IJ SOLN
INTRAMUSCULAR | Status: DC | PRN
  Administered 2022-05-26: 15 mg via INTRAVENOUS

## 2022-05-26 MED ORDER — MIDAZOLAM HCL 5 MG/5ML IJ SOLN
INTRAMUSCULAR | Status: DC | PRN
  Administered 2022-05-26: 2 mg via INTRAVENOUS

## 2022-05-26 MED ORDER — DEXAMETHASONE SODIUM PHOSPHATE 10 MG/ML IJ SOLN
INTRAMUSCULAR | Status: AC
  Filled 2022-05-26: qty 1

## 2022-05-26 MED ORDER — PROPOFOL 10 MG/ML IV BOLUS
INTRAVENOUS | Status: DC | PRN
  Administered 2022-05-26: 200 mg via INTRAVENOUS

## 2022-05-26 MED ORDER — KETOROLAC TROMETHAMINE 30 MG/ML IJ SOLN
INTRAMUSCULAR | Status: AC
  Filled 2022-05-26: qty 1

## 2022-05-26 MED ORDER — FENTANYL CITRATE (PF) 100 MCG/2ML IJ SOLN
INTRAMUSCULAR | Status: DC | PRN
  Administered 2022-05-26 (×2): 50 ug via INTRAVENOUS

## 2022-05-26 MED ORDER — KETOROLAC TROMETHAMINE 15 MG/ML IJ SOLN: 15.0000 mg | Freq: Once | INTRAMUSCULAR | Status: AC | PRN

## 2022-05-26 MED ORDER — EPHEDRINE SULFATE (PRESSORS) 50 MG/ML IJ SOLN
INTRAMUSCULAR | Status: DC | PRN
  Administered 2022-05-26: 5 mg via INTRAVENOUS
  Administered 2022-05-26: 10 mg via INTRAVENOUS

## 2022-05-26 MED ORDER — PROPOFOL 10 MG/ML IV BOLUS
INTRAVENOUS | Status: AC
  Filled 2022-05-26: qty 20

## 2022-05-26 MED ORDER — CIPROFLOXACIN IN D5W 400 MG/200ML IV SOLN
INTRAVENOUS | Status: AC
  Filled 2022-05-26: qty 200

## 2022-05-26 MED ORDER — OXYCODONE HCL 5 MG PO TABS: 5.0000 mg | ORAL_TABLET | Freq: Four times a day (QID) | ORAL | 0 refills | Status: DC | PRN

## 2022-05-26 MED ORDER — PHENYLEPHRINE 80 MCG/ML (10ML) SYRINGE FOR IV PUSH (FOR BLOOD PRESSURE SUPPORT)
PREFILLED_SYRINGE | INTRAVENOUS | Status: AC
  Filled 2022-05-26: qty 10

## 2022-05-26 MED ORDER — LIDOCAINE-EPINEPHRINE (PF) 1 %-1:200000 IJ SOLN
INTRAMUSCULAR | Status: DC | PRN
  Administered 2022-05-26: 40 mL via INTRAMUSCULAR

## 2022-05-26 MED ORDER — AMISULPRIDE (ANTIEMETIC) 5 MG/2ML IV SOLN: 10.0000 mg | Freq: Once | INTRAVENOUS | Status: DC | PRN

## 2022-05-26 MED ORDER — FENTANYL CITRATE (PF) 100 MCG/2ML IJ SOLN
INTRAMUSCULAR | Status: AC
  Filled 2022-05-26: qty 2

## 2022-05-26 MED ORDER — PHENYLEPHRINE HCL (PRESSORS) 10 MG/ML IV SOLN
INTRAVENOUS | Status: DC | PRN
  Administered 2022-05-26 (×5): 80 ug via INTRAVENOUS

## 2022-05-26 MED ORDER — LIDOCAINE 2% (20 MG/ML) 5 ML SYRINGE
INTRAMUSCULAR | Status: AC
  Filled 2022-05-26: qty 5

## 2022-05-26 MED ORDER — MIDAZOLAM HCL 2 MG/2ML IJ SOLN
INTRAMUSCULAR | Status: AC
  Filled 2022-05-26: qty 2

## 2022-05-26 MED ORDER — ONDANSETRON HCL 4 MG/2ML IJ SOLN
INTRAMUSCULAR | Status: AC
  Filled 2022-05-26: qty 2

## 2022-05-26 MED ORDER — LACTATED RINGERS IV SOLN: INTRAVENOUS | Status: DC

## 2022-05-26 MED ORDER — ONDANSETRON HCL 4 MG/2ML IJ SOLN
INTRAMUSCULAR | Status: DC | PRN
  Administered 2022-05-26: 4 mg via INTRAVENOUS

## 2022-05-26 MED ORDER — LIDOCAINE HCL (CARDIAC) PF 100 MG/5ML IV SOSY
PREFILLED_SYRINGE | INTRAVENOUS | Status: DC | PRN
  Administered 2022-05-26: 60 mg via INTRAVENOUS

## 2022-05-26 MED ORDER — ONDANSETRON HCL 4 MG/2ML IJ SOLN: 4.0000 mg | Freq: Once | INTRAMUSCULAR | Status: DC | PRN

## 2022-05-26 MED ORDER — DEXAMETHASONE SODIUM PHOSPHATE 4 MG/ML IJ SOLN
INTRAMUSCULAR | Status: DC | PRN
  Administered 2022-05-26: 5 mg via INTRAVENOUS

## 2022-05-26 MED ORDER — ACETAMINOPHEN 500 MG PO TABS
1000.0000 mg | ORAL_TABLET | ORAL | Status: AC
  Administered 2022-05-26: 1000 mg via ORAL

## 2022-05-26 MED ORDER — ACETAMINOPHEN 500 MG PO TABS
ORAL_TABLET | ORAL | Status: AC
  Filled 2022-05-26: qty 2

## 2022-05-26 SURGICAL SUPPLY — 50 items
ADH SKN CLS APL DERMABOND .7 (GAUZE/BANDAGES/DRESSINGS) ×1
APL PRP STRL LF DISP 70% ISPRP (MISCELLANEOUS) ×1
BINDER BREAST LRG (GAUZE/BANDAGES/DRESSINGS) IMPLANT
BLADE SURG 10 STRL SS (BLADE) ×1 IMPLANT
CANISTER SUC SOCK COL 7IN (MISCELLANEOUS) IMPLANT
CANISTER SUCT 1200ML W/VALVE (MISCELLANEOUS) ×1 IMPLANT
CHLORAPREP W/TINT 26 (MISCELLANEOUS) ×1 IMPLANT
CLIP TI LARGE 6 (CLIP) ×1 IMPLANT
COVER BACK TABLE 60X90IN (DRAPES) ×1 IMPLANT
COVER MAYO STAND STRL (DRAPES) ×1 IMPLANT
COVER PROBE CYLINDRICAL 5X96 (MISCELLANEOUS) ×1 IMPLANT
DERMABOND ADVANCED .7 DNX12 (GAUZE/BANDAGES/DRESSINGS) ×1 IMPLANT
DRAPE LAPAROSCOPIC ABDOMINAL (DRAPES) ×1 IMPLANT
DRAPE UTILITY XL STRL (DRAPES) ×1 IMPLANT
ELECT COATED BLADE 2.86 ST (ELECTRODE) ×1 IMPLANT
ELECT REM PT RETURN 9FT ADLT (ELECTROSURGICAL) ×1
ELECTRODE REM PT RTRN 9FT ADLT (ELECTROSURGICAL) ×1 IMPLANT
GAUZE SPONGE 4X4 12PLY STRL LF (GAUZE/BANDAGES/DRESSINGS) ×1 IMPLANT
GLOVE BIO SURGEON STRL SZ 6 (GLOVE) ×1 IMPLANT
GLOVE BIOGEL PI IND STRL 6.5 (GLOVE) ×1 IMPLANT
GLOVE BIOGEL PI IND STRL 7.0 (GLOVE) IMPLANT
GLOVE BIOGEL PI IND STRL 7.5 (GLOVE) IMPLANT
GLOVE SURG SS PI 6.5 STRL IVOR (GLOVE) IMPLANT
GLOVE SURG SS PI 7.0 STRL IVOR (GLOVE) IMPLANT
GOWN STRL REUS W/ TWL LRG LVL3 (GOWN DISPOSABLE) ×1 IMPLANT
GOWN STRL REUS W/TWL 2XL LVL3 (GOWN DISPOSABLE) ×1 IMPLANT
GOWN STRL REUS W/TWL LRG LVL3 (GOWN DISPOSABLE) ×2
KIT MARKER MARGIN INK (KITS) ×1 IMPLANT
LIGHT WAVEGUIDE WIDE FLAT (MISCELLANEOUS) IMPLANT
NDL HYPO 25X1 1.5 SAFETY (NEEDLE) ×1 IMPLANT
NEEDLE HYPO 25X1 1.5 SAFETY (NEEDLE) ×1 IMPLANT
NS IRRIG 1000ML POUR BTL (IV SOLUTION) ×1 IMPLANT
PACK BASIN DAY SURGERY FS (CUSTOM PROCEDURE TRAY) ×1 IMPLANT
PENCIL SMOKE EVACUATOR (MISCELLANEOUS) ×1 IMPLANT
SLEEVE SCD COMPRESS KNEE MED (STOCKING) ×1 IMPLANT
SPIKE FLUID TRANSFER (MISCELLANEOUS) IMPLANT
SPONGE T-LAP 18X18 ~~LOC~~+RFID (SPONGE) ×1 IMPLANT
STRIP CLOSURE SKIN 1/2X4 (GAUZE/BANDAGES/DRESSINGS) ×1 IMPLANT
SUT MON AB 4-0 PC3 18 (SUTURE) ×1 IMPLANT
SUT SILK 2 0 SH (SUTURE) IMPLANT
SUT VIC AB 2-0 SH 18 (SUTURE) IMPLANT
SUT VIC AB 3-0 SH 27 (SUTURE)
SUT VIC AB 3-0 SH 27X BRD (SUTURE) ×1 IMPLANT
SUT VICRYL 3-0 CR8 SH (SUTURE) IMPLANT
SYR BULB EAR ULCER 3OZ GRN STR (SYRINGE) ×1 IMPLANT
SYR CONTROL 10ML LL (SYRINGE) ×1 IMPLANT
TOWEL GREEN STERILE FF (TOWEL DISPOSABLE) ×1 IMPLANT
TRAY FAXITRON CT DISP (TRAY / TRAY PROCEDURE) ×1 IMPLANT
TUBE CONNECTING 20X1/4 (TUBING) ×1 IMPLANT
YANKAUER SUCT BULB TIP NO VENT (SUCTIONS) ×1 IMPLANT

## 2022-05-26 NOTE — Op Note (Signed)
Right Breast Radioactive seed localized excisional biopsy  Indications: This patient presents with history of abnormal right mammogram with discordant core needle biopsy.  (ADH)  Pre-operative Diagnosis: abnormal right mammogram    Post-operative Diagnosis: abnormal right mammogram  Surgeon: Stark Klein   Anesthesia: General endotracheal anesthesia  ASA Class: 2  Procedure Details  The patient was seen in the Holding Room. The risks, benefits, complications, treatment options, and expected outcomes were discussed with the patient. The possibilities of bleeding, infection, the need for additional procedures, failure to diagnose a condition, and creating a complication requiring transfusion or operation were discussed with the patient. The patient concurred with the proposed plan, giving informed consent.  The site of surgery properly noted/marked. The patient was taken to Operating Room # 8, identified, and the procedure verified as right Breast seed localized excisional biopsy. A Time Out was held and the above information confirmed.  The right breast and chest were prepped and draped in standard fashion. A medial circumareolar incision was made near the previously placed radioactive seed.  Dissection was carried down around the point of maximum signal intensity. The cautery was used to perform the dissection.   There was a copious amount of inspissated secretions present in the breast adjacent to the seed.  The specimen was inked with the margin marker paint kit.    Specimen radiography confirmed inclusion of the mammographic lesion and the seed.  The background signal in the breast was zero. Additional margins were taken to look for the seed which was not present in the specimens.  This is assumed to be in the secretions that were removed with the laparotomy sponge.   Hemostasis was achieved with cautery.  The wound was irrigated and closed with 3-0 vicryl interrupted deep dermal sutures and 4-0  monocryl running subcuticular suture.  Local anesthetic was infiltrated into the breast around the incision.      Sterile dressings were applied. At the end of the operation, all sponge, instrument, and needle counts were correct.  Findings: Seed in specimen.  posterior margin is pectoralis, anterior margin is skin.  Copious inspissated secretions below nipple at dissection margin.    Estimated Blood Loss:  min         Specimens: right breast tissue with seed, additional posterolateral margin, inferior margin, superior margin, medial margin, anterior margin.           Complications:  None; patient tolerated the procedure well.         Disposition: PACU - hemodynamically stable.         Condition: stable

## 2022-05-26 NOTE — Anesthesia Procedure Notes (Signed)
Procedure Name: LMA Insertion Date/Time: 05/26/2022 8:03 AM  Performed by: Bufford Spikes, CRNAPre-anesthesia Checklist: Patient identified, Emergency Drugs available, Suction available and Patient being monitored Patient Re-evaluated:Patient Re-evaluated prior to induction Oxygen Delivery Method: Circle system utilized Preoxygenation: Pre-oxygenation with 100% oxygen Induction Type: IV induction Ventilation: Mask ventilation without difficulty LMA: LMA inserted LMA Size: 4.0 Number of attempts: 1 Placement Confirmation: positive ETCO2 Tube secured with: Tape Dental Injury: Teeth and Oropharynx as per pre-operative assessment

## 2022-05-26 NOTE — Discharge Instructions (Addendum)
[place] Office Phone Number [phone]  BREAST BIOPSY/ PARTIAL MASTECTOMY: POST OP INSTRUCTIONS  Always review your discharge instruction sheet given to you by the facility where your surgery was performed.  IF YOU HAVE DISABILITY OR FAMILY LEAVE FORMS, YOU MUST BRING THEM TO THE OFFICE FOR PROCESSING.  DO NOT GIVE THEM TO YOUR DOCTOR.  Take 2 tylenol (acetominophen) three times a day for 3 days.  If you still have pain, add ibuprofen with food in between if able to take this (if you have kidney issues or stomach issues, do not take ibuprofen).  If both of those are not enough, add the narcotic pain pill.  If you find you are needing a lot of this overnight after surgery, call the next morning for a refill.    Prescriptions will not be filled after 5pm or on week-ends. Take your usually prescribed medications unless otherwise directed You should eat very light the first 24 hours after surgery, such as soup, crackers, pudding, etc.  Resume your normal diet the day after surgery. Most patients will experience some swelling and bruising in the breast.  Ice packs and a good support bra will help.  Swelling and bruising can take several days to resolve.  It is common to experience some constipation if taking pain medication after surgery.  Increasing fluid intake and taking a stool softener will usually help or prevent this problem from occurring.  A mild laxative (Milk of Magnesia or Miralax) should be taken according to package directions if there are no bowel movements after 48 hours. Unless discharge instructions indicate otherwise, you may remove your bandages 48 hours after surgery, and you may shower at that time.  You may have steri-strips (small skin tapes) in place directly over the incision.  These strips should be left on the skin at least for for 7-10 days.    ACTIVITIES:  You may resume regular daily activities (gradually increasing) beginning the next day.  Wearing a  good support bra or sports bra (or the breast binder) minimizes pain and swelling.  You may have sexual intercourse when it is comfortable. No heavy lifting for 1-2 weeks (not over around 10 pounds).  You may drive when you no longer are taking prescription pain medication, you can comfortably wear a seatbelt, and you can safely maneuver your car and apply brakes. RETURN TO WORK:  __________3-14 days depending on job. _______________ Dennis Bast should see your doctor in the office for a follow-up appointment approximately two weeks after your surgery.  Your doctor's nurse will typically make your follow-up appointment when she calls you with your pathology report.  Expect your pathology report 3-4 business days after your surgery.  You may call to check if you do not hear from [place] after three days.   WHEN TO CALL YOUR DOCTOR: Fever over 101.0 Nausea and/or vomiting. Extreme swelling or bruising. Continued bleeding from incision. Increased pain, redness, or drainage from the incision.  The clinic staff is available to answer your questions during regular business hours.  Please don't hesitate to call and ask to speak to one of the nurses for clinical concerns.  If you have a medical emergency, go to the nearest emergency room or call 911.  A surgeon from [facility] is always on call at the hospital.  For further questions, please visit [email or web address]     You may have Tylenol again at 12:45pm today, if needed.   You may have Ibuprofen/NSAIDS after 5pm today, if needed.  Post Anesthesia Home Care Instructions  Activity: Get plenty of rest for the remainder of the day. A responsible individual must stay with you for 24 hours following the procedure.  For the next 24 hours, DO NOT: -Drive a car -Paediatric nurse -Drink alcoholic beverages -Take any medication unless instructed by your physician -Make any legal decisions or sign important papers.  Meals: Start with  liquid foods such as gelatin or soup. Progress to regular foods as tolerated. Avoid greasy, spicy, heavy foods. If nausea and/or vomiting occur, drink only clear liquids until the nausea and/or vomiting subsides. Call your physician if vomiting continues.  Special Instructions/Symptoms: Your throat may feel dry or sore from the anesthesia or the breathing tube placed in your throat during surgery. If this causes discomfort, gargle with warm salt water. The discomfort should disappear within 24 hours.  If you had a scopolamine patch placed behind your ear for the management of post- operative nausea and/or vomiting:  1. The medication in the patch is effective for 72 hours, after which it should be removed.  Wrap patch in a tissue and discard in the trash. Wash hands thoroughly with soap and water. 2. You may remove the patch earlier than 72 hours if you experience unpleasant side effects which may include dry mouth, dizziness or visual disturbances. 3. Avoid touching the patch. Wash your hands with soap and water after contact with the patch.

## 2022-05-26 NOTE — Transfer of Care (Signed)
Immediate Anesthesia Transfer of Care Note  Patient: Rachael Sharp  Procedure(s) Performed: RADIOACTIVE SEED GUIDED EXCISIONAL RIGHT BREAST BIOPSY (Right: Breast)  Patient Location: PACU  Anesthesia Type:General  Level of Consciousness: awake, alert , and oriented  Airway & Oxygen Therapy: Patient Spontanous Breathing and Patient connected to nasal cannula oxygen  Post-op Assessment: Report given to RN and Post -op Vital signs reviewed and stable  Post vital signs: Reviewed and stable  Last Vitals:  Vitals Value Taken Time  BP 125/82 05/26/22 0915  Temp    Pulse 85 05/26/22 0918  Resp 17 05/26/22 0918  SpO2 100 % 05/26/22 0918  Vitals shown include unvalidated device data.  Last Pain:  Vitals:   05/26/22 0644  TempSrc: Oral  PainSc: 0-No pain      Patients Stated Pain Goal: 3 (77/11/65 7903)  Complications: No notable events documented.

## 2022-05-26 NOTE — Anesthesia Postprocedure Evaluation (Signed)
Anesthesia Post Note  Patient: DELMA DRONE  Procedure(s) Performed: RADIOACTIVE SEED GUIDED EXCISIONAL RIGHT BREAST BIOPSY (Right: Breast)     Patient location during evaluation: PACU Anesthesia Type: General Level of consciousness: awake Pain management: pain level controlled Vital Signs Assessment: post-procedure vital signs reviewed and stable Respiratory status: spontaneous breathing, nonlabored ventilation and respiratory function stable Cardiovascular status: blood pressure returned to baseline and stable Postop Assessment: no apparent nausea or vomiting Anesthetic complications: no   No notable events documented.  Last Vitals:  Vitals:   05/26/22 1000 05/26/22 1031  BP: 131/83 (!) 150/90  Pulse: 81 90  Resp: 13 16  Temp: 36.5 C 36.5 C  SpO2: 97% 96%    Last Pain:  Vitals:   05/26/22 1031  TempSrc: Oral  PainSc: 0-No pain                 Elzada Pytel P Isayah Ignasiak

## 2022-05-26 NOTE — H&P (Signed)
REFERRING PHYSICIAN: Dorise Bullion.  PROVIDER: Georgianne Fick, MD  Care Team: Patient Care Team: Aneta Mins, MD as PCP - General (Family Medicine)  MRN: [id number] DOB: [birth year]  Subjective  Chief Complaint: Right Breast Atypical Ductal Hyperplasia  History of Present Illness: Rachael Sharp is a 72 y.o. female who is seen today as an office consultation at the request of Dr. Diona Browner for evaluation of Right Breast Atypical Ductal Hyperplasia  Patient is a 72 year old female who developed mild nipple inversion and decreased sensitivity of the nipple around February of 2022. She had diagnostic imaging and there was no findings. She has a known Chek 2 mutation and gets alternating mammograms with MRIs. In September 2023, she noticed that the nipple inversion was dramatically different and that her diagnostic mammogram moved up. Her diagnostic mammogram and ultrasound showed only benign findings. She has known breast cysts but these were not mentioned in this report. She had a BI-RADS 1 reading of her diagnostic imaging performed in October 2023. She subsequently underwent her breast MRI and this was shown to have 3.3 cm of non-mass loosely grouped enhancement. She did have 2 adjacent cysts consistent with proteinaceous cyst. She had no masses seen. She had a biopsy of the non-mass enhancement and was seen to have an intraductal papilloma with atypical ductal hyperplasia. Patient states that after the hematoma resolved from her biopsy, the nipple inversion almost completely resolved.  Of note, she does have significant family history of cancer. She has 4 paternal aunts who had breast cancer.  The patient does work out with lifting weights and using a stationary bike.  Diagnostic mammogram/us 02/20/2022 ACR Breast Density Category b: There are scattered areas of fibroglandular density.  FINDINGS: Mammogram:  Right breast: A spot compression tomosynthesis view of  the retroareolar right breast was performed in addition to standard views. No new abnormality in the retroareolar aspect to explain the patient's right nipple inversion. There are no new suspicious findings elsewhere in the right breast.  Left breast: No suspicious mass, distortion, or microcalcifications are identified to suggest presence of malignancy.  On physical exam the right nipple is inverted.  Ultrasound:  Targeted ultrasound is performed throughout the retroareolar aspect of the right breast demonstrating no new suspicious mass. No dilated duct or focal fluid collection.  IMPRESSION: 1. No mammographic or sonographic evidence of malignancy in the right breast or other finding to explain the patient's progressive right nipple inversion.  2. No mammographic evidence of malignancy in the left breast.  RECOMMENDATION: 1. Bilateral breast MRI. This has been performed and can now be read out.  2. Surgical consultation for right nipple inversion.  Breast MRI 02/23/2022  IMPRESSION: 1. Loosely grouped area of non mass enhancement in the medial retroareolar to central right breast spanning 3.3 x 2.0 x 2.6 cm. Given the patient's elevated risk for developing breast carcinoma and her current new symptoms of nipple inversion, tissue sampling is recommended. 2. No other findings suspicious for breast malignancy. 3. Two adjacent benign right breast cysts, mildly complicated.  RECOMMENDATION: 1. MRI guided core needle biopsy of the area right breast non mass enhancement.  BI-RADS CATEGORY 4: Suspicious.   Pathology core needle biopsy: 02/27/2022 Breast, right, needle core biopsy, medial - INTRADUCTAL PAPILLOMA WITH ATYPICAL DUCTAL HYPERPLASIA.  Review of Systems: A complete review of systems was obtained from the patient. I have reviewed this information and discussed as appropriate with the patient. See HPI as well for other ROS. 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  Past Surgical History: Procedure Laterality Date TONSILLECTOMY 1964 LAPAROSCOPIC TUBAL LIGATION Bilateral 1982 lipoma excision 1983 HYSTERECTOMY 2002 BREAST BIOPSY   Allergies Allergen Reactions Penicillins Rash REACTION: rash   Epinephrine Palpitations  Current Outpatient Medications on File Prior to Visit Medication Sig Dispense Refill amitriptyline (ELAVIL) 10 MG tablet Take 3 tablets by mouth nightly 3 C/sourcherry/celery/grape seed (TART CHERRY ORAL) Take 1 capsule by mouth 2 (two) times daily Tart cherry capsules - '1200mg'$  per capsule - takes 1 capsule po bid calcium carb/magnesium oxid/D3 (CALCIUM CARB-MAG OXIDE-VIT D3) 500-40-250 mg-mg-unit Chew Take 1 tablet by mouth 2 (two) times daily Vitality Calcium Complete: 1 tab po bid. (Vitamin D3 100IU, Calcium '250mg'$ , Magnesium '50mg'$  per each tablet) cyclobenzaprine (FLEXERIL) 10 MG tablet Take by mouth (Patient not taking: Reported on 04/24/2022) docosahexanoic acid/epa (FISH OIL ORAL) Take 1 capsule by mouth 2 (two) times daily Fish Oil '300mg'$  multivitamin tablet Take 5 tablets by mouth 2 (two) times daily Vitality multivitamins & mineral 2 tablets po bid sertraline (ZOLOFT) 25 MG tablet Take 25 mg by mouth once daily  No current facility-administered medications on file prior to visit.  Family History Problem  Relation Age of Onset Stroke Mother COPD Mother Coronary Artery Disease (Blocked arteries around heart) Father Heart disease Father Hyperlipidemia (Elevated cholesterol) Father Hyperlipidemia (Elevated cholesterol) Sister Obesity Sister Stroke Sister COPD Sister Breast cancer Paternal 55 Breast cancer Paternal 42 Breast cancer Paternal Aunt Breast cancer Paternal Aunt   Social History  Tobacco Use Smoking Status Former Packs/day: 0.25 Years: 23.00 Additional pack years: 0.00 Total pack years: 5.75 Types: Cigarettes Quit date: 1991 Years since quitting: 32.9 Smokeless Tobacco Never   Social History  Socioeconomic History Marital status: Married Spouse name: Shyia Fillingim Number of children: 0 Years of education: 14 Highest education level: High school graduate Occupational History Occupation: Herbalist Comment: Retired Tobacco Use Smoking status: Former Packs/day: 0.25 Years: 23.00 Additional pack years: 0.00 Total pack years: 5.75 Types: Cigarettes Quit date: 1991 Years since quitting: 32.9 Smokeless tobacco: Never Vaping Use Vaping Use: Never used Substance and Sexual Activity Alcohol use: Yes Comment: 2/day Drug use: Never  Objective:  Vitals: BP: 138/80 Pulse: 93 Temp: 36.6 C (97.9 F) SpO2: 97% Weight: 69.7 kg (153 lb 9.6 oz) Height: 164.5 cm (5' 4.75") PainSc: 0-No pain  Body mass index is 25.76 kg/m.  Gen: No acute distress. Well nourished and well groomed. Neurological: Alert and oriented to person, place, and time. Coordination normal. Head: Normocephalic and atraumatic. Eyes: Conjunctivae are normal. Pupils are equal, round, and reactive to light. No scleral icterus. Neck: Normal range of motion. Neck supple. No tracheal deviation or thyromegaly present. Cardiovascular: Normal rate, regular rhythm, normal heart sounds and intact distal pulses. Exam reveals no gallop and no friction rub. No murmur heard. Breast:  minimal ptosis. Breasts relatively symmetric. No palpable masses. Right nipple slightly different that left with very tiny amount of central inversion. No LAD. No skin dimpling or contour change. Respiratory: Effort normal. No respiratory distress. No chest wall tenderness.  Breath sounds normal. No wheezes, rales or rhonchi. GI: Soft. Bowel sounds are normal. The abdomen is soft and nontender. There is no rebound and no guarding. Musculoskeletal: Normal range of motion. Extremities are nontender. Lymphadenopathy: No cervical, preauricular, postauricular or axillary adenopathy is present Skin: Skin is warm and dry. No rash noted. No diaphoresis. No erythema. No pallor. No clubbing, cyanosis, or edema. Psychiatric: Normal mood and affect. Behavior is normal. Judgment and thought content normal.  Labs None new.  Assessment and Plan:  ICD-10-CM 1. Monoallelic mutation of CHEK2 gene in female patient Z15.01 Z15.89 Z15.09 Z15.02  2. Intraductal papilloma of right breast D24.1  3. Atypical ductal hyperplasia of right breast N60.91  4. Family history of breast cancer Z80.3  5. Inversion of right nipple N64.59   Patient has a papilloma as well as atypical ductal hyperplasia seen on core needle biopsy. She does have a concerning finding of non-mass enhancement as well as nipple inversion in the presence of a CHEK2 mutation. I definitely recommend an excisional biopsy. I discussed that there is a risk of finding cancer on the biopsy that may require additional treatment.  The surgical procedure was described to the patient. I discussed the incision type and location and that we will need radiology involved on with a seed marker.  We discussed the risks bleeding, infection, damage to other structures, need for further procedures/surgeries. We discussed the risk of seroma. The patient was advised if the breast has cancer, we may need to go back to surgery for additional tissue to obtain negative  margins or for a lymph node biopsy. The patient was advised that these are the most common complications, but that others can occur as well. I discussed the risk of alteration in breast contour or size. I discussed risk of chronic pain. There are rare instances of heart/lung issues post op as well as blood clots.  They were advised against taking aspirin or other anti-inflammatory agents/blood thinners the week before surgery.  The risks and benefits of the procedure were described to the patient and she wishes to proceed.  I will refer the patient to oncology to discuss chemoprevention. The referral should be after her surgery in order to maximize the discussion in the event that she does have a malignancy.

## 2022-05-26 NOTE — Interval H&P Note (Signed)
History and Physical Interval Note:  05/26/2022 7:48 AM  Rachael Sharp  has presented today for surgery, with the diagnosis of RIGHT BREAST PAPILLOMA.  The various methods of treatment have been discussed with the patient and family. After consideration of risks, benefits and other options for treatment, the patient has consented to  Procedure(s): RADIOACTIVE SEED GUIDED EXCISIONAL RIGHT BREAST BIOPSY (Right) as a surgical intervention.  The patient's history has been reviewed, patient examined, no change in status, stable for surgery.  I have reviewed the patient's chart and labs.  Questions were answered to the patient's satisfaction.     Stark Klein

## 2022-05-27 ENCOUNTER — Encounter (HOSPITAL_BASED_OUTPATIENT_CLINIC_OR_DEPARTMENT_OTHER): Payer: Self-pay | Admitting: General Surgery

## 2022-05-28 ENCOUNTER — Encounter (HOSPITAL_COMMUNITY): Payer: Self-pay

## 2022-06-02 ENCOUNTER — Telehealth: Payer: Self-pay | Admitting: Hematology and Oncology

## 2022-06-02 ENCOUNTER — Telehealth: Payer: Self-pay | Admitting: General Surgery

## 2022-06-02 NOTE — Telephone Encounter (Signed)
Discussed path with patient.  2 mm of DCIS present.  Pt already has med onc appt.  Will also make rad onc appt.

## 2022-06-02 NOTE — Telephone Encounter (Signed)
Rescheduled appointment per patients request via telephone call. Patient is aware of the changes made to her upcoming appointment.

## 2022-06-03 NOTE — Progress Notes (Signed)
New Breast Cancer Diagnosis: Right Breast  Did patient present with symptoms (if so, please note symptoms) or screening mammography?: Patient noted in February 2022 to have mild nipple inversion and decreased sensitivity of the nipple.  She had diagnostic imaging and there was no findings. She has a known Chek 2 mutation and gets alternating mammograms with MRIs.   Location and Extent of disease :Breast MRI showed a 3.3 cm non-mass loosely grouped enhancement.  Noted were 2 adjacent cysts consistent with proteinaceous cyst.    Histology per Pathology Report: grade , DCIS and Intraductal Papilloma. 05/26/2022  Receptor Status: ER({Desc; negative/positive:13464::"positive"}), PR ({Desc; negative/positive:13464::"positive"}), Her2-neu (), Ki-(%)   Surgeon and surgical plan, if any:  Dr. Barry Dienes 04/24/2022 -Patient has a papilloma as well as atypical ductal hyperplasia seen on core needle biopsy. She does have a concerning finding of non-mass enhancement as well as nipple inversion in the presence of a CHEK2 mutation. I definitely recommend an excisional biopsy. I discussed that there is a risk of finding cancer on the biopsy that may require additional treatment.  -I will refer the patient to oncology to discuss chemoprevention. The referral should be after her surgery in order to maximize the discussion in the event that she does have a malignancy.    Medical oncologist, treatment if any:   Dr. Lindi Adie 06/23/2022   Family History of Breast/Ovarian/Prostate Cancer: 4 Paternal Aunts had breast cancer.  Lymphedema issues, if any:      Pain issues, if any:     SAFETY ISSUES: Prior radiation?  Pacemaker/ICD?  Possible current pregnancy? Hysterectomy Is the patient on methotrexate?   Current Complaints / other details:

## 2022-06-04 ENCOUNTER — Encounter: Payer: Self-pay | Admitting: Radiation Oncology

## 2022-06-04 ENCOUNTER — Ambulatory Visit
Admission: RE | Admit: 2022-06-04 | Discharge: 2022-06-04 | Disposition: A | Discharge status: Home / Self Care | Payer: Medicare Other | Source: Ambulatory Visit | Attending: Radiation Oncology | Admitting: Radiation Oncology

## 2022-06-04 ENCOUNTER — Inpatient Hospital Stay: Payer: Medicare Other | Admitting: Hematology and Oncology

## 2022-06-04 ENCOUNTER — Other Ambulatory Visit: Payer: Self-pay

## 2022-06-04 ENCOUNTER — Telehealth: Payer: Self-pay | Admitting: *Deleted

## 2022-06-04 VITALS — BP 146/89 | HR 80 | Temp 97.8°F | Resp 20 | Ht 64.75 in | Wt 157.0 lb

## 2022-06-04 DIAGNOSIS — Z87891 Personal history of nicotine dependence: Secondary | ICD-10-CM | POA: Insufficient documentation

## 2022-06-04 DIAGNOSIS — D0511 Intraductal carcinoma in situ of right breast: Secondary | ICD-10-CM | POA: Insufficient documentation

## 2022-06-04 DIAGNOSIS — R011 Cardiac murmur, unspecified: Secondary | ICD-10-CM | POA: Diagnosis not present

## 2022-06-04 DIAGNOSIS — Z170 Estrogen receptor positive status [ER+]: Secondary | ICD-10-CM | POA: Diagnosis not present

## 2022-06-04 DIAGNOSIS — Z1501 Genetic susceptibility to malignant neoplasm of breast: Secondary | ICD-10-CM | POA: Insufficient documentation

## 2022-06-04 DIAGNOSIS — K219 Gastro-esophageal reflux disease without esophagitis: Secondary | ICD-10-CM | POA: Insufficient documentation

## 2022-06-04 DIAGNOSIS — M129 Arthropathy, unspecified: Secondary | ICD-10-CM | POA: Diagnosis not present

## 2022-06-04 DIAGNOSIS — Z803 Family history of malignant neoplasm of breast: Secondary | ICD-10-CM | POA: Insufficient documentation

## 2022-06-04 DIAGNOSIS — Z7982 Long term (current) use of aspirin: Secondary | ICD-10-CM | POA: Diagnosis not present

## 2022-06-04 DIAGNOSIS — E785 Hyperlipidemia, unspecified: Secondary | ICD-10-CM | POA: Insufficient documentation

## 2022-06-04 DIAGNOSIS — Z79899 Other long term (current) drug therapy: Secondary | ICD-10-CM | POA: Insufficient documentation

## 2022-06-04 DIAGNOSIS — Z86010 Personal history of colonic polyps: Secondary | ICD-10-CM | POA: Insufficient documentation

## 2022-06-04 DIAGNOSIS — E559 Vitamin D deficiency, unspecified: Secondary | ICD-10-CM | POA: Insufficient documentation

## 2022-06-04 NOTE — Progress Notes (Signed)
Radiation Oncology         ([phone]) [phone] ________________________________  Name: Rachael Sharp        MRN: [id number]  Date of Service: 06/04/2022 DOB: [birth year]  NA:[id number], Mervyn Gay, MD  Stark Klein, MD     REFERRING PHYSICIAN: Stark Klein, MD   DIAGNOSIS: The encounter diagnosis was Ductal carcinoma in situ (DCIS) of right breast.   HISTORY OF PRESENT ILLNESS: Rachael Sharp is a 72 y.o. female seen at the request of Dr. Barry Dienes for a new diagnosis of right breast cancer. The patient had a normal mammogram in May 2023 but is followed due to a history of CHEK2 mutation. She developed progressive right nipple inversion and diagnostic imaging on 02/20/2022 showed an inverted right nipple.  By ultrasound there was no sonographic evidence of malignancy.  She underwent an MRI of both breasts on 02/18/2022 however which did show loosely grouped  non-mass enhancement measuring up to 3.3 cm with 2 adjacent cysts in the central right breast the largest measuring 1.8 cm.  No abnormalities or enhancement was seen in the left breast and neither of the axillae showed abnormalities.  She did undergo a stereotactic biopsy on 02/27/2022 which showed intraductal papilloma with atypical ductal hyperplasia.  She met with Dr. Barry Dienes and was counseled on the lumpectomy.  She proceeded with this on 05/26/2022 which showed low-grade DCIS measuring 2 mm, associated with a papilloma.  No invasive component was identified and additional lateral, inferior, superior, medial, and anterior margins were all negative for carcinoma.  Her DCIS prognostic panel has not resulted.  She is seen today to discuss treatment recommendations in the adjuvant setting given her diagnosis of cancer.  She is scheduled to meet with Dr. Lindi Adie on 06/23/2022.    PREVIOUS RADIATION THERAPY: No   PAST MEDICAL HISTORY:  Past Medical History:  Diagnosis Date   Anxiety    Arthritis    finger left index   GERD (gastroesophageal  reflux disease)    Heart murmur    mitral valve prolapse   Hyperlipidemia    denies 10/21/18,not on medication   Hyperplastic colon polyp    Monoallelic mutation of CHEK2 gene in female patient    Vitamin D deficiency        PAST SURGICAL HISTORY: Past Surgical History:  Procedure Laterality Date   ABDOMINAL HYSTERECTOMY  12/2000   BREAST BIOPSY     biopsy x 2 , 1976 and 2002   BREAST BIOPSY  05/21/2022   MM RT RADIOACTIVE SEED LOC MAMMO GUIDE 05/21/2022 [facility]   BREAST CYST ASPIRATION     COLONOSCOPY  11/03/2018   Dr.Pyrtle   COLONOSCOPY  2017   LIPOMA EXCISION  1983   RADIOACTIVE SEED GUIDED EXCISIONAL BREAST BIOPSY Right 05/26/2022   Procedure: RADIOACTIVE SEED GUIDED EXCISIONAL RIGHT BREAST BIOPSY;  Surgeon: Stark Klein, MD;  Location: [facility];  Service: General;  Laterality: Right;   TONSILLECTOMY  1964   TUBAL LIGATION  1982     FAMILY HISTORY:  Family History  Problem Relation Age of Onset   COPD Sister    Heart disease Sister 56       massive CVA,     Stroke Sister    Stroke Mother    COPD Mother    Heart disease Father    Hyperlipidemia Father    Breast cancer Paternal Aunt    Breast cancer Paternal Aunt    Breast cancer Paternal Aunt    Breast cancer  Paternal Aunt    Colon cancer Neg Hx    Colon polyps Neg Hx    Esophageal cancer Neg Hx    Rectal cancer Neg Hx    Stomach cancer Neg Hx      SOCIAL HISTORY:  reports that she quit smoking about 33 years ago. Her smoking use included cigarettes. She has a 5.00 pack-year smoking history. She has never used smokeless tobacco. She reports current alcohol use of about 7.0 standard drinks of alcohol per week. She reports that she does not use drugs. The patient is married and lives in [place], [place]. She is accompanied by her husband. 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    ALLERGIES: Penicillins and Epinephrine   MEDICATIONS:  Current Outpatient Medications  Medication  Sig Dispense Refill   amitriptyline (ELAVIL) 10 MG tablet Take 3 tablets (30 mg total) by mouth at bedtime. 270 tablet 3   aspirin 81 MG EC tablet Take 1 tablet by mouth daily.     BIOTIN PO Take 5,000 mcg by mouth daily.     CALCIUM PO Take 1 tablet by mouth in the morning and at bedtime.     Glucosamine HCl-MSM (GLUCOSAMINE-MSM PO) Take 1 tablet by mouth daily.     Multiple Vitamin (MULTIVITAMIN) tablet Take 2 tablets by mouth daily.      Omega-3 Fatty Acids (OMEGA 3 PO) Take 600 mg by mouth daily.      oxyCODONE (OXY IR/ROXICODONE) 5 MG immediate release tablet Take 1 tablet (5 mg total) by mouth every 6 (six) hours as needed for severe pain. 5 tablet 0   sertraline (ZOLOFT) 25 MG tablet TAKE 1 TABLET (25 MG TOTAL) BY MOUTH DAILY. 90 tablet 1   TART CHERRY PO Take by mouth.     tretinoin (RETIN-A) 0.025 % cream Apply topically at bedtime.     No current facility-administered medications for this visit.     REVIEW OF SYSTEMS: On review of systems, the patient reports that she is doing well since surgery with minimal discomfort which she treats with tylenol with good relief. No other complaints are verbalized.      PHYSICAL EXAM:  Wt Readings from Last 3 Encounters:  05/26/22 156 lb 4.9 oz (70.9 kg)  04/22/22 152 lb 2 oz (69 kg)  03/04/22 151 lb (68.5 kg)   Temp Readings from Last 3 Encounters:  05/26/22 97.7 F (36.5 C) (Oral)  04/22/22 (!) 97.5 F (36.4 C) (Skin)  01/22/22 97.9 F (36.6 C) (Oral)   BP Readings from Last 3 Encounters:  05/26/22 (!) 150/90  04/22/22 116/70  01/22/22 100/76   Pulse Readings from Last 3 Encounters:  05/26/22 90  04/22/22 75  01/22/22 67    In general this is a well appearing caucasian female in no acute distress. She's alert and oriented x4 and appropriate throughout the examination. Cardiopulmonary assessment is negative for acute distress and she exhibits normal effort. Bilateral breast exam is deferred.    ECOG = 1  0 -  Asymptomatic (Fully active, able to carry on all predisease activities without restriction)  1 - Symptomatic but completely ambulatory (Restricted in physically strenuous activity but ambulatory and able to carry out work of a light or sedentary nature. For example, light housework, office work)  2 - Symptomatic, <50% in bed during the day (Ambulatory and capable of all self care but unable to carry out any work activities. Up and about more than 50% of waking hours)  3 -  Symptomatic, >50% in bed, but not bedbound (Capable of only limited self-care, confined to bed or chair 50% or more of waking hours)  4 - Bedbound (Completely disabled. Cannot carry on any self-care. Totally confined to bed or chair)  5 - Death   Eustace Pen MM, Creech RH, Tormey DC, et al. [phone]). "Toxicity and response criteria of the [facility]". [place] Oncol. 5 (6): 649-55    LABORATORY DATA:  Lab Results  Component Value Date   WBC 6.3 05/19/2019   HGB 15.4 (H) 05/19/2019   HCT 45.9 05/19/2019   MCV 94.4 05/19/2019   PLT 291.0 05/19/2019   Lab Results  Component Value Date   NA 138 04/17/2022   K 4.6 04/17/2022   CL 103 04/17/2022   CO2 28 04/17/2022   Lab Results  Component Value Date   ALT 41 (H) 04/17/2022   AST 36 04/17/2022   ALKPHOS 39 04/17/2022   BILITOT 0.4 04/17/2022      RADIOGRAPHY: MM Breast Surgical Specimen  Result Date: 05/26/2022 CLINICAL DATA:  Post lumpectomy specimen radiograph. EXAM: SPECIMEN RADIOGRAPH OF THE RIGHT BREAST COMPARISON:  Previous exam(s). FINDINGS: Status post excision of the right breast. The radioactive seed is present and completely intact. The biopsy marker clip is not seen within the specimen. IMPRESSION: Specimen radiograph of the right breast. These results were called by telephone at the time of interpretation on 05/26/2022 at 8:54 am to provider Stark Klein , who verbally acknowledged these results. Electronically Signed   By: Audie Pinto M.D.   On: 05/26/2022 08:54  MM RT RADIOACTIVE SEED LOC MAMMO GUIDE  Result Date: 05/21/2022 CLINICAL DATA:  72 year old female with recently diagnosed intraductal papilloma and atypical ductal hyperplasia post MRI guided biopsy of non mass enhancement in the medial right breast at site of barbell shaped biopsy marking clip. Patient presents for preoperative radioactive seed localization. EXAM: MAMMOGRAPHIC GUIDED RADIOACTIVE SEED LOCALIZATION OF THE RIGHT BREAST COMPARISON:  Previous exam(s). FINDINGS: Patient presents for radioactive seed localization prior to right breast excision. I met with the patient and we discussed the procedure of seed localization including benefits and alternatives. We discussed the high likelihood of a successful procedure. We discussed the risks of the procedure including infection, bleeding, tissue injury and further surgery. We discussed the low dose of radioactivity involved in the procedure. Informed, written consent was given. The usual time-out protocol was performed immediately prior to the procedure. Using mammographic guidance, sterile technique, 1% lidocaine and an I-125 radioactive seed, the dumbbell shaped biopsy marking clip was localized using a medial to lateral approach. The follow-up mammogram images confirm the seed in the expected location and were marked for Dr. Barry Dienes. Follow-up survey of the patient confirms presence of the radioactive seed. Order number of I-125 seed:  [id number]. Total activity:  6.295 millicuries reference Date: 04/17/2022 The patient tolerated the procedure well and was released from the [facility]. She was given instructions regarding seed removal. IMPRESSION: Radioactive seed localization right breast. No apparent complications. Electronically Signed   By: Everlean Alstrom M.D.   On: 05/21/2022 15:24      IMPRESSION/PLAN: 1. Low grade, prognostics unknown DCIS of the right breast. Dr. Lisbeth Renshaw discusses the pathology  findings and reviews the nature of early stage right breast disease. She has done well since lumpectomy. Dr. Lisbeth Renshaw discusses the rationale for external radiotherapy to the breast  to reduce risks of local recurrence. She will meet with Dr. Lindi Adie regarding the role of  antiestrogen therapy if her prognostics show ER positivity. Dr. Lisbeth Renshaw also discusses cases in which radiation may be optional for favorable cases based on her prognostic results.  We will follow-up with her final prognostic panel. We discussed the risks, benefits, short, and long term effects of radiotherapy, as well as the curative intent, and the patient is going to consider her options for treatment as we pursue ER testing results from pathology. Dr. Lisbeth Renshaw discusses the delivery and logistics of radiotherapy and anticipates a course of 4 weeks of radiotherapy to the right breast if she were to proceed. We will sign written consent at the time of simulation if she proceeds.   In a visit lasting 65 minutes, greater than 50% of the time was spent face to face reviewing her case, as well as in preparation of, discussing, and coordinating the patient's care.  The above documentation reflects my direct findings during this shared patient visit. Please see the separate note by Dr. Lisbeth Renshaw on this date for the remainder of the patient's plan of care.    Carola Rhine, [facility]    **Disclaimer: This note was dictated with voice recognition software. Similar sounding words can inadvertently be transcribed and this note may contain transcription errors which may not have been corrected upon publication of note.**

## 2022-06-04 NOTE — Telephone Encounter (Signed)
Spoke with pathology and requested prognostics to be ordered.

## 2022-06-05 LAB — SURGICAL PATHOLOGY

## 2022-06-08 ENCOUNTER — Telehealth: Payer: Self-pay | Admitting: Radiation Oncology

## 2022-06-08 DIAGNOSIS — D0511 Intraductal carcinoma in situ of right breast: Secondary | ICD-10-CM

## 2022-06-08 NOTE — Addendum Note (Signed)
Addended by: Carola Rhine on: 06/08/2022 03:51 PM   Modules accepted: Orders

## 2022-06-08 NOTE — Telephone Encounter (Signed)
I spoke with the patient by phone. She was quite dissatisfied with our encounter last week, and is interested in a second opinion. I appologized that she felt our encounter involved information overload and that we strive for our patients to understand their condition and situation, but have so much information to cover based on the nature of our treatments and how they are planned. I summarized the findings again with the patient and her husband also joining on the call, including the updated information from her prognostic panel showing that her cancer is ER positive and PR positive. With her clear margins, low grade tumor, and ER positivity, Dr. Lisbeth Renshaw has outlined the options of radiation with ultrahypofractionated course, versus forgoing radiation if she elects to take antiestrogen therapy. She was disappointed we did not have written information at the time of our consultation, but I suggested the [facility] website that she could consider reading about DCIS. She is also interested in a second opinion and I offered to coordinate that for her. She is interested in meeting Dr. Baruch Gouty in [place]. A new order was placed. She would like to keep her appointment as well with Dr. Lindi Adie. We will be available as needed moving forward, and would be happy to see her back if she wants to pursue radiation with Dr. Lisbeth Renshaw.

## 2022-06-08 NOTE — Telephone Encounter (Signed)
I called the patient back after she had left a message with our staff last week about questions she had. Her pathology has an addendum showing her DCIS as ER and PR both being positive. I had to leave a message however asking her to call back to discuss.

## 2022-06-11 ENCOUNTER — Telehealth: Payer: Self-pay | Admitting: Radiation Oncology

## 2022-06-11 NOTE — Telephone Encounter (Signed)
I called the patient back after she left a voicemail with questions. We reviewed clinical trial information on forgoing radiation in early stage cancer, and the [facility] DCISC nomogram, as well as discussed upcoming plans for a visit with Dr. Baruch Gouty. We will be available to the patient as needed in the future.

## 2022-06-15 NOTE — Progress Notes (Signed)
Location of Breast Cancer: Ductal carcinoma in situ (DCIS) of right breast   Patient is a 72 year old female who developed mild nipple inversion and decreased sensitivity of the nipple around February of 2022. She had diagnostic imaging and there was no findings. She has a known Chek 2 mutation and gets alternating mammograms with MRIs. In September 2023, she noticed that the nipple inversion was dramatically different and that her diagnostic mammogram moved up. Her diagnostic mammogram and ultrasound showed only benign findings. She has known breast cysts but these were not mentioned in this report. She had a BI-RADS 1 reading of her diagnostic imaging performed in October 2023. She subsequently underwent her breast MRI and this was shown to have 3.3 cm of non-mass loosely grouped enhancement. She did have 2 adjacent cysts consistent with proteinaceous cyst. She had no masses seen. She had a biopsy of the non-mass enhancement and was seen to have an intraductal papilloma with atypical ductal hyperplasia. Patient states that after the hematoma resolved from her biopsy, the nipple inversion almost completely resolved.  Of note, she does have significant family history of cancer. She has 4 paternal aunts who had breast cancer.  The patient does work out with lifting weights and using a stationary bike.   Histology per Pathology Report:  FINAL MICROSCOPIC DIAGNOSIS:  A.   BREAST, RIGHT, LUMPECTOMY: - Ductal carcinoma in situ - Intraductal papilloma - Negative for invasive carcinoma -Margins are negative for carcinoma in situ or invasive carcinoma -See oncology table  B.   BREAST, RIGHT ADDITIONAL POSTERIOR LATERAL MARGIN, EXCISION: - Negative for carcinoma in situ or invasive carcinoma  C.   BREAST, RIGHT ADDITIONAL INFERIOR MARGIN, EXCISION: - Negative for carcinoma in situ or invasive carcinoma  D.   BREAST, RIGHT ADDITIONAL SUPERIOR MARGIN, EXCISION: - Negative for carcinoma in situ or  invasive carcinoma  E.   BREAST, RIGHT ADDITIONAL MEDIAL MARGIN, EXCISION: - Negative for carcinoma in situ or invasive carcinoma  F.   BREAST, RIGHT ADDITIONAL ANTERIOR MARGIN, EXCISION: - Negative for carcinoma in situ or invasive carcinoma  ONCOLOGY TABLE:  DCIS OF THE BREAST:  Resection Procedure: Lumpectomy Specimen Laterality: Right Histologic Type: Ductal carcinoma in situ Size of DCIS: 0.2 cm Nuclear Grade: Low grade Necrosis: Not identified Margins: All margins negative for DCIS      Specify Closest Margin (required only if <55m): 3 mm, anterior Regional Lymph Nodes: Not applicable      Breast Biomarker Testing: Will be performed and reported in an addendum Pathologic Stage Classification (pTNM, AJCC 8th Edition): pTis, pN[not assigned] Representative Tumor Block: A7  (v4.4.0.0)  GROSS DESCRIPTION:  A: Specimen type: Right breast seed localization lumpectomy, received fresh.  The specimen is placed in formalin at 9:15 AM on 05/26/2022. Size: 3.7 cm at the superior-inferior axis, 3.2 cm at the anterior-posterior axis and 1.7 cm at the lateral-medial axis. Orientation: The specimen is oriented with previously applied inks (anterior green, inferior blue, lateral orange, medial yellow, posterior black, superior red). Localized area: The localization seed is identified at the time of receipt. Cut surface: At the localized area there is a 2.0 x 1.8 x 1.5 cm area of hemorrhage without a discrete mass identified.  A biopsy clip is not present.  The breast tissue consists of soft yellow adipose tissue and focally dense white fibrous tissue. Margins: The hemorrhage extends to the lateral, medial and posterior margins.  The remaining margins measure greater than 1 cm. Prognostic indicators: Obtained from paraffin blocks if needed. Block  summary: 7 blocks submitted 1-5 = entire area of hemorrhage to include the posterior, lateral and medial margins 6 = anterior  margin 7 = superior and inferior margins  B: Received fresh is a 3.9 x 2.5 x 1.8 cm portion of fibroadipose tissue, clinically right breast additional posterior/lateral margin. The posterior margin is previously inked black and lateral margin previously inked yellow.  The cut surfaces consist of soft yellow adipose tissue and focally dense white fibrous tissue.  A biopsy clip is not present.  There are 2 disrupted cysts measuring 0.8 and 1 cm without contents.  Sections are submitted in 8 cassettes.  C: Received fresh is a 3.2 x 2.5 x 1 cm portion of fibroadipose tissue, clinically right breast additional inferior margin.  The inferior margin is previously inked blue.  The cut surfaces consist of soft yellow adipose tissue and focally dense white fibrous tissue.  A biopsy clip is not present.  The specimen is sectioned and entirely submitted in 5 cassettes.  D: Received fresh is a 3.8 x 3.5 x 1.7 cm portion of fibroadipose tissue, clinically right breast additional superior margin.  The superior margin is previously inked red.  The cut surfaces consist of soft yellow adipose tissue and white fibrous tissue.  A biopsy clip is not present.  Sections are submitted in 5 cassettes.  E: Received fresh is a 3.5 x 3 x 1 cm portion of fibroadipose tissue, clinically right breast additional medial margin.  The medial margin is previously inked yellow.  The cut surfaces consist of soft yellow adipose tissue and white fibrous tissue.  A biopsy clip is not present. Sections are submitted in 6 cassettes.  F: Received fresh is a 3.9 x 2.5 x 1.5 cm portion of fibroadipose tissue, clinically right breast additional anterior margin.  The anterior margin is previously inked green.  The cut surfaces consist of soft yellow adipose tissue and white fibrous tissue.  There is focal hemorrhage present along 1 surface.  A biopsy clip is not present. Sections are submitted in 6 cassettes.  [facility]  05/27/2022)  Final Diagnosis performed by Tilford Pillar, MD.   Electronically signed 06/02/2022    Receptor Status: ER(Pending), PR (Pending),  Did patient present with symptoms (if so, please note symptoms) or was this found on screening mammography?: nipple changes (inversion)  Past/Anticipated interventions by surgeon, if any: 05-26-22 Right Breast Radioactive seed localized excisional biopsy   Indications: This patient presents with history of abnormal right mammogram with discordant core needle biopsy.  (ADH)  Pre-operative Diagnosis: abnormal right mammogram     Post-operative Diagnosis: abnormal right mammogram   Surgeon: Stark Klein    Anesthesia: General endotracheal anesthesia    Past/Anticipated interventions by medical oncology, if any:  Pt to see Dr. Lindi Adie on 06-23-22  Lymphedema issues, if any:  none  Pain issues, if any:  none  SAFETY ISSUES: Prior radiation? none Pacemaker/ICD? none Possible current pregnancy? none Is the patient on methotrexate? none  Current Complaints / other details:  no major concerns  Vitals:   06/17/22 1005  BP: (!) 137/91  Pulse: 76  Resp: 18  Temp: (!) 97.3 F (36.3 C)  SpO2: 100%

## 2022-06-16 NOTE — Progress Notes (Signed)
Radiation Oncology         [phone] ________________________________  Outpatient Re-Consultation  Name: Rachael Sharp MRN: [id number]  Date: 06/17/2022  DOB: [birth year]  VP:[id number], Mervyn Gay, MD  Stark Klein, MD   REFERRING PHYSICIAN: Stark Klein, MD  DIAGNOSIS:    ICD-10-CM   1. Ductal carcinoma in situ (DCIS) of right breast  D05.11       Stage 0 (cTis (DCIS), cN0, cM0) Right Breast, Low-grade DCIS, ER+ / PR+ / Her2 not assessed  CHIEF COMPLAINT: Here to discuss management of right breast DCIS  HISTORY OF PRESENT ILLNESS::Rachael Sharp is a 72 y.o. female who is seen at the request of Dr. Barry Dienes for an opinion concerning radiation therapy in management of her recently diagnosed right breast DCIS. Of note: the patient was initially seen in consultation by Dr. Lisbeth Renshaw on 06/04/22. The patient also has a CHEK2 monoallelic mutation.       This past October 2023, the patient presented with progressive right nipple inversion. This prompted a diagnostic bilateral breast mammogram and right breast ultrasound on 02/20/22 which showed no mammographic or sonographic evidence of malignancy in the right breast or other finding to explain the patient's progressive right nipple inversion. Mammogram also showed no evidence of malignancy in the left breast.   However, a bilateral breast MRI performed on 02/23/22 showed a loosely grouped area of non mass enhancement in the medial retroareolar to central right breast spanning an area measuring 3.3 x 2.0 x 2.6 cm. No abnormal lymph nodes were appreciated in either axilla.  Biopsy of the medial right breast on 02/27/22 showed findings consistent with intraductal papilloma and atypical ductal hyperplasia.    The patient was then referred to Dr. Barry Dienes and opted to proceed with a right breast lumpectomy on 05/26/22. Pathology from the procedure revealed: tumor the size of 0.2 cm; histology of low grade DCIS and intraductal papilloma; all  margins negative for DCIS; margin status to in-situ disease of 3 mm from the anterior margin; no lymph nodes were examined. ER status: 100% positive and PR status 100% positive, both with strong staining intensity; Her2 not assessed.  The patient is scheduled to meet with Dr. Lindi Adie on 06/23/22 to discuss antiestrogen treatment options.   She was previously seen in our clinic by seen by Dr Kyung Rudd and Shona Simpson, PA.  Given her difficulty in making a decision she wanted to meet with me about a second opinion regarding radiation therapy  PREVIOUS RADIATION THERAPY: No  PAST MEDICAL HISTORY:  has a past medical history of Anxiety, Arthritis, Breast cancer ([place]) (05/26/2022), GERD (gastroesophageal reflux disease), Heart murmur, Hyperlipidemia, Hyperplastic colon polyp, Monoallelic mutation of CHEK2 gene in female patient, and Vitamin D deficiency.    PAST SURGICAL HISTORY: Past Surgical History:  Procedure Laterality Date   ABDOMINAL HYSTERECTOMY  12/2000   BREAST BIOPSY     biopsy x 2 , 1976 and 2002   BREAST BIOPSY  05/21/2022   MM RT RADIOACTIVE SEED LOC MAMMO GUIDE 05/21/2022 [facility]   BREAST CYST ASPIRATION     COLONOSCOPY  11/03/2018   Dr.Pyrtle   COLONOSCOPY  2017   LIPOMA EXCISION  1983   RADIOACTIVE SEED GUIDED EXCISIONAL BREAST BIOPSY Right 05/26/2022   Procedure: RADIOACTIVE SEED GUIDED EXCISIONAL RIGHT BREAST BIOPSY;  Surgeon: Stark Klein, MD;  Location: [facility];  Service: General;  Laterality: Right;   [place]    FAMILY HISTORY:  family history includes Breast cancer in her paternal aunt, paternal aunt, paternal aunt, and paternal aunt; COPD in her mother and sister; Heart disease in her father; Heart disease (age of onset: 32) in her sister; Hyperlipidemia in her father; Stroke in her mother and sister.  SOCIAL HISTORY:  reports that she quit smoking about 33 years ago. Her smoking use included cigarettes.  She has a 5.00 pack-year smoking history. She has never used smokeless tobacco. She reports current alcohol use of about 7.0 standard drinks of alcohol per week. She reports that she does not use drugs.  ALLERGIES: Penicillins and Epinephrine  MEDICATIONS:  Current Outpatient Medications  Medication Sig Dispense Refill   amitriptyline (ELAVIL) 10 MG tablet Take 3 tablets (30 mg total) by mouth at bedtime. 270 tablet 3   aspirin 81 MG EC tablet Take 1 tablet by mouth daily.     BIOTIN PO Take 5,000 mcg by mouth daily.     CALCIUM PO Take 1 tablet by mouth in the morning and at bedtime.     Glucosamine HCl-MSM (GLUCOSAMINE-MSM PO) Take 1 tablet by mouth daily.     Multiple Vitamin (MULTIVITAMIN) tablet Take 2 tablets by mouth daily.      Omega-3 Fatty Acids (OMEGA 3 PO) Take 600 mg by mouth daily.      sertraline (ZOLOFT) 25 MG tablet TAKE 1 TABLET (25 MG TOTAL) BY MOUTH DAILY. 90 tablet 1   tretinoin (RETIN-A) 0.025 % cream Apply topically at bedtime.     acetaminophen (TYLENOL) 325 MG tablet Take 650 mg by mouth every 6 (six) hours as needed for moderate pain or mild pain.     No current facility-administered medications for this encounter.    REVIEW OF SYSTEMS: As above in HPI.   PHYSICAL EXAM:  height is 5' 4.75" (1.645 m) and weight is 159 lb 6.4 oz (72.3 kg). Her temperature is 97.3 F (36.3 C) (abnormal). Her blood pressure is 137/91 (abnormal) and her pulse is 76. Her respiration is 18 and oxygen saturation is 100%.   General: Alert and oriented, in no acute distress.  Well-appearing.  Further exam deferred   ECOG = 0  0 - Asymptomatic (Fully active, able to carry on all predisease activities without restriction)  1 - Symptomatic but completely ambulatory (Restricted in physically strenuous activity but ambulatory and able to carry out work of a light or sedentary nature. For example, light housework, office work)  2 - Symptomatic, <50% in bed during the day (Ambulatory and  capable of all self care but unable to carry out any work activities. Up and about more than 50% of waking hours)  3 - Symptomatic, >50% in bed, but not bedbound (Capable of only limited self-care, confined to bed or chair 50% or more of waking hours)  4 - Bedbound (Completely disabled. Cannot carry on any self-care. Totally confined to bed or chair)  5 - Death   Eustace Pen MM, Creech RH, Tormey DC, et al. [phone]). "Toxicity and response criteria of the [employer] [facility] [employer] [facility]". [place] Oncol. 5 (6): 649-55   LABORATORY DATA:  Lab Results  Component Value Date   WBC 6.3 05/19/2019   HGB 15.4 (H) 05/19/2019   HCT 45.9 05/19/2019   MCV 94.4 05/19/2019   PLT 291.0 05/19/2019   CMP     Component Value Date/Time   NA 138 04/17/2022 0859   K 4.6 04/17/2022 0859   CL 103 04/17/2022 0859   CO2 28 04/17/2022 0859  GLUCOSE 109 (H) 04/17/2022 0859   BUN 14 04/17/2022 0859   CREATININE 1.04 04/17/2022 0859   CALCIUM 9.6 04/17/2022 0859   PROT 6.7 04/17/2022 0859   ALBUMIN 4.3 04/17/2022 0859   AST 36 04/17/2022 0859   ALT 41 (H) 04/17/2022 0859   ALKPHOS 39 04/17/2022 0859   BILITOT 0.4 04/17/2022 0859         RADIOGRAPHY: MM Breast Surgical Specimen  Result Date: 05/26/2022 CLINICAL DATA:  Post lumpectomy specimen radiograph. EXAM: SPECIMEN RADIOGRAPH OF THE RIGHT BREAST COMPARISON:  Previous exam(s). FINDINGS: Status post excision of the right breast. The radioactive seed is present and completely intact. The biopsy marker clip is not seen within the specimen. IMPRESSION: Specimen radiograph of the right breast. These results were called by telephone at the time of interpretation on 05/26/2022 at 8:54 am to provider Stark Klein , who verbally acknowledged these results. Electronically Signed   By: Audie Pinto M.D.   On: 05/26/2022 08:54  MM RT RADIOACTIVE SEED LOC MAMMO GUIDE  Result Date: 05/21/2022 CLINICAL DATA:  72 year old female with recently diagnosed  intraductal papilloma and atypical ductal hyperplasia post MRI guided biopsy of non mass enhancement in the medial right breast at site of barbell shaped biopsy marking clip. Patient presents for preoperative radioactive seed localization. EXAM: MAMMOGRAPHIC GUIDED RADIOACTIVE SEED LOCALIZATION OF THE RIGHT BREAST COMPARISON:  Previous exam(s). FINDINGS: Patient presents for radioactive seed localization prior to right breast excision. I met with the patient and we discussed the procedure of seed localization including benefits and alternatives. We discussed the high likelihood of a successful procedure. We discussed the risks of the procedure including infection, bleeding, tissue injury and further surgery. We discussed the low dose of radioactivity involved in the procedure. Informed, written consent was given. The usual time-out protocol was performed immediately prior to the procedure. Using mammographic guidance, sterile technique, 1% lidocaine and an I-125 radioactive seed, the dumbbell shaped biopsy marking clip was localized using a medial to lateral approach. The follow-up mammogram images confirm the seed in the expected location and were marked for Dr. Barry Dienes. Follow-up survey of the patient confirms presence of the radioactive seed. Order number of I-125 seed:  [id number]. Total activity:  0.867 millicuries reference Date: 04/17/2022 The patient tolerated the procedure well and was released from the [facility]. She was given instructions regarding seed removal. IMPRESSION: Radioactive seed localization right breast. No apparent complications. Electronically Signed   By: Everlean Alstrom M.D.   On: 05/21/2022 15:24     IMPRESSION/PLAN: This is a wonderful 72 year old woman with low-grade estrogen sensitive DCIS.  The extent of DCIS was 2 mm and she has clear margins.  She has not yet met with medical oncology but she is open to the idea of taking antiestrogen therapy for years.  I let her know that  given her concerns regarding her CHEK2 mutation and risk of breast cancers later in life, it is possible that antiestrogen therapy could help mitigate the risk of cancer in her bilateral breasts.  Certainly, I defer to Dr. Lindi Adie regarding his expertise on this issue and she will ask about that further.  I also explained that radiation therapy is beneficial to minimize the risk of her recent DCIS from relapsing in her right breast, but it's unlikely to play any significant role in preventing other cases of breast cancer or DCIS that have not yet declared themselves in her bilateral breast tissue.  Regarding her recent DCIS diagnosis, she understands that she  has relatively low risk DCIS.  We went through various iterations of the [place] and she understands that should she take antiestrogen therapy, her risk of a recurrence of this DCIS in her right breast over the next decade would be approximately 6%.  That risk would be reduced further to approximately 2% if she adds radiation therapy to the antiestrogen therapy.  With radiation therapy alone to the right breast, the risk of relapse in the right breast for this particular DCIS over the next decade would be 4%.  I reiterated to her that these risks pertain only to her current case of DCIS relapse and are different than lifetime risks of bilateral breast cancer related to her CHEK2 mutation.  She and I agree that a 6% risk over the next decade is quite low and that it is quite reasonable for her to take antiestrogen therapy and therapy alone.  That being said, some patients want to do everything available to minimize the risk of a recurrence and if she will feel more at peace pursuing radiation therapy to bring her risk down even further, I would recommend a 3-week regimen of radiation therapy to her breast.  While a boost is not unreasonable, my approach is to defer a boost in patients with disease that is as low risk as  hers.  She seems to be leaning against radiation therapy but would like to talk with Dr. Lindi Adie before she makes her final decision.  I encouraged her to call us if we can be of help in any way in the future.  She and her husband expressed gratitude for our meeting today.  Of note, she also had good questions regarding MRI screening given her CHEK2 mutation.  We did discuss some of the pros and cons of MRI screening and she is going to discuss this further with Dr. Lindi Adie.    On date of service, in total, I spent 55 minutes on this encounter. Patient was seen in person.   __________________________________________   Eppie Gibson, MD  This document serves as a record of services personally performed by Eppie Gibson, MD. It was created on her behalf by Roney Mans, a trained medical scribe. The creation of this record is based on the scribe's personal observations and the provider's statements to them. This document has been checked and approved by the attending provider.

## 2022-06-17 ENCOUNTER — Ambulatory Visit
Admission: RE | Admit: 2022-06-17 | Discharge: 2022-06-17 | Disposition: A | Discharge status: Home / Self Care | Payer: Medicare Other | Source: Ambulatory Visit | Attending: Radiation Oncology | Admitting: Radiation Oncology

## 2022-06-17 ENCOUNTER — Encounter: Payer: Self-pay | Admitting: Radiation Oncology

## 2022-06-17 ENCOUNTER — Other Ambulatory Visit: Payer: Self-pay

## 2022-06-17 VITALS — BP 137/91 | HR 76 | Temp 97.3°F | Resp 18 | Ht 64.75 in | Wt 159.4 lb

## 2022-06-17 DIAGNOSIS — R011 Cardiac murmur, unspecified: Secondary | ICD-10-CM | POA: Diagnosis not present

## 2022-06-17 DIAGNOSIS — E559 Vitamin D deficiency, unspecified: Secondary | ICD-10-CM | POA: Insufficient documentation

## 2022-06-17 DIAGNOSIS — Z803 Family history of malignant neoplasm of breast: Secondary | ICD-10-CM | POA: Diagnosis not present

## 2022-06-17 DIAGNOSIS — Z1501 Genetic susceptibility to malignant neoplasm of breast: Secondary | ICD-10-CM | POA: Diagnosis not present

## 2022-06-17 DIAGNOSIS — M129 Arthropathy, unspecified: Secondary | ICD-10-CM | POA: Diagnosis not present

## 2022-06-17 DIAGNOSIS — Z7982 Long term (current) use of aspirin: Secondary | ICD-10-CM | POA: Insufficient documentation

## 2022-06-17 DIAGNOSIS — E785 Hyperlipidemia, unspecified: Secondary | ICD-10-CM | POA: Insufficient documentation

## 2022-06-17 DIAGNOSIS — K219 Gastro-esophageal reflux disease without esophagitis: Secondary | ICD-10-CM | POA: Insufficient documentation

## 2022-06-17 DIAGNOSIS — Z87891 Personal history of nicotine dependence: Secondary | ICD-10-CM | POA: Diagnosis not present

## 2022-06-17 DIAGNOSIS — D0511 Intraductal carcinoma in situ of right breast: Secondary | ICD-10-CM | POA: Diagnosis not present

## 2022-06-17 DIAGNOSIS — Z86010 Personal history of colonic polyps: Secondary | ICD-10-CM | POA: Insufficient documentation

## 2022-06-17 DIAGNOSIS — Z170 Estrogen receptor positive status [ER+]: Secondary | ICD-10-CM | POA: Diagnosis not present

## 2022-06-17 DIAGNOSIS — Z79899 Other long term (current) drug therapy: Secondary | ICD-10-CM | POA: Diagnosis not present

## 2022-06-18 ENCOUNTER — Ambulatory Visit: Payer: Medicare Other | Admitting: Radiation Oncology

## 2022-06-19 DIAGNOSIS — L2389 Allergic contact dermatitis due to other agents: Secondary | ICD-10-CM | POA: Diagnosis not present

## 2022-06-21 ENCOUNTER — Emergency Department: Payer: Medicare Other

## 2022-06-21 ENCOUNTER — Emergency Department
Admission: EM | Admit: 2022-06-21 | Discharge: 2022-06-21 | Disposition: A | Discharge status: Home / Self Care | Payer: Medicare Other | Attending: Student in an Organized Health Care Education/Training Program | Admitting: Student in an Organized Health Care Education/Training Program

## 2022-06-21 ENCOUNTER — Other Ambulatory Visit: Payer: Self-pay

## 2022-06-21 DIAGNOSIS — S199XXA Unspecified injury of neck, initial encounter: Secondary | ICD-10-CM | POA: Diagnosis not present

## 2022-06-21 DIAGNOSIS — M4802 Spinal stenosis, cervical region: Secondary | ICD-10-CM | POA: Diagnosis not present

## 2022-06-21 DIAGNOSIS — M47812 Spondylosis without myelopathy or radiculopathy, cervical region: Secondary | ICD-10-CM | POA: Diagnosis not present

## 2022-06-21 DIAGNOSIS — E041 Nontoxic single thyroid nodule: Secondary | ICD-10-CM | POA: Diagnosis not present

## 2022-06-21 DIAGNOSIS — S40021A Contusion of right upper arm, initial encounter: Secondary | ICD-10-CM | POA: Insufficient documentation

## 2022-06-21 DIAGNOSIS — W01198A Fall on same level from slipping, tripping and stumbling with subsequent striking against other object, initial encounter: Secondary | ICD-10-CM | POA: Diagnosis not present

## 2022-06-21 DIAGNOSIS — S0083XA Contusion of other part of head, initial encounter: Secondary | ICD-10-CM | POA: Insufficient documentation

## 2022-06-21 DIAGNOSIS — W19XXXA Unspecified fall, initial encounter: Secondary | ICD-10-CM

## 2022-06-21 DIAGNOSIS — M7981 Nontraumatic hematoma of soft tissue: Secondary | ICD-10-CM | POA: Diagnosis not present

## 2022-06-21 DIAGNOSIS — S0993XA Unspecified injury of face, initial encounter: Secondary | ICD-10-CM | POA: Diagnosis not present

## 2022-06-21 NOTE — ED Triage Notes (Signed)
Pt reports got out of the be in the dark to use the bathroom and tripped over her own feet and fell hitting her face on the tile of the shower. Pt with bruising and swelling to right side of face. Denies LOC. Reports pain is from swelling and not bone. Reports full RO of neck and extremities with no pain. Reports has bruise to right upper arm as well.

## 2022-06-21 NOTE — ED Provider Notes (Signed)
[facility] Provider Note    Event Date/Time   First MD Initiated Contact with Patient 06/21/22 1101     (approximate)   History   Fall   HPI  Rachael Sharp is a 72 y.o. female who presents to the ER for evaluation of fall.  Patient had a mechanical fall last night when she was getting up to use the restroom.  Was in a dark room somehow got tripped up L and hit tile in the shower.  No LOC.  She was able to get up and go back to bed.  Denies any numbness or tingling.  Denies any other associated injury other than some bruising to her upper arm but able to fully move it no bony pain.  She is not on any blood thinners.     Physical Exam   Triage Vital Signs: ED Triage Vitals  Enc Vitals Group     BP 06/21/22 1011 137/77     Pulse Rate 06/21/22 1011 93     Resp 06/21/22 1011 18     Temp 06/21/22 1011 98.1 F (36.7 C)     Temp Source 06/21/22 1011 Oral     SpO2 06/21/22 1011 98 %     Weight 06/21/22 1009 158 lb 11.7 oz (72 kg)     Height 06/21/22 1009 5' 4"$  (1.626 m)     Head Circumference --      Peak Flow --      Pain Score 06/21/22 1009 3     Pain Loc --      Pain Edu? --      Excl. in [place]? --     Most recent vital signs: Vitals:   06/21/22 1011  BP: 137/77  Pulse: 93  Resp: 18  Temp: 98.1 F (36.7 C)  SpO2: 98%     Constitutional: Alert  Eyes: Conjunctivae are normal.  Head: Atraumatic. Nose: No congestion/rhinnorhea. Mouth/Throat: Mucous membranes are moist.   Neck: Painless ROM.  Cardiovascular:   Good peripheral circulation. Respiratory: Normal respiratory effort.  No retractions.  Gastrointestinal: Soft and nontender.  Musculoskeletal:  no deformity Neurologic:  MAE spontaneously. No gross focal neurologic deficits are appreciated.  Skin:  Skin is warm, dry and intact. No rash noted. Psychiatric: Mood and affect are normal. Speech and behavior are normal.    ED Results / Procedures / Treatments   Labs (all labs  ordered are listed, but only abnormal results are displayed) Labs Reviewed - No data to display   EKG     RADIOLOGY Please see ED Course for my review and interpretation.  I personally reviewed all radiographic images ordered to evaluate for the above acute complaints and reviewed radiology reports and findings.  These findings were personally discussed with the patient.  Please see medical record for radiology report.    PROCEDURES:  Critical Care performed: No  Procedures   MEDICATIONS ORDERED IN ED: Medications - No data to display   IMPRESSION / MDM / [place] / ED COURSE  I reviewed the triage vital signs and the nursing notes.                              Differential diagnosis includes, but is not limited to, fracture, contusion, SDH, SAH, IPH, contusion  Patient presented to the ER for evaluation of symptoms as described above.   Clinical Course as of 06/21/22 1201  Sun Jun 21, 2022  1123 CT imaging on my review and interpretation without evidence of fracture. [PR]  [id number] Imaging is reassuring.  Fracture.  Does have hematoma.  No indication for further diagnostic testing.  Does appear stable and appropriate for outpatient follow-up. [PR]    Clinical Course User Index [PR] Merlyn Lot, MD    FINAL CLINICAL IMPRESSION(S) / ED DIAGNOSES   Final diagnoses:  Fall, initial encounter  Contusion of face, initial encounter     Rx / DC Orders   ED Discharge Orders     None        Note:  This document was prepared using Dragon voice recognition software and may include unintentional dictation errors.    Merlyn Lot, MD 06/21/22 1201

## 2022-06-23 ENCOUNTER — Inpatient Hospital Stay: Payer: Medicare Other | Attending: Hematology and Oncology | Admitting: Hematology and Oncology

## 2022-06-23 ENCOUNTER — Other Ambulatory Visit: Payer: Self-pay

## 2022-06-23 VITALS — BP 153/98 | HR 84 | Temp 97.7°F | Resp 16 | Wt 158.6 lb

## 2022-06-23 DIAGNOSIS — Z79899 Other long term (current) drug therapy: Secondary | ICD-10-CM | POA: Diagnosis not present

## 2022-06-23 DIAGNOSIS — Z87891 Personal history of nicotine dependence: Secondary | ICD-10-CM | POA: Insufficient documentation

## 2022-06-23 DIAGNOSIS — Z1501 Genetic susceptibility to malignant neoplasm of breast: Secondary | ICD-10-CM | POA: Insufficient documentation

## 2022-06-23 DIAGNOSIS — D0511 Intraductal carcinoma in situ of right breast: Secondary | ICD-10-CM

## 2022-06-23 DIAGNOSIS — Z1509 Genetic susceptibility to other malignant neoplasm: Secondary | ICD-10-CM | POA: Diagnosis not present

## 2022-06-23 DIAGNOSIS — Z7982 Long term (current) use of aspirin: Secondary | ICD-10-CM | POA: Insufficient documentation

## 2022-06-23 MED ORDER — TAMOXIFEN CITRATE 10 MG PO TABS: 10.0000 mg | ORAL_TABLET | Freq: Every day | ORAL | 3 refills | Status: DC

## 2022-06-23 NOTE — Assessment & Plan Note (Addendum)
05/26/2022: Right lumpectomy: 0.2 cm low-grade DCIS, intraductal papilloma, margins negative, ER 100%, PR 100% CHEK2 gene mutation  Pathology review: I discussed with the patient the difference between DCIS and invasive breast cancer. It is considered a precancerous lesion. DCIS is classified as a 0. It is generally detected through mammograms as calcifications. We discussed the significance of grades and its impact on prognosis. We also discussed the importance of ER and PR receptors and their implications to adjuvant treatment options. Prognosis of DCIS dependence on grade, comedo necrosis. It is anticipated that if not treated, 20-30% of DCIS can develop into invasive breast cancer.  Recommendation: 1.adjuvant radiation therapy (after discussing pros and cons of radiation she decided to forego radiation) 2. Followed by antiestrogen therapy with tamoxifen 5 years  CHEK 2 mutation: Based on NCCN guidelines, this is a pathogenic mutation that has been associated with risk of not only breast cancer but also colon, thyroid and kidney cancers.   Tamoxifen counseling: We discussed the risks and benefits of tamoxifen. These include but not limited to insomnia, hot flashes, mood changes, vaginal dryness, and weight gain. Although rare, serious side effects including risk of blood clots were also discussed. We strongly believe that the benefits far outweigh the risks. Patient understands these risks and consented to starting treatment. Planned treatment duration is 5 years.  We will start her at 10 mg daily.  Briefly discussed the interaction between Zoloft and tamoxifen.  We felt that it would be fine to continue with the Zoloft.  Return to clinic after surgery to discuss the final pathology report and come up with an adjuvant treatment plan.

## 2022-06-23 NOTE — Progress Notes (Signed)
[facility] CONSULT NOTE  Patient Care Team: Jinny Sanders, MD as PCP - General (Family Medicine)  CHIEF COMPLAINTS/PURPOSE OF CONSULTATION:  Newly diagnosed breast cancer  HISTORY OF PRESENTING ILLNESS:  Rachael Sharp 72 y.o. female is here because of recent diagnosis of right breast DCIS.  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  She has been getting mammograms and MRI surveillance.  On the most recent MRI in September she was noted to have an abnormality which was not visible on mammograms.  She underwent MRI guided biopsy and subsequent underwent lumpectomy.  She was referred to [place] for discussion regarding adjuvant treatment options.  She met with Dr. Isidore Moos and felt that she does not want to do radiation.  She is here today to talk about the entire treatment plan.  She is accompanied by her husband.  She fell at home and bruised her right orbit area.  I reviewed her records extensively and collaborated the history with the patient.  SUMMARY OF ONCOLOGIC HISTORY: Oncology History  Ductal carcinoma in situ (DCIS) of right breast  05/26/2022 Initial Diagnosis   Right lumpectomy: 0.2 cm low-grade DCIS, intraductal papilloma, margins negative, ER 100%, PR 100%      MEDICAL HISTORY:  Past Medical History:  Diagnosis Date   Anxiety    Arthritis    finger left index   Breast cancer ([place]) 05/26/2022   GERD (gastroesophageal reflux disease)    Heart murmur    mitral valve prolapse   Hyperlipidemia    denies 10/21/18,not on medication   Hyperplastic colon polyp    Monoallelic mutation of CHEK2 gene in female patient    Vitamin D deficiency     SURGICAL HISTORY: Past Surgical History:  Procedure Laterality Date   ABDOMINAL HYSTERECTOMY  12/2000   BREAST BIOPSY     biopsy x 2 , 1976 and 2002   BREAST BIOPSY  05/21/2022   MM RT RADIOACTIVE SEED LOC MAMMO GUIDE 05/21/2022 [facility]   BREAST CYST ASPIRATION     COLONOSCOPY  11/03/2018    Dr.Pyrtle   COLONOSCOPY  2017   LIPOMA EXCISION  1983   RADIOACTIVE SEED GUIDED EXCISIONAL BREAST BIOPSY Right 05/26/2022   Procedure: RADIOACTIVE SEED GUIDED EXCISIONAL RIGHT BREAST BIOPSY;  Surgeon: Stark Klein, MD;  Location: [facility];  Service: General;  Laterality: Right;   TONSILLECTOMY  1964   TUBAL LIGATION  1982    SOCIAL HISTORY: Social History   Socioeconomic History   Marital status: Married    Spouse name: Not on file   Number of children: Not on file   Years of education: Not on file   Highest education level: Not on file  Occupational History   Occupation: retired  Tobacco Use   Smoking status: Former    Packs/day: 0.25    Years: 20.00    Total pack years: 5.00    Types: Cigarettes    Quit date: 06/02/1989    Years since quitting: 33.0   Smokeless tobacco: Never   Tobacco comments:    Quit 1991  Vaping Use   Vaping Use: Never used  Substance and Sexual Activity   Alcohol use: Yes    Alcohol/week: 7.0 standard drinks of alcohol    Types: 7 Standard drinks or equivalent per week    Comment: nightly wine   Drug use: No   Sexual activity: Not Currently    Partners: Male    Comment: 1st intercourse-19, partners- 4,  married- 77 yr s  Other Topics Concern   Not on file  Social History Narrative   Not on file   Social Determinants of Health   Financial Resource Strain: Low Risk  (03/04/2022)   Overall Financial Resource Strain (CARDIA)    Difficulty of Paying Living Expenses: Not hard at all  Food Insecurity: No Food Insecurity (06/04/2022)   Hunger Vital Sign    Worried About Running Out of Food in the Last Year: Never true    Ran Out of Food in the Last Year: Never true  Transportation Needs: No Transportation Needs (06/04/2022)   PRAPARE - Hydrologist (Medical): No    Lack of Transportation (Non-Medical): No  Physical Activity: Sufficiently Active (03/04/2022)   Exercise Vital Sign    Days of  Exercise per Week: 5 days    Minutes of Exercise per Session: 90 min  Stress: No Stress Concern Present (03/04/2022)   [place]    Feeling of Stress : Only a little  Social Connections: Moderately Isolated (03/04/2022)   Social Connection and Isolation Panel [NHANES]    Frequency of Communication with Friends and Family: More than three times a week    Frequency of Social Gatherings with Friends and Family: Once a week    Attends Religious Services: Never    Marine scientist or Organizations: No    Attends Archivist Meetings: Never    Marital Status: Married  Human resources officer Violence: Not At Risk (06/04/2022)   Humiliation, Afraid, Rape, and Kick questionnaire    Fear of Current or Ex-Partner: No    Emotionally Abused: No    Physically Abused: No    Sexually Abused: No    FAMILY HISTORY: Family History  Problem Relation Age of Onset   COPD Sister    Heart disease Sister 33       massive CVA,     Stroke Sister    Stroke Mother    COPD Mother    Heart disease Father    Hyperlipidemia Father    Breast cancer Paternal Aunt    Breast cancer Paternal Aunt    Breast cancer Paternal Aunt    Breast cancer Paternal Aunt    Colon cancer Neg Hx    Colon polyps Neg Hx    Esophageal cancer Neg Hx    Rectal cancer Neg Hx    Stomach cancer Neg Hx     ALLERGIES:  is allergic to penicillins and epinephrine.  MEDICATIONS:  Current Outpatient Medications  Medication Sig Dispense Refill   tamoxifen (NOLVADEX) 10 MG tablet Take 1 tablet (10 mg total) by mouth daily. 90 tablet 3   acetaminophen (TYLENOL) 325 MG tablet Take 650 mg by mouth every 6 (six) hours as needed for moderate pain or mild pain.     amitriptyline (ELAVIL) 10 MG tablet Take 3 tablets (30 mg total) by mouth at bedtime. 270 tablet 3   aspirin 81 MG EC tablet Take 1 tablet by mouth daily.     BIOTIN PO Take 5,000 mcg by mouth daily.      CALCIUM PO Take 1 tablet by mouth in the morning and at bedtime.     Glucosamine HCl-MSM (GLUCOSAMINE-MSM PO) Take 1 tablet by mouth daily.     Multiple Vitamin (MULTIVITAMIN) tablet Take 2 tablets by mouth daily.      Omega-3 Fatty Acids (OMEGA 3 PO) Take 600 mg by mouth  daily.      sertraline (ZOLOFT) 25 MG tablet TAKE 1 TABLET (25 MG TOTAL) BY MOUTH DAILY. 90 tablet 1   tretinoin (RETIN-A) 0.025 % cream Apply topically at bedtime.     No current facility-administered medications for this visit.    REVIEW OF SYSTEMS:   Constitutional: Denies fevers, chills or abnormal night sweats   All other systems were reviewed with the patient and are negative.  PHYSICAL EXAMINATION: ECOG PERFORMANCE STATUS: 0 - Asymptomatic  Vitals:   06/23/22 1505  BP: (!) 153/98  Pulse: 84  Resp: 16  Temp: 97.7 F (36.5 C)  SpO2: 99%   Filed Weights   06/23/22 1505  Weight: 158 lb 9.6 oz (71.9 kg)    GENERAL:alert, no distress and comfortable    LABORATORY DATA:  I have reviewed the data as listed Lab Results  Component Value Date   WBC 6.3 05/19/2019   HGB 15.4 (H) 05/19/2019   HCT 45.9 05/19/2019   MCV 94.4 05/19/2019   PLT 291.0 05/19/2019   Lab Results  Component Value Date   NA 138 04/17/2022   K 4.6 04/17/2022   CL 103 04/17/2022   CO2 28 04/17/2022    RADIOGRAPHIC STUDIES: I have personally reviewed the radiological reports and agreed with the findings in the report.  ASSESSMENT AND PLAN:  Ductal carcinoma in situ (DCIS) of right breast 05/26/2022: Right lumpectomy: 0.2 cm low-grade DCIS, intraductal papilloma, margins negative, ER 100%, PR 100% CHEK2 gene mutation  Pathology review: I discussed with the patient the difference between DCIS and invasive breast cancer. It is considered a precancerous lesion. DCIS is classified as a 0. It is generally detected through mammograms as calcifications. We discussed the significance of grades and its impact on prognosis. We also  discussed the importance of ER and PR receptors and their implications to adjuvant treatment options. Prognosis of DCIS dependence on grade, comedo necrosis. It is anticipated that if not treated, 20-30% of DCIS can develop into invasive breast cancer.  Recommendation: 1.adjuvant radiation therapy (after discussing pros and cons of radiation she decided to forego radiation) 2. Followed by antiestrogen therapy with tamoxifen 5 years  CHEK 2 mutation: Based on NCCN guidelines, this is a pathogenic mutation that has been associated with risk of not only breast cancer but also colon, thyroid and kidney cancers.   Tamoxifen counseling: We discussed the risks and benefits of tamoxifen. These include but not limited to insomnia, hot flashes, mood changes, vaginal dryness, and weight gain. Although rare, serious side effects including risk of blood clots were also discussed. We strongly believe that the benefits far outweigh the risks. Patient understands these risks and consented to starting treatment. Planned treatment duration is 5 years.  We will start her at 10 mg daily.  Briefly discussed the interaction between Zoloft and tamoxifen.  We felt that it would be fine to continue with the Zoloft.  Return to clinic after surgery to discuss the final pathology report and come up with an adjuvant treatment plan.   All questions were answered. The patient knows to call the clinic with any problems, questions or concerns.    Harriette Ohara, MD 06/23/22

## 2022-06-24 ENCOUNTER — Encounter: Payer: Self-pay | Admitting: *Deleted

## 2022-06-24 ENCOUNTER — Encounter: Payer: Self-pay | Admitting: Family Medicine

## 2022-06-24 ENCOUNTER — Ambulatory Visit (INDEPENDENT_AMBULATORY_CARE_PROVIDER_SITE_OTHER): Payer: Medicare Other | Admitting: Family Medicine

## 2022-06-24 VITALS — BP 118/80 | HR 85 | Temp 97.6°F | Ht 64.0 in | Wt 157.0 lb

## 2022-06-24 DIAGNOSIS — W19XXXA Unspecified fall, initial encounter: Secondary | ICD-10-CM | POA: Insufficient documentation

## 2022-06-24 DIAGNOSIS — D0511 Intraductal carcinoma in situ of right breast: Secondary | ICD-10-CM | POA: Diagnosis not present

## 2022-06-24 DIAGNOSIS — S0083XD Contusion of other part of head, subsequent encounter: Secondary | ICD-10-CM

## 2022-06-24 DIAGNOSIS — Z1589 Genetic susceptibility to other disease: Secondary | ICD-10-CM | POA: Diagnosis not present

## 2022-06-24 DIAGNOSIS — Z1502 Genetic susceptibility to malignant neoplasm of ovary: Secondary | ICD-10-CM | POA: Diagnosis not present

## 2022-06-24 DIAGNOSIS — W19XXXD Unspecified fall, subsequent encounter: Secondary | ICD-10-CM | POA: Diagnosis not present

## 2022-06-24 DIAGNOSIS — Z1501 Genetic susceptibility to malignant neoplasm of breast: Secondary | ICD-10-CM

## 2022-06-24 DIAGNOSIS — S0083XA Contusion of other part of head, initial encounter: Secondary | ICD-10-CM | POA: Insufficient documentation

## 2022-06-24 DIAGNOSIS — Z1509 Genetic susceptibility to other malignant neoplasm: Secondary | ICD-10-CM | POA: Diagnosis not present

## 2022-06-24 NOTE — Assessment & Plan Note (Addendum)
Followed by Dr. Lindi Adie.  Started on tamoxifen 10 mg p.o. daily x 5 years.

## 2022-06-24 NOTE — Progress Notes (Signed)
Patient ID: Rachael Sharp, female    DOB: 04/15/1951, 72 y.o.   MRN: [id number]  This visit was conducted in person.  BP 118/80 (BP Location: Left Arm, Patient Position: Sitting, Cuff Size: Normal)   Pulse 85   Temp 97.6 F (36.4 C) (Temporal)   Ht 5' 4"$  (1.626 m)   Wt 157 lb (71.2 kg)   SpO2 98%   BMI 26.95 kg/m    CC:  Chief Complaint  Patient presents with   Hospitalization Follow-up    Subjective:   HPI: Rachael Sharp is a 72 y.o. female presenting on 06/24/2022 for Hospitalization Follow-up  Recent diagnosis of right breast  DCIS, CHEK2 mutation.  Upcoming plans for  oral antiestrogen treatment x 5 years.  She has decided to forego radiation ( Dr. Isidore Moos). Reviewed most recent office visit from February 13 oncology, Dr. Lindi Adie. She was started yesterday on tamoxifen 10 mg p.o. daily  Reviewed ER encounter from February 11.  She presented to the emergency room after mechanical fall in the middle of the night when getting up to use the restroom.  In a dark room she tripped and hit tile in the shower with her right cheek, right knee, right upper arm. CT head, CT cervical spine and CT maxillofacial showed cheek hematoma and some degenerative changes in her cervical spine but no intracranial bleeding or skull fracture.   No proceeding symptoms to the falls.  First 2-3 days after has a mild headache.. now gone. No neuro symptoms Minimal pain.     CHEK 2 mutation: Based on NCCN guidelines, this is a pathogenic mutation that has been associated with risk of not only breast cancer but also colon, thyroid and kidney cancers.   Relevant past medical, surgical, family and social history reviewed and updated as indicated. Interim medical history since our last visit reviewed. Allergies and medications reviewed and updated. Outpatient Medications Prior to Visit  Medication Sig Dispense Refill   amitriptyline (ELAVIL) 10 MG tablet Take 3 tablets (30 mg total) by mouth  at bedtime. 270 tablet 3   aspirin 81 MG EC tablet Take 1 tablet by mouth daily.     BIOTIN PO Take 5,000 mcg by mouth daily.     CALCIUM PO Take 1 tablet by mouth in the morning and at bedtime.     Glucosamine HCl-MSM (GLUCOSAMINE-MSM PO) Take 1 tablet by mouth daily.     Multiple Vitamin (MULTIVITAMIN) tablet Take 2 tablets by mouth daily.      Omega-3 Fatty Acids (OMEGA 3 PO) Take 600 mg by mouth daily.      sertraline (ZOLOFT) 25 MG tablet TAKE 1 TABLET (25 MG TOTAL) BY MOUTH DAILY. 90 tablet 1   tamoxifen (NOLVADEX) 10 MG tablet Take 1 tablet (10 mg total) by mouth daily. 90 tablet 3   tretinoin (RETIN-A) 0.025 % cream Apply topically at bedtime.     acetaminophen (TYLENOL) 325 MG tablet Take 650 mg by mouth every 6 (six) hours as needed for moderate pain or mild pain.     No facility-administered medications prior to visit.     Per HPI unless specifically indicated in ROS section below Review of Systems  Constitutional:  Negative for fatigue and fever.  HENT:  Negative for congestion.   Eyes:  Negative for pain.  Respiratory:  Negative for cough and shortness of breath.   Cardiovascular:  Negative for chest pain, palpitations and leg swelling.  Gastrointestinal:  Negative for abdominal pain.  Genitourinary:  Negative for dysuria and vaginal bleeding.  Musculoskeletal:  Negative for back pain.  Neurological:  Negative for syncope, light-headedness and headaches.  Psychiatric/Behavioral:  Negative for dysphoric mood.    Objective:  BP 118/80 (BP Location: Left Arm, Patient Position: Sitting, Cuff Size: Normal)   Pulse 85   Temp 97.6 F (36.4 C) (Temporal)   Ht 5' 4"$  (1.626 m)   Wt 157 lb (71.2 kg)   SpO2 98%   BMI 26.95 kg/m   Wt Readings from Last 3 Encounters:  06/24/22 157 lb (71.2 kg)  06/23/22 158 lb 9.6 oz (71.9 kg)  06/21/22 158 lb 11.7 oz (72 kg)      Physical Exam Constitutional:      General: She is not in acute distress.    Appearance: Normal appearance.  She is well-developed. She is not ill-appearing or toxic-appearing.  HENT:     Head: Normocephalic.     Comments: Periorbital bruising right facial contusion and underlying hematoma, mildly tender to palpation    Right Ear: Hearing, tympanic membrane, ear canal and external ear normal. Tympanic membrane is not erythematous, retracted or bulging.     Left Ear: Hearing, tympanic membrane, ear canal and external ear normal. Tympanic membrane is not erythematous, retracted or bulging.     Nose: No mucosal edema or rhinorrhea.     Right Sinus: No maxillary sinus tenderness or frontal sinus tenderness.     Left Sinus: No maxillary sinus tenderness or frontal sinus tenderness.     Mouth/Throat:     Pharynx: Uvula midline.  Eyes:     General: Lids are normal. Lids are everted, no foreign bodies appreciated. Vision grossly intact.     Extraocular Movements: Extraocular movements intact.     Conjunctiva/sclera: Conjunctivae normal.     Pupils: Pupils are equal, round, and reactive to light.  Neck:     Thyroid: No thyroid mass or thyromegaly.     Vascular: No carotid bruit.     Trachea: Trachea normal.  Cardiovascular:     Rate and Rhythm: Normal rate and regular rhythm.     Pulses: Normal pulses.     Heart sounds: Normal heart sounds, S1 normal and S2 normal. No murmur heard.    No friction rub. No gallop.  Pulmonary:     Effort: Pulmonary effort is normal. No tachypnea or respiratory distress.     Breath sounds: Normal breath sounds. No decreased breath sounds, wheezing, rhonchi or rales.  Abdominal:     General: Bowel sounds are normal.     Palpations: Abdomen is soft.     Tenderness: There is no abdominal tenderness.  Musculoskeletal:     Cervical back: Normal range of motion and neck supple.  Skin:    General: Skin is warm and dry.     Findings: No rash.  Neurological:     Mental Status: She is alert and oriented to person, place, and time.     GCS: GCS eye subscore is 4. GCS verbal  subscore is 5. GCS motor subscore is 6.     Cranial Nerves: No cranial nerve deficit.     Sensory: No sensory deficit.     Motor: No abnormal muscle tone.     Coordination: Coordination normal.     Gait: Gait normal.     Deep Tendon Reflexes: Reflexes are normal and symmetric.     Comments: Nml cerebellar exam   No papilledema  Psychiatric:        Mood  and Affect: Mood is not anxious or depressed.        Speech: Speech normal.        Behavior: Behavior normal. Behavior is cooperative.        Thought Content: Thought content normal.        Cognition and Memory: Memory is not impaired. She does not exhibit impaired recent memory or impaired remote memory.        Judgment: Judgment normal.         Assessment and Plan  Accidental fall, subsequent encounter Assessment & Plan: Acute, no preceding symptoms.  Negative CT head, CT cervical spine in emergency room. No subacute suggestion.  She is improving as expected today.  No sign or symptom of continue concussion.   Monoallelic mutation of CHEK2 gene in female patient  Facial hematoma, subsequent encounter Assessment & Plan: Acute, improving   Ductal carcinoma in situ (DCIS) of right breast Assessment & Plan: Followed by Dr. Lindi Adie.  Started on tamoxifen 10 mg p.o. daily x 5 years.     No follow-ups on file.   Eliezer Lofts, MD

## 2022-06-24 NOTE — Assessment & Plan Note (Signed)
Acute, no preceding symptoms.  Negative CT head, CT cervical spine in emergency room. No subacute suggestion.  She is improving as expected today.  No sign or symptom of continue concussion.

## 2022-06-24 NOTE — Assessment & Plan Note (Signed)
Acute, improving

## 2022-07-16 ENCOUNTER — Other Ambulatory Visit: Payer: Self-pay | Admitting: *Deleted

## 2022-07-16 DIAGNOSIS — D0511 Intraductal carcinoma in situ of right breast: Secondary | ICD-10-CM

## 2022-07-16 NOTE — Progress Notes (Signed)
Received call from pt stating Dr. Barry Dienes would like to schedule breast MRI in March of 2024 and Mammogram in October.  Pt requesting advice from MD if okay to proceed with this plan. MD states he agrees with Dr. Marlowe Aschoff plan, pt educated and verbalized understanding.

## 2022-07-17 ENCOUNTER — Other Ambulatory Visit: Payer: Self-pay | Admitting: General Surgery

## 2022-07-17 DIAGNOSIS — C50219 Malignant neoplasm of upper-inner quadrant of unspecified female breast: Secondary | ICD-10-CM

## 2022-07-24 ENCOUNTER — Encounter: Payer: Self-pay | Admitting: Family Medicine

## 2022-08-11 ENCOUNTER — Ambulatory Visit
Admission: RE | Admit: 2022-08-11 | Discharge: 2022-08-11 | Disposition: A | Discharge status: Home / Self Care | Payer: Medicare Other | Source: Ambulatory Visit | Attending: General Surgery | Admitting: General Surgery

## 2022-08-11 DIAGNOSIS — C50212 Malignant neoplasm of upper-inner quadrant of left female breast: Secondary | ICD-10-CM | POA: Diagnosis not present

## 2022-08-11 DIAGNOSIS — C50219 Malignant neoplasm of upper-inner quadrant of unspecified female breast: Secondary | ICD-10-CM

## 2022-08-11 MED ORDER — GADOPICLENOL 0.5 MMOL/ML IV SOLN
6.0000 mL | Freq: Once | INTRAVENOUS | Status: AC | PRN
  Administered 2022-08-11: 6 mL via INTRAVENOUS

## 2022-09-22 ENCOUNTER — Inpatient Hospital Stay: Payer: Medicare Other | Attending: Hematology and Oncology | Admitting: Adult Health

## 2022-09-22 VITALS — BP 144/85 | HR 76 | Temp 97.2°F | Resp 15 | Wt 151.8 lb

## 2022-09-22 DIAGNOSIS — Z7982 Long term (current) use of aspirin: Secondary | ICD-10-CM | POA: Insufficient documentation

## 2022-09-22 DIAGNOSIS — Z170 Estrogen receptor positive status [ER+]: Secondary | ICD-10-CM | POA: Insufficient documentation

## 2022-09-22 DIAGNOSIS — R232 Flushing: Secondary | ICD-10-CM | POA: Diagnosis not present

## 2022-09-22 DIAGNOSIS — Z803 Family history of malignant neoplasm of breast: Secondary | ICD-10-CM | POA: Insufficient documentation

## 2022-09-22 DIAGNOSIS — D0511 Intraductal carcinoma in situ of right breast: Secondary | ICD-10-CM | POA: Diagnosis not present

## 2022-09-22 DIAGNOSIS — Z79899 Other long term (current) drug therapy: Secondary | ICD-10-CM | POA: Diagnosis not present

## 2022-09-22 DIAGNOSIS — Z79810 Long term (current) use of selective estrogen receptor modulators (SERMs): Secondary | ICD-10-CM | POA: Insufficient documentation

## 2022-09-22 DIAGNOSIS — Z87891 Personal history of nicotine dependence: Secondary | ICD-10-CM | POA: Diagnosis not present

## 2022-09-22 NOTE — Progress Notes (Signed)
SURVIVORSHIP VISIT:  BRIEF ONCOLOGIC HISTORY:  Oncology History  Ductal carcinoma in situ (DCIS) of right breast  07/18/2015 Genetic Testing   + CHEK 2 mutation.  The STAT Breast cancer panel offered by [facility] includes sequencing and rearrangement analysis for the following 9 genes:  ATM, BRCA1, BRCA2, CDH1, CHEK2, PALB2, PTEN, STK11 and TP53.      05/26/2022 Initial Diagnosis   Right lumpectomy: 0.2 cm low-grade DCIS, intraductal papilloma, margins negative, ER 100%, PR 100%   06/2022 -  Anti-estrogen oral therapy   Tamoxifen x 5 years   09/22/2022 Cancer Staging   Staging form: Breast, AJCC 8th Edition - Pathologic: Stage 0 (pTis (DCIS), pN0, cM0, ER+, PR+) - Signed by Loa Socks, NP on 09/22/2022     INTERVAL HISTORY:  Ms. Bruno to review her survivorship care plan detailing her treatment course for breast cancer, as well as monitoring long-term side effects of that treatment, education regarding health maintenance, screening, and overall wellness and health promotion.     Overall, Ms. Leake reports feeling quite well.  She is taking Tamoxifen at 10mg  per day and she is tolerating it moderately well.  She notes some hot flashes but otherwise tolerates it well.  She also has a CHEK 2 mutation.    REVIEW OF SYSTEMS:  Review of Systems  Constitutional:  Negative for appetite change, chills, fatigue, fever and unexpected weight change.  HENT:   Negative for hearing loss, lump/mass and trouble swallowing.   Eyes:  Negative for eye problems and icterus.  Respiratory:  Negative for chest tightness, cough and shortness of breath.   Cardiovascular:  Negative for chest pain, leg swelling and palpitations.  Gastrointestinal:  Negative for abdominal distention, abdominal pain, constipation, diarrhea, nausea and vomiting.  Endocrine: Positive for hot flashes.  Genitourinary:  Negative for difficulty urinating.   Musculoskeletal:  Negative for arthralgias.  Skin:   Negative for itching and rash.  Neurological:  Negative for dizziness, extremity weakness, headaches and numbness.  Hematological:  Negative for adenopathy. Does not bruise/bleed easily.  Psychiatric/Behavioral:  Negative for depression. The patient is not nervous/anxious.   Breast: Denies any new nodularity, masses, tenderness, nipple changes, or nipple discharge.       PAST MEDICAL/SURGICAL HISTORY:  Past Medical History:  Diagnosis Date   Anxiety    Arthritis    finger left index   Breast cancer (HCC) 05/26/2022   GERD (gastroesophageal reflux disease)    Heart murmur    mitral valve prolapse   Hyperlipidemia    denies 10/21/18,not on medication   Hyperplastic colon polyp    Monoallelic mutation of CHEK2 gene in female patient    Vitamin D deficiency    Past Surgical History:  Procedure Laterality Date   ABDOMINAL HYSTERECTOMY  12/2000   BREAST BIOPSY     biopsy x 2 , 1976 and 2002   BREAST BIOPSY  05/21/2022   MM RT RADIOACTIVE SEED LOC MAMMO GUIDE 05/21/2022 [facility]   BREAST CYST ASPIRATION     COLONOSCOPY  11/03/2018   Dr.Pyrtle   COLONOSCOPY  2017   LIPOMA EXCISION  1983   RADIOACTIVE SEED GUIDED EXCISIONAL BREAST BIOPSY Right 05/26/2022   Procedure: RADIOACTIVE SEED GUIDED EXCISIONAL RIGHT BREAST BIOPSY;  Surgeon: Almond Lint, MD;  Location: [facility];  Service: General;  Laterality: Right;   TONSILLECTOMY  1964   TUBAL LIGATION  1982     ALLERGIES:  Allergies  Allergen Reactions   Penicillins  REACTION: rash   Epinephrine Palpitations     CURRENT MEDICATIONS:  Outpatient Encounter Medications as of 09/22/2022  Medication Sig   amitriptyline (ELAVIL) 10 MG tablet Take 3 tablets (30 mg total) by mouth at bedtime.   aspirin 81 MG EC tablet Take 1 tablet by mouth daily.   BIOTIN PO Take 5,000 mcg by mouth daily.   CALCIUM PO Take 1 tablet by mouth in the morning and at bedtime.   Glucosamine HCl-MSM (GLUCOSAMINE-MSM PO) Take  1 tablet by mouth daily.   Multiple Vitamin (MULTIVITAMIN) tablet Take 2 tablets by mouth daily.    Omega-3 Fatty Acids (OMEGA 3 PO) Take 600 mg by mouth daily.    sertraline (ZOLOFT) 25 MG tablet TAKE 1 TABLET (25 MG TOTAL) BY MOUTH DAILY.   tamoxifen (NOLVADEX) 10 MG tablet Take 1 tablet (10 mg total) by mouth daily.   tretinoin (RETIN-A) 0.025 % cream Apply topically at bedtime.   No facility-administered encounter medications on file as of 09/22/2022.     ONCOLOGIC FAMILY HISTORY:  Family History  Problem Relation Age of Onset   COPD Sister    Heart disease Sister 23       massive CVA,     Stroke Sister    Stroke Mother    COPD Mother    Heart disease Father    Hyperlipidemia Father    Breast cancer Paternal Aunt    Breast cancer Paternal Aunt    Breast cancer Paternal Aunt    Breast cancer Paternal Aunt    Colon cancer Neg Hx    Colon polyps Neg Hx    Esophageal cancer Neg Hx    Rectal cancer Neg Hx    Stomach cancer Neg Hx      SOCIAL HISTORY:  Social History   Socioeconomic History   Marital status: Married    Spouse name: Not on file   Number of children: Not on file   Years of education: Not on file   Highest education level: Not on file  Occupational History   Occupation: retired  Tobacco Use   Smoking status: Former    Packs/day: 0.25    Years: 20.00    Additional pack years: 0.00    Total pack years: 5.00    Types: Cigarettes    Quit date: 06/02/1989    Years since quitting: 33.3   Smokeless tobacco: Never   Tobacco comments:    Quit 1991  Vaping Use   Vaping Use: Never used  Substance and Sexual Activity   Alcohol use: Yes    Alcohol/week: 7.0 standard drinks of alcohol    Types: 7 Standard drinks or equivalent per week    Comment: nightly wine   Drug use: No   Sexual activity: Not Currently    Partners: Male    Comment: 1st intercourse-19, partners- 4, married- 40 yr s  Other Topics Concern   Not on file  Social History Narrative    Not on file   Social Determinants of Health   Financial Resource Strain: Low Risk  (03/04/2022)   Overall Financial Resource Strain (CARDIA)    Difficulty of Paying Living Expenses: Not hard at all  Food Insecurity: No Food Insecurity (06/04/2022)   Hunger Vital Sign    Worried About Running Out of Food in the Last Year: Never true    Ran Out of Food in the Last Year: Never true  Transportation Needs: No Transportation Needs (06/04/2022)   PRAPARE - Transportation  Lack of Transportation (Medical): No    Lack of Transportation (Non-Medical): No  Physical Activity: Sufficiently Active (03/04/2022)   Exercise Vital Sign    Days of Exercise per Week: 5 days    Minutes of Exercise per Session: 90 min  Stress: No Stress Concern Present (03/04/2022)   [employer] of Occupational Health - Occupational Stress Questionnaire    Feeling of Stress : Only a little  Social Connections: Moderately Isolated (03/04/2022)   Social Connection and Isolation Panel [NHANES]    Frequency of Communication with Friends and Family: More than three times a week    Frequency of Social Gatherings with Friends and Family: Once a week    Attends Religious Services: Never    Database administrator or Organizations: No    Attends Banker Meetings: Never    Marital Status: Married  Catering manager Violence: Not At Risk (06/04/2022)   Humiliation, Afraid, Rape, and Kick questionnaire    Fear of Current or Ex-Partner: No    Emotionally Abused: No    Physically Abused: No    Sexually Abused: No     OBSERVATIONS/OBJECTIVE:  BP (!) 144/85 (BP Location: Left Arm, Patient Position: Sitting)   Pulse 76   Temp (!) 97.2 F (36.2 C) (Temporal)   Resp 15   Wt 151 lb 12.8 oz (68.9 kg)   SpO2 100%   BMI 26.06 kg/m  GENERAL: Patient is a well appearing female in no acute distress HEENT:  Sclerae anicteric.  Oropharynx clear and moist. No ulcerations or evidence of oropharyngeal candidiasis. Neck  is supple.  NODES:  No cervical, supraclavicular, or axillary lymphadenopathy palpated.  BREAST EXAM: right breast s/p lumpectomy, no sign of local recurrence, left breast benign.   LUNGS:  Clear to auscultation bilaterally.  No wheezes or rhonchi. HEART:  Regular rate and rhythm. No murmur appreciated. ABDOMEN:  Soft, nontender.  Positive, normoactive bowel sounds. No organomegaly palpated. MSK:  No focal spinal tenderness to palpation. Full range of motion bilaterally in the upper extremities. EXTREMITIES:  No peripheral edema.   SKIN:  Clear with no obvious rashes or skin changes. No nail dyscrasia. NEURO:  Nonfocal. Well oriented.  Appropriate affect.   LABORATORY DATA:  None for this visit.  DIAGNOSTIC IMAGING:  None for this visit.      ASSESSMENT AND PLAN:  Ms.. Stampone is a pleasant 72 y.o. female with Stage 0 right breast DCIS, ER+/PR+, diagnosed in 05/2022, treated with lumpectomy and anti-estrogen therapy with Tamoxifen beginning in 06/2022.  She presents to the Survivorship Clinic for our initial meeting and routine follow-up post-completion of treatment for breast cancer.    1. Stage 0 right breast cancer:  Ms. Feeley is continuing to recover from definitive treatment for breast cancer. She will follow-up with her medical oncologist, Dr. Pamelia Hoit in 3 months with history and physical exam per surveillance protocol.  She will continue her anti-estrogen therapy with Tamoxifen. Thus far, she is tolerating the Tamoxifen well, with minimal side effects. Her mammogram is due 02/2023; orders placed today.   She is recommended supplemental breast MRI every April due to the fact that her breast cancer was mammographically occult and her CHEK 2 mutation.    Today, a comprehensive survivorship care plan and treatment summary was reviewed with the patient today detailing her breast cancer diagnosis, treatment course, potential late/long-term effects of treatment, appropriate follow-up  care with recommendations for the future, and patient education resources.  A copy of this summary, along  with a letter will be sent to the patient's primary care provider via mail/fax/In Basket message after today's visit.    2. Bone health:  She was given education on specific activities to promote bone health.  3. Cancer screening:  Due to Ms. Mota's history and her age, she should receive screening for skin cancers, colon cancer.  The information and recommendations are listed on the patient's comprehensive care plan/treatment summary and were reviewed in detail with the patient.    4. Health maintenance and wellness promotion: Ms. Oborn was encouraged to consume 5-7 servings of fruits and vegetables per day. We reviewed the "Nutrition Rainbow" handout.  She was also encouraged to engage in moderate to vigorous exercise for 30 minutes per day most days of the week.  She was instructed to limit her alcohol consumption and continue to abstain from tobacco use.     5. Support services/counseling: It is not uncommon for this period of the patient's cancer care trajectory to be one of many emotions and stressors.   She was given information regarding our available services and encouraged to contact me with any questions or for help enrolling in any of our support group/programs.    Follow up instructions:    -Return to cancer center in 3 months for f/u with Dr. Pamelia Hoit  -Mammogram due in 02/2023 -Breast MRI due in 08/2023 -She is welcome to return back to the Survivorship Clinic at any time; no additional follow-up needed at this time.  -Consider referral back to survivorship as a long-term survivor for continued surveillance  The patient was provided an opportunity to ask questions and all were answered. The patient agreed with the plan and demonstrated an understanding of the instructions.   Total encounter time:50 minutes*in face-to-face visit time, chart review, lab review, care  coordination, order entry, and documentation of the encounter time.   Lillard Anes, NP 09/22/22 11:38 AM Medical Oncology and Hematology [facility] [address] [place], [place] [address] Tel. [phone]    Fax. [phone]  *Total Encounter Time as defined by the Centers for Medicare and Medicaid Services includes, in addition to the face-to-face time of a patient visit (documented in the note above) non-face-to-face time: obtaining and reviewing outside history, ordering and reviewing medications, tests or procedures, care coordination (communications with other health care professionals or caregivers) and documentation in the medical record.

## 2022-09-23 ENCOUNTER — Other Ambulatory Visit: Payer: Self-pay | Admitting: Adult Health

## 2022-09-23 DIAGNOSIS — D0511 Intraductal carcinoma in situ of right breast: Secondary | ICD-10-CM

## 2022-09-27 ENCOUNTER — Encounter: Payer: Self-pay | Admitting: Adult Health

## 2022-10-01 ENCOUNTER — Ambulatory Visit (INDEPENDENT_AMBULATORY_CARE_PROVIDER_SITE_OTHER): Payer: Medicare Other | Admitting: Family Medicine

## 2022-10-01 ENCOUNTER — Encounter: Payer: Self-pay | Admitting: Family Medicine

## 2022-10-01 VITALS — BP 112/80 | HR 74 | Temp 97.6°F | Ht 64.0 in | Wt 148.0 lb

## 2022-10-01 DIAGNOSIS — S0083XD Contusion of other part of head, subsequent encounter: Secondary | ICD-10-CM | POA: Diagnosis not present

## 2022-10-01 NOTE — Progress Notes (Signed)
Patient ID: Rachael Sharp, female    DOB: 1950/09/04, 72 y.o.   MRN: [id number]  This visit was conducted in person.  BP 112/80 (BP Location: Left Arm, Patient Position: Sitting, Cuff Size: Normal)   Pulse 74   Temp 97.6 F (36.4 C) (Temporal)   Ht 5\' 4"  (1.626 m)   Wt 148 lb (67.1 kg)   SpO2 98%   BMI 25.40 kg/m    CC:  Chief Complaint  Patient presents with   Hematoma    Still has knot on face from fall in February-Right cheek    Subjective:   HPI: Rachael Sharp is a 72 y.o. female presenting on 10/01/2022 for Hematoma (Still has knot on face from fall in February-Right cheek)  Reviewed office visit note from June 21, 2022 following accidental fall and facial hematoma   She presented to the emergency room after mechanical fall in the middle of the night when getting up to use the restroom.  In a dark room she tripped and hit tile in the shower with her right cheek, right knee, right upper arm. CT head, CT cervical spine and CT maxillofacial showed cheek hematoma and some degenerative changes in her cervical spine but no intracranial bleeding or skull fracture.    No proceeding symptoms to the falls.    Today she reports continued hematoma over right cheek. Has shrunk over half  in last 3 months. Area remains sore to touch. Occ tingling at the area.  No heat, has been slightly red since injury.  No airway compromise. No issue breathing through your nose.      Relevant past medical, surgical, family and social history reviewed and updated as indicated. Interim medical history since our last visit reviewed. Allergies and medications reviewed and updated. Outpatient Medications Prior to Visit  Medication Sig Dispense Refill   amitriptyline (ELAVIL) 10 MG tablet Take 3 tablets (30 mg total) by mouth at bedtime. 270 tablet 3   aspirin 81 MG EC tablet Take 1 tablet by mouth daily.     BIOTIN PO Take 5,000 mcg by mouth daily.     CALCIUM PO Take 1 tablet  by mouth in the morning and at bedtime.     Glucosamine HCl-MSM (GLUCOSAMINE-MSM PO) Take 1 tablet by mouth daily.     Multiple Vitamin (MULTIVITAMIN) tablet Take 2 tablets by mouth daily.      Omega-3 Fatty Acids (OMEGA 3 PO) Take 600 mg by mouth daily.      sertraline (ZOLOFT) 25 MG tablet TAKE 1 TABLET (25 MG TOTAL) BY MOUTH DAILY. 90 tablet 1   tamoxifen (NOLVADEX) 10 MG tablet Take 1 tablet (10 mg total) by mouth daily. 90 tablet 3   tretinoin (RETIN-A) 0.025 % cream Apply topically at bedtime.     No facility-administered medications prior to visit.     Per HPI unless specifically indicated in ROS section below Review of Systems  Constitutional:  Negative for fatigue and fever.  HENT:  Negative for congestion.   Eyes:  Negative for pain.  Respiratory:  Negative for cough and shortness of breath.   Cardiovascular:  Negative for chest pain, palpitations and leg swelling.  Gastrointestinal:  Negative for abdominal pain.  Genitourinary:  Negative for dysuria and vaginal bleeding.  Musculoskeletal:  Negative for back pain.  Neurological:  Negative for syncope, light-headedness and headaches.  Psychiatric/Behavioral:  Negative for dysphoric mood.    Objective:  BP 112/80 (BP Location: Left Arm, Patient Position: Sitting,  Cuff Size: Normal)   Pulse 74   Temp 97.6 F (36.4 C) (Temporal)   Ht 5\' 4"  (1.626 m)   Wt 148 lb (67.1 kg)   SpO2 98%   BMI 25.40 kg/m   Wt Readings from Last 3 Encounters:  10/01/22 148 lb (67.1 kg)  09/22/22 151 lb 12.8 oz (68.9 kg)  06/24/22 157 lb (71.2 kg)      Physical Exam Constitutional:      General: She is not in acute distress.    Appearance: Normal appearance. She is well-developed. She is not ill-appearing or toxic-appearing.  HENT:     Head: Normocephalic.     Right Ear: Hearing, tympanic membrane, ear canal and external ear normal. Tympanic membrane is not erythematous, retracted or bulging.     Left Ear: Hearing, tympanic membrane, ear  canal and external ear normal. Tympanic membrane is not erythematous, retracted or bulging.     Nose: No mucosal edema or rhinorrhea.     Right Sinus: No maxillary sinus tenderness or frontal sinus tenderness.     Left Sinus: No maxillary sinus tenderness or frontal sinus tenderness.     Mouth/Throat:     Pharynx: Uvula midline.  Eyes:     General: Lids are normal. Lids are everted, no foreign bodies appreciated.     Conjunctiva/sclera: Conjunctivae normal.     Pupils: Pupils are equal, round, and reactive to light.  Neck:     Thyroid: No thyroid mass or thyromegaly.     Vascular: No carotid bruit.     Trachea: Trachea normal.  Cardiovascular:     Rate and Rhythm: Normal rate and regular rhythm.     Pulses: Normal pulses.     Heart sounds: Normal heart sounds, S1 normal and S2 normal. No murmur heard.    No friction rub. No gallop.  Pulmonary:     Effort: Pulmonary effort is normal. No tachypnea or respiratory distress.     Breath sounds: Normal breath sounds. No decreased breath sounds, wheezing, rhonchi or rales.  Abdominal:     General: Bowel sounds are normal.     Palpations: Abdomen is soft.     Tenderness: There is no abdominal tenderness.  Musculoskeletal:     Cervical back: Normal range of motion and neck supple.  Skin:    General: Skin is warm and dry.     Findings: No rash.     Comments: Ttp over hematoma on right cheek  Neurological:     Mental Status: She is alert.  Psychiatric:        Mood and Affect: Mood is not anxious or depressed.        Speech: Speech normal.        Behavior: Behavior normal. Behavior is cooperative.        Thought Content: Thought content normal.        Judgment: Judgment normal.         Assessment and Plan  Facial hematoma, subsequent encounter Assessment & Plan:   Now ongoing x 3 months, has decreased in size some but with associated tingling and pain that bothers patient. She cannot sleep on right side of face. No evidence of  airway compromise. No sign and symptom of infection.  Will refer to plastic surgery for consultation upon further expected recovery time versus need for surgical removal.  Orders: -     Ambulatory referral to Plastic Surgery    No follow-ups on file.   Kerby Nora, MD

## 2022-10-01 NOTE — Assessment & Plan Note (Signed)
Now ongoing x 3 months, has decreased in size some but with associated tingling and pain that bothers patient. She cannot sleep on right side of face. No evidence of airway compromise. No sign and symptom of infection.  Will refer to plastic surgery for consultation upon further expected recovery time versus need for surgical removal.

## 2022-10-04 ENCOUNTER — Other Ambulatory Visit: Payer: Self-pay | Admitting: Family Medicine

## 2022-10-06 NOTE — Telephone Encounter (Signed)
Last office visit 10/01/2022 for facial hematoma.  Last refilled 10/07/2021 for #270 with 3 refills.  Next Appt:  CPE 04/29/2023.

## 2022-10-07 ENCOUNTER — Telehealth: Payer: Self-pay | Admitting: Hematology and Oncology

## 2022-10-07 ENCOUNTER — Encounter: Payer: Self-pay | Admitting: Hematology and Oncology

## 2022-10-07 NOTE — Telephone Encounter (Signed)
Rescheduled appointment per secure chat. Patient is aware of the changes made to her upcoming appointment. 

## 2022-10-31 DIAGNOSIS — S93401A Sprain of unspecified ligament of right ankle, initial encounter: Secondary | ICD-10-CM | POA: Diagnosis not present

## 2022-11-01 ENCOUNTER — Ambulatory Visit: Payer: Medicare Other

## 2022-11-08 ENCOUNTER — Other Ambulatory Visit: Payer: Self-pay | Admitting: Family Medicine

## 2022-11-11 ENCOUNTER — Ambulatory Visit (INDEPENDENT_AMBULATORY_CARE_PROVIDER_SITE_OTHER): Payer: Medicare Other | Admitting: Family Medicine

## 2022-11-11 ENCOUNTER — Encounter: Payer: Self-pay | Admitting: Family Medicine

## 2022-11-11 VITALS — BP 122/80 | HR 80 | Temp 97.7°F | Ht 64.0 in | Wt 147.0 lb

## 2022-11-11 DIAGNOSIS — Z1509 Genetic susceptibility to other malignant neoplasm: Secondary | ICD-10-CM | POA: Diagnosis not present

## 2022-11-11 DIAGNOSIS — Z1589 Genetic susceptibility to other disease: Secondary | ICD-10-CM | POA: Diagnosis not present

## 2022-11-11 DIAGNOSIS — Z1501 Genetic susceptibility to malignant neoplasm of breast: Secondary | ICD-10-CM | POA: Diagnosis not present

## 2022-11-11 DIAGNOSIS — D0511 Intraductal carcinoma in situ of right breast: Secondary | ICD-10-CM

## 2022-11-11 DIAGNOSIS — Z1502 Genetic susceptibility to malignant neoplasm of ovary: Secondary | ICD-10-CM | POA: Diagnosis not present

## 2022-11-11 NOTE — Assessment & Plan Note (Signed)
Active treatment, followed by Dr. Pamelia Hoit oncology.  On tamoxifen 10 mg daily for the next 5 years. Discussed healthy lifestyle to mitigate side effects of this medication.

## 2022-11-11 NOTE — Assessment & Plan Note (Signed)
This is a pathogenic mutation that has been associated with breast cancer, colon, thyroid and kidney cancers.  Patient is followed by MRI, mammogram and oncology, Dr. Pamelia Hoit.  Breast exam performed today with no abnormalities.

## 2022-11-11 NOTE — Progress Notes (Signed)
Patient ID: Rachael Sharp, female    DOB: 1950/11/16, 72 y.o.   MRN: [id number]  This visit was conducted in person.  BP 122/80 (BP Location: Left Arm, Patient Position: Sitting, Cuff Size: Normal)   Pulse 80   Temp 97.7 F (36.5 C) (Temporal)   Ht 5\' 4"  (1.626 m)   Wt 147 lb (66.7 kg)   SpO2 96%   BMI 25.23 kg/m    CC: Chief Complaint  Patient presents with   breast exam    Patient would like exam done today    Subjective:   HPI: Rachael Sharp is a 72 y.o. female presenting on 11/11/2022 for breast exam (Patient would like exam done today)  She presents for 6 month breast exam given gene mutation.   Noted inverted nipple right last year... 07/2020 mammo was normal, cysts had shrunk. 03/2022 Diagnostic mammogram reviewed: normal. 08/11/22 MRI breast reviewed.  Reviewed oncology note from Sep 22, 2022, plans next OV 12/2022 with Dr. Pamelia Hoit.  Dr. Donell Beers surgeon. 05/26/2022 Initial Diagnosis    Right lumpectomy: 0.2 cm low-grade DCIS, intraductal papilloma, margins negative, ER 100%, PR 100%    06/2022 -  Anti-estrogen oral therapy    Tamoxifen x 5 years    09/22/2022 Cancer Staging    Staging form: Breast, AJCC 8th Edition - Pathologic: Stage 0 (pTis (DCIS), pN0, cM0, ER+, PR+) - Signed by Loa Socks, NP on 09/22/2022   Having hot flashes from  tamoxifem 10 mg daily.  Does not want to stop the sertraline, so Dr. Reece Agar placed her on a higher dose.     Relevant past medical, surgical, family and social history reviewed and updated as indicated. Interim medical history since our last visit reviewed. Allergies and medications reviewed and updated. Outpatient Medications Prior to Visit  Medication Sig Dispense Refill   amitriptyline (ELAVIL) 10 MG tablet TAKE 3 TABLETS BY MOUTH AT BEDTIME 270 tablet 3   aspirin 81 MG EC tablet Take 1 tablet by mouth daily.     BIOTIN PO Take 5,000 mcg by mouth daily.     CALCIUM PO Take 1 tablet by mouth in the morning  and at bedtime.     Glucosamine HCl-MSM (GLUCOSAMINE-MSM PO) Take 1 tablet by mouth daily.     Multiple Vitamin (MULTIVITAMIN) tablet Take 2 tablets by mouth daily.      Omega-3 Fatty Acids (OMEGA 3 PO) Take 600 mg by mouth daily.      sertraline (ZOLOFT) 25 MG tablet TAKE 1 TABLET (25 MG TOTAL) BY MOUTH DAILY. 90 tablet 1   tamoxifen (NOLVADEX) 10 MG tablet Take 1 tablet (10 mg total) by mouth daily. 90 tablet 3   tretinoin (RETIN-A) 0.025 % cream Apply topically at bedtime.     No facility-administered medications prior to visit.     Per HPI unless specifically indicated in ROS section below Review of Systems  Constitutional:  Negative for fatigue and fever.  HENT:  Negative for congestion.   Eyes:  Negative for pain.  Respiratory:  Negative for cough and shortness of breath.   Cardiovascular:  Negative for chest pain, palpitations and leg swelling.  Gastrointestinal:  Negative for abdominal pain.  Genitourinary:  Negative for dysuria and vaginal bleeding.  Musculoskeletal:  Negative for back pain.  Neurological:  Negative for syncope, light-headedness and headaches.  Psychiatric/Behavioral:  Negative for dysphoric mood.    Objective:  BP 122/80 (BP Location: Left Arm, Patient Position: Sitting, Cuff Size: Normal)  Pulse 80   Temp 97.7 F (36.5 C) (Temporal)   Ht 5\' 4"  (1.626 m)   Wt 147 lb (66.7 kg)   SpO2 96%   BMI 25.23 kg/m   Wt Readings from Last 3 Encounters:  11/11/22 147 lb (66.7 kg)  10/01/22 148 lb (67.1 kg)  09/22/22 151 lb 12.8 oz (68.9 kg)      Physical Exam Exam conducted with a chaperone present.  Constitutional:      Appearance: Normal appearance.  HENT:     Head: Normocephalic.     Nose: Nose normal.  Cardiovascular:     Rate and Rhythm: Normal rate.     Pulses: Normal pulses.  Pulmonary:     Effort: Pulmonary effort is normal.  Chest:  Breasts:    Breasts are symmetrical.     Right: Normal. No swelling, inverted nipple, mass, nipple  discharge, skin change or tenderness.     Left: Normal. No swelling, inverted nipple, mass, nipple discharge, skin change or tenderness.     Comments: Surgical scar on right at nipple, well healed Musculoskeletal:     Cervical back: Normal range of motion.  Lymphadenopathy:     Upper Body:     Right upper body: No supraclavicular, axillary or pectoral adenopathy.     Left upper body: No supraclavicular, axillary or pectoral adenopathy.  Neurological:     Mental Status: She is alert.           COVID 19 screen:  No recent travel or known exposure to COVID19 The patient denies respiratory symptoms of COVID 19 at this time. The importance of social distancing was discussed today.   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 Pamelia Hoit oncology.  On tamoxifen 10 mg daily for the next 5 years. Discussed healthy lifestyle to mitigate side effects of this medication.      Monoallelic mutation of CHEK2 gene in female patient    This is a pathogenic mutation that has been associated with breast cancer, colon, thyroid and kidney cancers.  Patient is followed by MRI, mammogram and oncology, Dr. Pamelia Hoit.  Breast exam performed today with no abnormalities.         Kerby Nora, MD

## 2022-11-27 DIAGNOSIS — H35372 Puckering of macula, left eye: Secondary | ICD-10-CM | POA: Diagnosis not present

## 2022-11-27 DIAGNOSIS — H2513 Age-related nuclear cataract, bilateral: Secondary | ICD-10-CM | POA: Diagnosis not present

## 2022-11-27 DIAGNOSIS — H43813 Vitreous degeneration, bilateral: Secondary | ICD-10-CM | POA: Diagnosis not present

## 2022-12-10 DIAGNOSIS — D2272 Melanocytic nevi of left lower limb, including hip: Secondary | ICD-10-CM | POA: Diagnosis not present

## 2022-12-10 DIAGNOSIS — D2261 Melanocytic nevi of right upper limb, including shoulder: Secondary | ICD-10-CM | POA: Diagnosis not present

## 2022-12-10 DIAGNOSIS — D2262 Melanocytic nevi of left upper limb, including shoulder: Secondary | ICD-10-CM | POA: Diagnosis not present

## 2022-12-10 DIAGNOSIS — D2271 Melanocytic nevi of right lower limb, including hip: Secondary | ICD-10-CM | POA: Diagnosis not present

## 2022-12-10 DIAGNOSIS — S0083XA Contusion of other part of head, initial encounter: Secondary | ICD-10-CM | POA: Diagnosis not present

## 2022-12-10 DIAGNOSIS — D225 Melanocytic nevi of trunk: Secondary | ICD-10-CM | POA: Diagnosis not present

## 2022-12-22 ENCOUNTER — Other Ambulatory Visit: Payer: Self-pay

## 2022-12-22 ENCOUNTER — Inpatient Hospital Stay: Payer: Medicare Other | Attending: Hematology and Oncology | Admitting: Hematology and Oncology

## 2022-12-22 VITALS — BP 132/74 | HR 70 | Temp 97.9°F | Resp 16 | Wt 150.6 lb

## 2022-12-22 DIAGNOSIS — D0511 Intraductal carcinoma in situ of right breast: Secondary | ICD-10-CM | POA: Insufficient documentation

## 2022-12-22 DIAGNOSIS — Z79810 Long term (current) use of selective estrogen receptor modulators (SERMs): Secondary | ICD-10-CM | POA: Diagnosis not present

## 2022-12-22 NOTE — Assessment & Plan Note (Addendum)
05/26/2022: Right lumpectomy: 0.2 cm low-grade DCIS, intraductal papilloma, margins negative, ER 100%, PR 100% CHEK2 gene mutation  Treatment plan: 1.adjuvant radiation therapy (after discussing pros and cons of radiation she decided to forego radiation) 2. Followed by antiestrogen therapy with tamoxifen 5 years 3.  CHEK2 mutation  Tamoxifen toxicities: Hot flashes: Moderate to severe.  Instructed her to change the tamoxifen from evening to daytime. Leg cramps: Encouraged her to take magnesium gluconate Difficulty staying asleep: We will see if the change in tamoxifen timing makes a difference.  Breast cancer surveillance: Breast exam 12/22/2022: Benign Mammogram scheduled for 02/11/2023 Breast MRI will be ordered for April 2025.  Return to clinic in 1 year for follow-up

## 2022-12-22 NOTE — Progress Notes (Signed)
Patient Care Team: Excell Seltzer, MD as PCP - General (Family Medicine) Serena Croissant, MD as Consulting Physician (Hematology and Oncology) Almond Lint, MD as Consulting Physician (General Surgery)  DIAGNOSIS:  Encounter Diagnosis  Name Primary?   Ductal carcinoma in situ (DCIS) of right breast Yes    SUMMARY OF ONCOLOGIC HISTORY: Oncology History  Ductal carcinoma in situ (DCIS) of right breast  07/18/2015 Genetic Testing   + CHEK 2 mutation.  The STAT Breast cancer panel offered by [facility] includes sequencing and rearrangement analysis for the following 9 genes:  ATM, BRCA1, BRCA2, CDH1, CHEK2, PALB2, PTEN, STK11 and TP53.      05/26/2022 Initial Diagnosis   Right lumpectomy: 0.2 cm low-grade DCIS, intraductal papilloma, margins negative, ER 100%, PR 100%   06/2022 -  Anti-estrogen oral therapy   Tamoxifen x 5 years   09/22/2022 Cancer Staging   Staging form: Breast, AJCC 8th Edition - Pathologic: Stage 0 (pTis (DCIS), pN0, cM0, ER+, PR+) - Signed by Loa Socks, NP on 09/22/2022     CHIEF COMPLIANT: Follow-up tamoxifen  INTERVAL HISTORY: Rachael Sharp is a 72 y.o. female is here because of recent diagnosis of right breast DCIS.  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  She has been getting mammograms and MRI surveillance. Patient reports that she has severe hot flashes. But she says she can managed. She states that she has been well. She has some concern about leg cramps that goes up the side of her right leg. She does drink plenty of water. She is having trouble with sleep interruption. She does exercise by walking daily.   ALLERGIES:  is allergic to penicillins and epinephrine.  MEDICATIONS:  Current Outpatient Medications  Medication Sig Dispense Refill   amitriptyline (ELAVIL) 10 MG tablet TAKE 3 TABLETS BY MOUTH AT BEDTIME 270 tablet 3   aspirin 81 MG EC tablet Take 1 tablet by mouth daily.     BIOTIN PO Take 5,000 mcg by mouth  daily.     CALCIUM PO Take 1 tablet by mouth in the morning and at bedtime.     Glucosamine HCl-MSM (GLUCOSAMINE-MSM PO) Take 1 tablet by mouth daily.     Multiple Vitamin (MULTIVITAMIN) tablet Take 2 tablets by mouth daily.      Omega-3 Fatty Acids (OMEGA 3 PO) Take 600 mg by mouth daily.      sertraline (ZOLOFT) 25 MG tablet TAKE 1 TABLET (25 MG TOTAL) BY MOUTH DAILY. 90 tablet 1   tamoxifen (NOLVADEX) 10 MG tablet Take 1 tablet (10 mg total) by mouth daily. 90 tablet 3   tretinoin (RETIN-A) 0.025 % cream Apply topically at bedtime.     No current facility-administered medications for this visit.    PHYSICAL EXAMINATION: ECOG PERFORMANCE STATUS: 1 - Symptomatic but completely ambulatory  Vitals:   12/22/22 1109  BP: 132/74  Pulse: 70  Resp: 16  Temp: 97.9 F (36.6 C)  SpO2: 100%   Filed Weights   12/22/22 1109  Weight: 150 lb 9.6 oz (68.3 kg)      LABORATORY DATA:  I have reviewed the data as listed    Latest Ref Rng & Units 04/17/2022    8:59 AM 01/22/2021    9:58 AM 01/19/2020    8:29 AM  CMP  Glucose 70 - 99 mg/dL 960  454  098   BUN 6 - 23 mg/dL 14  21  26    Creatinine 0.40 - 1.20 mg/dL 1.19  1.07  1.04   Sodium 135 - 145 mEq/L 138  136  138   Potassium 3.5 - 5.1 mEq/L 4.6  4.6  4.7   Chloride 96 - 112 mEq/L 103  100  101   CO2 19 - 32 mEq/L 28  28  31    Calcium 8.4 - 10.5 mg/dL 9.6  16.1  9.8   Total Protein 6.0 - 8.3 g/dL 6.7  7.2  6.7   Total Bilirubin 0.2 - 1.2 mg/dL 0.4  0.4  0.4   Alkaline Phos 39 - 117 U/L 39  49  58   AST 0 - 37 U/L 36  25  24   ALT 0 - 35 U/L 41  27  26     Lab Results  Component Value Date   WBC 6.3 05/19/2019   HGB 15.4 (H) 05/19/2019   HCT 45.9 05/19/2019   MCV 94.4 05/19/2019   PLT 291.0 05/19/2019   NEUTROABS 3.1 05/19/2019    ASSESSMENT & PLAN:  Ductal carcinoma in situ (DCIS) of right breast 05/26/2022: Right lumpectomy: 0.2 cm low-grade DCIS, intraductal papilloma, margins negative, ER 100%, PR 100% CHEK2 gene  mutation  Treatment plan: 1.adjuvant radiation therapy (after discussing pros and cons of radiation she decided to forego radiation) 2. Followed by antiestrogen therapy with tamoxifen 5 years 3.  CHEK2 mutation  Tamoxifen toxicities: Hot flashes: Moderate to severe.  Instructed her to change the tamoxifen from evening to daytime. Leg cramps: Encouraged her to take magnesium gluconate Difficulty staying asleep: We will see if the change in tamoxifen timing makes a difference.  Breast cancer surveillance: Mammogram scheduled for 02/11/2023 Breast MRI will be ordered for April 2025.  Return to clinic in 1 year for follow-up   Orders Placed This Encounter  Procedures   MR BREAST BILATERAL W WO CONTRAST INC CAD    Standing Status:   Future    Standing Expiration Date:   12/22/2023    Order Specific Question:   If indicated for the ordered procedure, I authorize the administration of contrast media per Radiology protocol    Answer:   Yes    Order Specific Question:   What is the patient's sedation requirement?    Answer:   No Sedation    Order Specific Question:   Does the patient have a pacemaker or implanted devices?    Answer:   No    Order Specific Question:   Preferred imaging location?    Answer:   [facility] (table limit-550lbs)    Order Specific Question:   Release to patient    Answer:   Immediate   The patient has a good understanding of the overall plan. she agrees with it. she will call with any problems that may develop before the next visit here. Total time spent: 30 mins including face to face time and time spent for planning, charting and co-ordination of care   Rachael Meek, MD 12/22/22    I Rachael Sharp am acting as a Neurosurgeon for [employer]  I have reviewed the above documentation for accuracy and completeness, and I agree with the above.

## 2022-12-29 ENCOUNTER — Encounter: Payer: Self-pay | Admitting: Plastic Surgery

## 2022-12-29 ENCOUNTER — Ambulatory Visit (INDEPENDENT_AMBULATORY_CARE_PROVIDER_SITE_OTHER): Payer: Self-pay | Admitting: Plastic Surgery

## 2022-12-29 VITALS — BP 117/77 | HR 87

## 2022-12-29 DIAGNOSIS — Z719 Counseling, unspecified: Secondary | ICD-10-CM

## 2022-12-29 DIAGNOSIS — S0083XA Contusion of other part of head, initial encounter: Secondary | ICD-10-CM

## 2022-12-29 NOTE — Progress Notes (Signed)
Patient ID: TAMMICA BERGMANN, female    DOB: 02-23-1951, 72 y.o.   MRN: [id number]   Chief Complaint  Patient presents with   Facial Injury    The patient is a 72 year old female here for evaluation of her face.  In February she got up quickly at night and tripped and fell.  She had a large bruised area on her right cheek as well as a hematoma.  It has gotten better over time but she still has a little bit of redness on the medial portion of her right cheek.  She feels like she has to continually cover it with make-up.  Is wondering if there is any treatment that would help it to resolve more quickly.    Review of Systems  Constitutional: Negative.   HENT: Negative.    Eyes: Negative.   Respiratory: Negative.    Cardiovascular: Negative.   Gastrointestinal: Negative.   Endocrine: Negative.   Genitourinary: Negative.   Musculoskeletal: Negative.     Past Medical History:  Diagnosis Date   Anxiety    Arthritis    finger left index   Breast cancer (HCC) 05/26/2022   GERD (gastroesophageal reflux disease)    Heart murmur    mitral valve prolapse   Hyperlipidemia    denies 10/21/18,not on medication   Hyperplastic colon polyp    Monoallelic mutation of CHEK2 gene in female patient    Vitamin D deficiency     Past Surgical History:  Procedure Laterality Date   ABDOMINAL HYSTERECTOMY  12/2000   BREAST BIOPSY     biopsy x 2 , 1976 and 2002   BREAST BIOPSY  05/21/2022   MM RT RADIOACTIVE SEED LOC MAMMO GUIDE 05/21/2022 [facility]   BREAST CYST ASPIRATION     COLONOSCOPY  11/03/2018   Dr.Pyrtle   COLONOSCOPY  2017   LIPOMA EXCISION  1983   RADIOACTIVE SEED GUIDED EXCISIONAL BREAST BIOPSY Right 05/26/2022   Procedure: RADIOACTIVE SEED GUIDED EXCISIONAL RIGHT BREAST BIOPSY;  Surgeon: Almond Lint, MD;  Location: [facility];  Service: General;  Laterality: Right;   TONSILLECTOMY  1964   TUBAL LIGATION  1982      Current Outpatient Medications:     amitriptyline (ELAVIL) 10 MG tablet, TAKE 3 TABLETS BY MOUTH AT BEDTIME, Disp: 270 tablet, Rfl: 3   aspirin 81 MG EC tablet, Take 1 tablet by mouth daily., Disp: , Rfl:    BIOTIN PO, Take 5,000 mcg by mouth daily., Disp: , Rfl:    CALCIUM PO, Take 1 tablet by mouth in the morning and at bedtime., Disp: , Rfl:    Glucosamine HCl-MSM (GLUCOSAMINE-MSM PO), Take 1 tablet by mouth daily., Disp: , Rfl:    Multiple Vitamin (MULTIVITAMIN) tablet, Take 2 tablets by mouth daily. , Disp: , Rfl:    Omega-3 Fatty Acids (OMEGA 3 PO), Take 600 mg by mouth daily. , Disp: , Rfl:    sertraline (ZOLOFT) 25 MG tablet, TAKE 1 TABLET (25 MG TOTAL) BY MOUTH DAILY., Disp: 90 tablet, Rfl: 1   tamoxifen (NOLVADEX) 10 MG tablet, Take 1 tablet (10 mg total) by mouth daily., Disp: 90 tablet, Rfl: 3   tretinoin (RETIN-A) 0.025 % cream, Apply topically at bedtime., Disp: , Rfl:    Objective:   Vitals:   12/29/22 1042  BP: 117/77  Pulse: 87  SpO2: 99%    Physical Exam Vitals and nursing note reviewed.  Constitutional:      Appearance: Normal appearance.  HENT:     Head: Normocephalic.  Cardiovascular:     Rate and Rhythm: Normal rate.     Pulses: Normal pulses.  Pulmonary:     Effort: Pulmonary effort is normal.  Skin:    General: Skin is warm.     Capillary Refill: Capillary refill takes less than 2 seconds.     Coloration: Skin is not jaundiced or pale.     Findings: Bruising present.  Neurological:     Mental Status: She is alert and oriented to person, place, and time.  Psychiatric:        Mood and Affect: Mood normal.        Behavior: Behavior normal.        Thought Content: Thought content normal.        Judgment: Judgment normal.     Assessment & Plan:  Facial hematoma, initial encounter  I think this will likely get better with time.  She is a really good candidate for the broadband light laser which would help with the redness, broken blood vessels as well as the hyperpigmentation on  her face.  If it anytime she wants a complementary consult with our aesthetician we can arrange that as well.  Patient decided she wants to give it a little more time and will reach back out to [place] when she makes a decision.  Pictures were obtained of the patient and placed in the chart with the patient's or guardian's permission.   Alena Bills Senon Nixon, DO

## 2022-12-31 DIAGNOSIS — M5136 Other intervertebral disc degeneration, lumbar region: Secondary | ICD-10-CM | POA: Diagnosis not present

## 2022-12-31 DIAGNOSIS — M9903 Segmental and somatic dysfunction of lumbar region: Secondary | ICD-10-CM | POA: Diagnosis not present

## 2022-12-31 DIAGNOSIS — M955 Acquired deformity of pelvis: Secondary | ICD-10-CM | POA: Diagnosis not present

## 2022-12-31 DIAGNOSIS — M9905 Segmental and somatic dysfunction of pelvic region: Secondary | ICD-10-CM | POA: Diagnosis not present

## 2023-01-04 DIAGNOSIS — M5136 Other intervertebral disc degeneration, lumbar region: Secondary | ICD-10-CM | POA: Diagnosis not present

## 2023-01-04 DIAGNOSIS — M955 Acquired deformity of pelvis: Secondary | ICD-10-CM | POA: Diagnosis not present

## 2023-01-04 DIAGNOSIS — M9905 Segmental and somatic dysfunction of pelvic region: Secondary | ICD-10-CM | POA: Diagnosis not present

## 2023-01-04 DIAGNOSIS — M9903 Segmental and somatic dysfunction of lumbar region: Secondary | ICD-10-CM | POA: Diagnosis not present

## 2023-01-07 DIAGNOSIS — M9905 Segmental and somatic dysfunction of pelvic region: Secondary | ICD-10-CM | POA: Diagnosis not present

## 2023-01-07 DIAGNOSIS — M955 Acquired deformity of pelvis: Secondary | ICD-10-CM | POA: Diagnosis not present

## 2023-01-07 DIAGNOSIS — M9903 Segmental and somatic dysfunction of lumbar region: Secondary | ICD-10-CM | POA: Diagnosis not present

## 2023-01-07 DIAGNOSIS — M5136 Other intervertebral disc degeneration, lumbar region: Secondary | ICD-10-CM | POA: Diagnosis not present

## 2023-01-09 DIAGNOSIS — Z23 Encounter for immunization: Secondary | ICD-10-CM | POA: Diagnosis not present

## 2023-01-14 DIAGNOSIS — M5136 Other intervertebral disc degeneration, lumbar region: Secondary | ICD-10-CM | POA: Diagnosis not present

## 2023-01-14 DIAGNOSIS — M955 Acquired deformity of pelvis: Secondary | ICD-10-CM | POA: Diagnosis not present

## 2023-01-14 DIAGNOSIS — M9903 Segmental and somatic dysfunction of lumbar region: Secondary | ICD-10-CM | POA: Diagnosis not present

## 2023-01-14 DIAGNOSIS — M9905 Segmental and somatic dysfunction of pelvic region: Secondary | ICD-10-CM | POA: Diagnosis not present

## 2023-01-16 DIAGNOSIS — M955 Acquired deformity of pelvis: Secondary | ICD-10-CM | POA: Diagnosis not present

## 2023-01-16 DIAGNOSIS — M9905 Segmental and somatic dysfunction of pelvic region: Secondary | ICD-10-CM | POA: Diagnosis not present

## 2023-01-16 DIAGNOSIS — M9903 Segmental and somatic dysfunction of lumbar region: Secondary | ICD-10-CM | POA: Diagnosis not present

## 2023-01-16 DIAGNOSIS — M5136 Other intervertebral disc degeneration, lumbar region: Secondary | ICD-10-CM | POA: Diagnosis not present

## 2023-01-21 DIAGNOSIS — M9903 Segmental and somatic dysfunction of lumbar region: Secondary | ICD-10-CM | POA: Diagnosis not present

## 2023-01-21 DIAGNOSIS — M5136 Other intervertebral disc degeneration, lumbar region: Secondary | ICD-10-CM | POA: Diagnosis not present

## 2023-01-21 DIAGNOSIS — M9905 Segmental and somatic dysfunction of pelvic region: Secondary | ICD-10-CM | POA: Diagnosis not present

## 2023-01-21 DIAGNOSIS — M955 Acquired deformity of pelvis: Secondary | ICD-10-CM | POA: Diagnosis not present

## 2023-01-24 DIAGNOSIS — Z23 Encounter for immunization: Secondary | ICD-10-CM | POA: Diagnosis not present

## 2023-01-26 DIAGNOSIS — M5136 Other intervertebral disc degeneration, lumbar region: Secondary | ICD-10-CM | POA: Diagnosis not present

## 2023-01-26 DIAGNOSIS — M9903 Segmental and somatic dysfunction of lumbar region: Secondary | ICD-10-CM | POA: Diagnosis not present

## 2023-01-26 DIAGNOSIS — M955 Acquired deformity of pelvis: Secondary | ICD-10-CM | POA: Diagnosis not present

## 2023-01-26 DIAGNOSIS — M9905 Segmental and somatic dysfunction of pelvic region: Secondary | ICD-10-CM | POA: Diagnosis not present

## 2023-01-28 DIAGNOSIS — M9905 Segmental and somatic dysfunction of pelvic region: Secondary | ICD-10-CM | POA: Diagnosis not present

## 2023-01-28 DIAGNOSIS — M5136 Other intervertebral disc degeneration, lumbar region: Secondary | ICD-10-CM | POA: Diagnosis not present

## 2023-01-28 DIAGNOSIS — M9903 Segmental and somatic dysfunction of lumbar region: Secondary | ICD-10-CM | POA: Diagnosis not present

## 2023-01-28 DIAGNOSIS — M955 Acquired deformity of pelvis: Secondary | ICD-10-CM | POA: Diagnosis not present

## 2023-02-05 ENCOUNTER — Other Ambulatory Visit: Payer: Self-pay | Admitting: Family Medicine

## 2023-02-11 ENCOUNTER — Ambulatory Visit
Admission: RE | Admit: 2023-02-11 | Discharge: 2023-02-11 | Disposition: A | Discharge status: Home / Self Care | Payer: Medicare Other | Source: Ambulatory Visit | Attending: Adult Health | Admitting: Adult Health

## 2023-02-11 DIAGNOSIS — D0511 Intraductal carcinoma in situ of right breast: Secondary | ICD-10-CM | POA: Diagnosis not present

## 2023-02-11 DIAGNOSIS — R92323 Mammographic fibroglandular density, bilateral breasts: Secondary | ICD-10-CM | POA: Diagnosis not present

## 2023-02-22 ENCOUNTER — Encounter: Payer: Self-pay | Admitting: Family Medicine

## 2023-02-24 DIAGNOSIS — M5136 Other intervertebral disc degeneration, lumbar region with discogenic back pain only: Secondary | ICD-10-CM | POA: Diagnosis not present

## 2023-02-24 DIAGNOSIS — M9905 Segmental and somatic dysfunction of pelvic region: Secondary | ICD-10-CM | POA: Diagnosis not present

## 2023-02-24 DIAGNOSIS — M955 Acquired deformity of pelvis: Secondary | ICD-10-CM | POA: Diagnosis not present

## 2023-02-24 DIAGNOSIS — M9903 Segmental and somatic dysfunction of lumbar region: Secondary | ICD-10-CM | POA: Diagnosis not present

## 2023-02-24 MED ORDER — MOLNUPIRAVIR EUA 200MG CAPSULE: 4.0000 | ORAL_CAPSULE | Freq: Two times a day (BID) | ORAL | 0 refills | Status: AC

## 2023-02-27 DIAGNOSIS — M9905 Segmental and somatic dysfunction of pelvic region: Secondary | ICD-10-CM | POA: Diagnosis not present

## 2023-02-27 DIAGNOSIS — M5136 Other intervertebral disc degeneration, lumbar region with discogenic back pain only: Secondary | ICD-10-CM | POA: Diagnosis not present

## 2023-02-27 DIAGNOSIS — M955 Acquired deformity of pelvis: Secondary | ICD-10-CM | POA: Diagnosis not present

## 2023-02-27 DIAGNOSIS — M9903 Segmental and somatic dysfunction of lumbar region: Secondary | ICD-10-CM | POA: Diagnosis not present

## 2023-03-01 DIAGNOSIS — M9903 Segmental and somatic dysfunction of lumbar region: Secondary | ICD-10-CM | POA: Diagnosis not present

## 2023-03-01 DIAGNOSIS — M5136 Other intervertebral disc degeneration, lumbar region with discogenic back pain only: Secondary | ICD-10-CM | POA: Diagnosis not present

## 2023-03-01 DIAGNOSIS — M955 Acquired deformity of pelvis: Secondary | ICD-10-CM | POA: Diagnosis not present

## 2023-03-01 DIAGNOSIS — M9905 Segmental and somatic dysfunction of pelvic region: Secondary | ICD-10-CM | POA: Diagnosis not present

## 2023-03-04 DIAGNOSIS — M9905 Segmental and somatic dysfunction of pelvic region: Secondary | ICD-10-CM | POA: Diagnosis not present

## 2023-03-04 DIAGNOSIS — M5136 Other intervertebral disc degeneration, lumbar region with discogenic back pain only: Secondary | ICD-10-CM | POA: Diagnosis not present

## 2023-03-04 DIAGNOSIS — M9903 Segmental and somatic dysfunction of lumbar region: Secondary | ICD-10-CM | POA: Diagnosis not present

## 2023-03-04 DIAGNOSIS — M955 Acquired deformity of pelvis: Secondary | ICD-10-CM | POA: Diagnosis not present

## 2023-03-08 ENCOUNTER — Ambulatory Visit (INDEPENDENT_AMBULATORY_CARE_PROVIDER_SITE_OTHER): Payer: Medicare Other

## 2023-03-08 VITALS — Ht 64.75 in | Wt 150.0 lb

## 2023-03-08 DIAGNOSIS — Z0000 Encounter for general adult medical examination without abnormal findings: Secondary | ICD-10-CM

## 2023-03-08 NOTE — Progress Notes (Signed)
Subjective:   Rachael Sharp is a 72 y.o. female who presents for Medicare Annual (Subsequent) preventive examination.  Visit Complete: Virtual I connected with  Rachael Sharp on 03/08/23 by a audio enabled telemedicine application and verified that I am speaking with the correct person using two identifiers.  Patient Location: Home  Provider Location: Office/Clinic  I discussed the limitations of evaluation and management by telemedicine. The patient expressed understanding and agreed to proceed.  Vital Signs: Because this visit was a virtual/telehealth visit, some criteria may be missing or patient reported. Any vitals not documented were not able to be obtained and vitals that have been documented are patient reported.  Patient Medicare AWV questionnaire was completed by the patient on 03/04/23; I have confirmed that all information answered by patient is correct and no changes since this date.  Cardiac Risk Factors include: advanced age (>35men, >24 women)    Objective:    Today's Vitals   03/04/23 1336 03/08/23 1119  Weight:  150 lb (68 kg)  Height:  5' 4.75" (1.645 m)  PainSc: 3     Body mass index is 25.15 kg/m.     03/08/2023   11:33 AM 12/22/2022   11:42 AM 09/22/2022   10:48 AM 06/17/2022   10:11 AM 06/04/2022    1:50 PM 05/26/2022    6:42 AM 05/19/2022   11:53 AM  Advanced Directives  Does Patient Have a Medical Advance Directive? Yes Yes Yes Yes Yes Yes Yes  Type of Estate agent of [place];Living will Healthcare Power of [employer] of [place];Living will Living will Healthcare Power of [place];Living will  Healthcare Power of Attorney  Does patient want to make changes to medical advance directive?     No - Patient declined  No - Patient declined  Copy of Healthcare Power of Attorney in Chart? No - copy requested   No - copy requested No - copy requested  No - copy requested  Would patient like information on  creating a medical advance directive?     No - Patient declined  No - Patient declined    Current Medications (verified) Outpatient Encounter Medications as of 03/08/2023  Medication Sig   amitriptyline (ELAVIL) 10 MG tablet TAKE 3 TABLETS BY MOUTH AT BEDTIME   aspirin 81 MG EC tablet Take 1 tablet by mouth daily.   BIOTIN PO Take 5,000 mcg by mouth daily.   CALCIUM PO Take 1 tablet by mouth in the morning and at bedtime.   Multiple Vitamin (MULTIVITAMIN) tablet Take 2 tablets by mouth daily.    Omega-3 Fatty Acids (OMEGA 3 PO) Take 600 mg by mouth daily.    sertraline (ZOLOFT) 25 MG tablet TAKE 1 TABLET (25 MG TOTAL) BY MOUTH DAILY.   tamoxifen (NOLVADEX) 10 MG tablet Take 1 tablet (10 mg total) by mouth daily.   tretinoin (RETIN-A) 0.025 % cream Apply topically at bedtime.   Glucosamine HCl-MSM (GLUCOSAMINE-MSM PO) Take 1 tablet by mouth daily.   No facility-administered encounter medications on file as of 03/08/2023.    Allergies (verified) Penicillins and Epinephrine   History: Past Medical History:  Diagnosis Date   Anxiety    Arthritis    finger left index   Breast cancer (HCC) 05/26/2022   GERD (gastroesophageal reflux disease)    Heart murmur    mitral valve prolapse   Hyperlipidemia    denies 10/21/18,not on medication   Hyperplastic colon polyp    Monoallelic mutation of  CHEK2 gene in female patient    Vitamin D deficiency    Past Surgical History:  Procedure Laterality Date   ABDOMINAL HYSTERECTOMY  12/2000   BREAST BIOPSY     biopsy x 2 , 1976 and 2002   BREAST BIOPSY  05/21/2022   MM RT RADIOACTIVE SEED LOC MAMMO GUIDE 05/21/2022 [facility]   BREAST CYST ASPIRATION     BREAST LUMPECTOMY Right 05/2022   COLONOSCOPY  11/03/2018   Dr.Pyrtle   COLONOSCOPY  2017   LIPOMA EXCISION  1983   RADIOACTIVE SEED GUIDED EXCISIONAL BREAST BIOPSY Right 05/26/2022   Procedure: RADIOACTIVE SEED GUIDED EXCISIONAL RIGHT BREAST BIOPSY;  Surgeon: Almond Lint, MD;   Location: [facility];  Service: General;  Laterality: Right;   TONSILLECTOMY  1964   TUBAL LIGATION  1982   Family History  Problem Relation Age of Onset   COPD Sister    Heart disease Sister 27       massive CVA,     Stroke Sister    Stroke Mother    COPD Mother    Heart disease Father    Hyperlipidemia Father    Breast cancer Paternal Aunt    Breast cancer Paternal Aunt    Breast cancer Paternal Aunt    Breast cancer Paternal Aunt    Colon cancer Neg Hx    Colon polyps Neg Hx    Esophageal cancer Neg Hx    Rectal cancer Neg Hx    Stomach cancer Neg Hx    Social History   Socioeconomic History   Marital status: Married    Spouse name: Not on file   Number of children: Not on file   Years of education: Not on file   Highest education level: Associate degree: academic program  Occupational History   Occupation: retired  Tobacco Use   Smoking status: Former    Current packs/day: 0.00    Average packs/day: 0.3 packs/day for 20.0 years (5.0 ttl pk-yrs)    Types: Cigarettes    Start date: 06/02/1969    Quit date: 06/02/1989    Years since quitting: 33.7   Smokeless tobacco: Never   Tobacco comments:    Quit 1991  Vaping Use   Vaping status: Never Used  Substance and Sexual Activity   Alcohol use: Yes    Alcohol/week: 7.0 standard drinks of alcohol    Types: 7 Standard drinks or equivalent per week    Comment: nightly wine   Drug use: No   Sexual activity: Not Currently    Partners: Male    Comment: 1st intercourse-19, partners- 4, married- 40 yr s  Other Topics Concern   Not on file  Social History Narrative   Not on file   Social Determinants of Health   Financial Resource Strain: Low Risk  (03/04/2023)   Overall Financial Resource Strain (CARDIA)    Difficulty of Paying Living Expenses: Not hard at all  Food Insecurity: No Food Insecurity (03/04/2023)   Hunger Vital Sign    Worried About Running Out of Food in the Last Year: Never true     Ran Out of Food in the Last Year: Never true  Transportation Needs: No Transportation Needs (03/04/2023)   PRAPARE - Administrator, Civil Service (Medical): No    Lack of Transportation (Non-Medical): No  Physical Activity: Sufficiently Active (03/04/2023)   Exercise Vital Sign    Days of Exercise per Week: 4 days    Minutes of Exercise  per Session: 40 min  Stress: No Stress Concern Present (03/04/2023)   [employer] of Occupational Health - Occupational Stress Questionnaire    Feeling of Stress : Not at all  Social Connections: Unknown (03/04/2023)   Social Connection and Isolation Panel [NHANES]    Frequency of Communication with Friends and Family: Three times a week    Frequency of Social Gatherings with Friends and Family: Once a week    Attends Religious Services: Patient declined    Database administrator or Organizations: Yes    Attends Engineer, structural: More than 4 times per year    Marital Status: Married    Tobacco Counseling Counseling given: Not Answered Tobacco comments: Quit 1991   Clinical Intake:  Pre-visit preparation completed: No  Pain : 0-10 Pain Score: 3  Pain Type: Chronic pain Pain Location: Hip Pain Orientation: Left Pain Descriptors / Indicators: Aching Pain Onset: More than a month ago Pain Frequency: Intermittent Pain Relieving Factors: chiropractic treatment  Pain Relieving Factors: chiropractic treatment  BMI - recorded: 25.15 Nutritional Status: BMI 25 -29 Overweight Nutritional Risks: None Diabetes: No  How often do you need to have someone help you when you read instructions, pamphlets, or other written materials from your doctor or pharmacy?: 1 - Never  Interpreter Needed?: No  Comments: lives with husband Information entered by :: B.Mayli Covington,LPN   Activities of Daily Living    03/04/2023    1:36 PM 05/26/2022    6:45 AM  In your present state of health, do you have any difficulty performing  the following activities:  Hearing? 0 0  Vision? 0 1  Difficulty concentrating or making decisions? 0 0  Walking or climbing stairs? 0 0  Dressing or bathing? 0 0  Doing errands, shopping? 0   Preparing Food and eating ? N   Using the Toilet? N   In the past six months, have you accidently leaked urine? N   Do you have problems with loss of bowel control? N   Managing your Medications? N   Managing your Finances? N   Housekeeping or managing your Housekeeping? N     Patient Care Team: Excell Seltzer, MD as PCP - General (Family Medicine) Serena Croissant, MD as Consulting Physician (Hematology and Oncology) Almond Lint, MD as Consulting Physician (General Surgery) Lockie Mola, MD as Referring Physician (Ophthalmology)  Indicate any recent Medical Services you may have received from other than [facility] providers in the past year (date may be approximate).     Assessment:   This is a routine wellness examination for Rachael Sharp.  Hearing/Vision screen Hearing Screening - Comments:: Pt says her hearing is good Vision Screening - Comments:: Pt says she wears glasses and sees well [facility]   Goals Addressed             This Visit's Progress    COMPLETED: DIET - EAT MORE FRUITS AND VEGETABLES   On track    COMPLETED: Patient Stated   On track    Starting 01/12/2018, I will continue to take medications as prescribed.      COMPLETED: Patient Stated   On track    01/23/2020, I will continue to walk and weight lift 4-5 days a week for 45-90 minutes.     Patient Stated       No new goals:happy with physical health     COMPLETED: Weight (lb) < 200 lb (90.7 kg)   150 lb (68 kg)    01/20/2019,  wants to lose 10 pounds       Depression Screen    03/08/2023   11:27 AM 11/11/2022    9:12 AM 06/24/2022    2:20 PM 06/04/2022    2:09 PM 03/04/2022    9:06 AM 12/23/2021   11:28 AM 09/05/2021   10:26 AM  PHQ 2/9 Scores  PHQ - 2 Score 0 0 0 0 0 0 2  PHQ- 9 Score  1 2  0  5     Fall Risk    03/04/2023    1:36 PM 11/11/2022    9:12 AM 03/04/2022    9:08 AM 12/23/2021   11:28 AM 01/25/2021    9:25 AM  Fall Risk   Falls in the past year? 1 0 0 0 1  Number falls in past yr: 0 0 0  0  Injury with Fall? 1 0 0  0  Risk for fall due to : No Fall Risks No Fall Risks No Fall Risks    Follow up Education provided;Falls prevention discussed Falls evaluation completed Falls prevention discussed;Falls evaluation completed Falls evaluation completed     MEDICARE RISK AT HOME: Medicare Risk at Home Any stairs in or around the home?: Yes If so, are there any without handrails?: No Home free of loose throw rugs in walkways, pet beds, electrical cords, etc?: Yes Adequate lighting in your home to reduce risk of falls?: Yes Life alert?: No Use of a cane, walker or w/c?: No Grab bars in the bathroom?: No Shower chair or bench in shower?: No Elevated toilet seat or a handicapped toilet?: No  TIMED UP AND GO:  Was the test performed?  No    Cognitive Function:    01/23/2020   10:02 AM 01/20/2019    2:17 PM 01/12/2018    8:06 AM  MMSE - Mini Mental State Exam  Orientation to time 5 5 5   Orientation to Place 5 5 5   Registration 3 3 3   Attention/ Calculation 5 5 0  Recall 3 3 3   Language- name 2 objects  0 0  Language- repeat 1 1 1   Language- follow 3 step command  0 3  Language- read & follow direction  0 0  Write a sentence  0 0  Copy design  0 0  Total score  22 20        03/08/2023   11:36 AM 03/04/2022    9:09 AM 01/25/2021    9:27 AM  6CIT Screen  What Year? 0 points 0 points 0 points  What month? 0 points 0 points 0 points  What time? 0 points 0 points 0 points  Count back from 20 0 points 0 points 0 points  Months in reverse 0 points 0 points 0 points  Repeat phrase 0 points 0 points 0 points  Total Score 0 points 0 points 0 points    Immunizations Immunization History  Administered Date(s) Administered   Fluad Quad(high Dose 65+) 01/24/2019,  01/01/2020, 01/16/2021, 01/11/2022   Influenza Split 03/08/2012, 02/11/2014, 02/09/2015   Influenza Whole 04/05/2011   Influenza, High Dose Seasonal PF 02/25/2017, 01/11/2018, 01/09/2023   Influenza,inj,Quad PF,6+ Mos 03/13/2013   Influenza-Unspecified 01/30/2016   PFIZER Comirnaty(Gray Top)Covid-19 Tri-Sucrose Vaccine 02/01/2022   PFIZER(Purple Top)SARS-COV-2 Vaccination 06/17/2019, 07/12/2019, 03/09/2020   Pfizer Covid-19 Vaccine Bivalent Booster 38yrs & up 08/13/2020, 08/30/2021   Pfizer(Comirnaty)Fall Seasonal Vaccine 12 years and older 01/24/2023   Pneumococcal Conjugate-13 01/08/2017   Pneumococcal Polysaccharide-23 03/17/2018   Tdap 11/09/2010  Zoster Recombinant(Shingrix) 02/07/2020, 06/04/2020   Zoster, Live 03/08/2012    TDAP status: Up to date  Flu Vaccine status: Up to date  Pneumococcal vaccine status: Up to date  Covid-19 vaccine status: Completed vaccines  Qualifies for Shingles Vaccine? Yes   Zostavax completed Yes   Shingrix Completed?: Yes  Screening Tests Health Maintenance  Topic Date Due   DTaP/Tdap/Td (2 - Td or Tdap) 03/07/2024 (Originally 11/08/2020)   DEXA SCAN  03/11/2023   COVID-19 Vaccine (8 - 2023-24 season) 03/21/2023   MAMMOGRAM  02/11/2024   Medicare Annual Wellness (AWV)  03/07/2024   Colonoscopy  12/09/2024   Pneumonia Vaccine 85+ Years old  Completed   INFLUENZA VACCINE  Completed   Hepatitis C Screening  Completed   Zoster Vaccines- Shingrix  Completed   HPV VACCINES  Aged Out    Health Maintenance  There are no preventive care reminders to display for this patient.   Colorectal cancer screening: Type of screening: Colonoscopy. Completed 12/09/21. Repeat every 5 years  Mammogram status: Completed 02/11/23. Repeat every year  Bone Density status: Ordered yes. Pt provided with contact info and advised to call to schedule appt. [facility]  Lung Cancer Screening: (Low Dose CT Chest recommended if Age 50-80 years, 20  pack-year currently smoking OR have quit w/in 15years.) does not qualify.   Lung Cancer Screening Referral: no  Additional Screening:  Hepatitis C Screening: does not qualify; Completed 08/08/2015  Vision Screening: Recommended annual ophthalmology exams for early detection of glaucoma and other disorders of the eye. Is the patient up to date with their annual eye exam?  Yes  Who is the provider or what is the name of the office in which the patient attends annual eye exams? Dr Inez Pilgrim  If pt is not established with a provider, would they like to be referred to a provider to establish care? No .   Dental Screening: Recommended annual dental exams for proper oral hygiene  Diabetic Foot Exam: n/a  Community Resource Referral / Chronic Care Management: CRR required this visit?  No   CCM required this visit?  No     Plan:     I have personally reviewed and noted the following in the patient's chart:   Medical and social history Use of alcohol, tobacco or illicit drugs  Current medications and supplements including opioid prescriptions. Patient is not currently taking opioid prescriptions. Functional ability and status Nutritional status Physical activity Advanced directives List of other physicians Hospitalizations, surgeries, and ER visits in previous 12 months Vitals Screenings to include cognitive, depression, and falls Referrals and appointments  In addition, I have reviewed and discussed with patient certain preventive protocols, quality metrics, and best practice recommendations. A written personalized care plan for preventive services as well as general preventive health recommendations were provided to patient.     Sue Lush, LPN   [id number]   After Visit Summary: (MyChart) Due to this being a telephonic visit, the after visit summary with patients personalized plan was offered to patient via MyChart   Nurse Notes: The patient states she is on  Tamoxifen and reads about the need to monitor blood levels. She has a LAB appt on 04/22/23 and desires to have them done at that time. She otherwise states she is doing well and has no concerns or questions at this time.

## 2023-03-08 NOTE — Patient Instructions (Signed)
Rachael Sharp , Thank you for taking time to come for your Medicare Wellness Visit. I appreciate your ongoing commitment to your health goals. Please review the following plan we discussed and let me know if I can assist you in the future.   Referrals/Orders/Follow-Ups/Clinician Recommendations:   You have an order for:  []   2D Mammogram  []   3D Mammogram  [x]   Bone Density     Please call for appointment:  [facility] [address] [place], [place], [place] [address] [phone]  [facility] [address] [place], [place] [address] [phone]  [facility] [address] [place], [place] [address] [phone]  [facility] [address] [place], [place] [address] [phone]  [facility] [address] [place], [place] [address] [phone]    Make sure to wear two-piece clothing.  No lotions, powders, or deodorants the day of the appointment. Make sure to bring picture ID and insurance card.  Bring list of medications you are currently taking including any supplements.   Schedule your [place] screening mammogram through MyChart!   Log into your MyChart account.  Go to 'Visit' (or 'Appointments' if on mobile App) --> Schedule an Appointment  Under 'Select a Reason for Visit' choose the Mammogram Screening option.  Complete the pre-visit questions and select the time and place that best fits your schedule.    This is a list of the screening recommended for you and due dates:  Health Maintenance  Topic Date Due   DTaP/Tdap/Td vaccine (2 - Td or Tdap) 11/08/2020   DEXA scan (bone density measurement)  03/11/2023   COVID-19 Vaccine (8 - 2023-24 season) 03/21/2023   Mammogram  02/11/2024   Medicare Annual Wellness Visit  03/07/2024   Colon Cancer Screening  12/09/2024   Pneumonia Vaccine  Completed   Flu Shot  Completed   Hepatitis C  Screening  Completed   Zoster (Shingles) Vaccine  Completed   HPV Vaccine  Aged Out    Advanced directives: (Copy Requested) Please bring a copy of your health care power of attorney and living will to the office to be added to your chart at your convenience.  Next Medicare Annual Wellness Visit scheduled for next year: Yes 03/09/24 @ 1:30am telephone

## 2023-03-09 ENCOUNTER — Telehealth: Payer: Self-pay | Admitting: *Deleted

## 2023-03-09 DIAGNOSIS — M955 Acquired deformity of pelvis: Secondary | ICD-10-CM | POA: Diagnosis not present

## 2023-03-09 DIAGNOSIS — M5136 Other intervertebral disc degeneration, lumbar region with discogenic back pain only: Secondary | ICD-10-CM | POA: Diagnosis not present

## 2023-03-09 DIAGNOSIS — M9903 Segmental and somatic dysfunction of lumbar region: Secondary | ICD-10-CM | POA: Diagnosis not present

## 2023-03-09 DIAGNOSIS — M9905 Segmental and somatic dysfunction of pelvic region: Secondary | ICD-10-CM | POA: Diagnosis not present

## 2023-03-09 NOTE — Telephone Encounter (Signed)
Left message for Rachael Sharp to return call to office about lab orders.

## 2023-03-09 NOTE — Telephone Encounter (Signed)
-----   Message from Kerby Nora sent at 03/09/2023  1:08 PM EDT ----- Please call the patient, this has been brought to my attention that she is interested in additional lab testing given she is on tamoxifen.  Let her know that liver function and calcium are routinely monitored in her case.  The only thing we have not done in a few years that can be checked on tamoxifen would be a CBC.  Were there any other labs that she wanted added to her upcoming blood work prior to her physical? ----- Message ----- From: Eustaquio Boyden, MD Sent: 03/08/2023   2:09 PM EDT To: Excell Seltzer, MD  Fyi pt asking about tamoxifen blood monitoring? See AMW visit from today. Thanks, Wynona Canes

## 2023-03-10 ENCOUNTER — Encounter: Payer: Self-pay | Admitting: Family Medicine

## 2023-03-10 DIAGNOSIS — R7303 Prediabetes: Secondary | ICD-10-CM

## 2023-03-10 DIAGNOSIS — E559 Vitamin D deficiency, unspecified: Secondary | ICD-10-CM

## 2023-03-10 DIAGNOSIS — Z79899 Other long term (current) drug therapy: Secondary | ICD-10-CM

## 2023-03-10 DIAGNOSIS — E7800 Pure hypercholesterolemia, unspecified: Secondary | ICD-10-CM

## 2023-03-10 NOTE — Telephone Encounter (Signed)
Spoke with Rachael Sharp.  She will MyChart the test she would like done.  It was test recommended by Dr. Pamelia Hoit.

## 2023-03-11 DIAGNOSIS — M955 Acquired deformity of pelvis: Secondary | ICD-10-CM | POA: Diagnosis not present

## 2023-03-11 DIAGNOSIS — M9905 Segmental and somatic dysfunction of pelvic region: Secondary | ICD-10-CM | POA: Diagnosis not present

## 2023-03-11 DIAGNOSIS — M5136 Other intervertebral disc degeneration, lumbar region with discogenic back pain only: Secondary | ICD-10-CM | POA: Diagnosis not present

## 2023-03-11 DIAGNOSIS — M9903 Segmental and somatic dysfunction of lumbar region: Secondary | ICD-10-CM | POA: Diagnosis not present

## 2023-03-15 DIAGNOSIS — M5136 Other intervertebral disc degeneration, lumbar region with discogenic back pain only: Secondary | ICD-10-CM | POA: Diagnosis not present

## 2023-03-15 DIAGNOSIS — M9903 Segmental and somatic dysfunction of lumbar region: Secondary | ICD-10-CM | POA: Diagnosis not present

## 2023-03-15 DIAGNOSIS — M9905 Segmental and somatic dysfunction of pelvic region: Secondary | ICD-10-CM | POA: Diagnosis not present

## 2023-03-15 DIAGNOSIS — M955 Acquired deformity of pelvis: Secondary | ICD-10-CM | POA: Diagnosis not present

## 2023-03-25 ENCOUNTER — Ambulatory Visit: Payer: Medicare Other | Admitting: Family Medicine

## 2023-03-26 ENCOUNTER — Encounter: Payer: Self-pay | Admitting: Nurse Practitioner

## 2023-03-26 ENCOUNTER — Ambulatory Visit (INDEPENDENT_AMBULATORY_CARE_PROVIDER_SITE_OTHER): Payer: Medicare Other | Admitting: Nurse Practitioner

## 2023-03-26 VITALS — BP 128/82 | HR 90 | Temp 99.3°F | Ht 64.0 in | Wt 154.2 lb

## 2023-03-26 DIAGNOSIS — R051 Acute cough: Secondary | ICD-10-CM | POA: Insufficient documentation

## 2023-03-26 DIAGNOSIS — B349 Viral infection, unspecified: Secondary | ICD-10-CM | POA: Diagnosis not present

## 2023-03-26 DIAGNOSIS — R52 Pain, unspecified: Secondary | ICD-10-CM | POA: Diagnosis not present

## 2023-03-26 DIAGNOSIS — J029 Acute pharyngitis, unspecified: Secondary | ICD-10-CM | POA: Insufficient documentation

## 2023-03-26 DIAGNOSIS — R0982 Postnasal drip: Secondary | ICD-10-CM | POA: Diagnosis not present

## 2023-03-26 LAB — POC COVID19 BINAXNOW: SARS Coronavirus 2 Ag: NEGATIVE

## 2023-03-26 LAB — POCT FLU A/B STATUS
Influenza A, POC: NEGATIVE
Influenza B, POC: NEGATIVE

## 2023-03-26 MED ORDER — BENZONATATE 100 MG PO CAPS: 100.0000 mg | ORAL_CAPSULE | Freq: Three times a day (TID) | ORAL | 0 refills | Status: DC | PRN

## 2023-03-26 MED ORDER — FLUTICASONE PROPIONATE 50 MCG/ACT NA SUSP: 2.0000 | Freq: Every day | NASAL | 0 refills | Status: DC

## 2023-03-26 NOTE — Patient Instructions (Addendum)
Nice to see you today Covid and flu test was negative in office I recommend that you retest on Sunday You can take tylenol as needed OTC for body aches, fever and sore throat  I have sent in some cough medication and nasal spray   Tylenol dosage: you can use 1,000mg  every 6 hours if you need it

## 2023-03-26 NOTE — Assessment & Plan Note (Signed)
Flu and COVID test in office.  Patient can use over-the-counter analgesics such as Tylenol as directed

## 2023-03-26 NOTE — Assessment & Plan Note (Signed)
Flu and COVID test in office.  Tessalon Perles 100 mg 3 times daily as needed

## 2023-03-26 NOTE — Progress Notes (Signed)
Acute Office Visit  Subjective:     Patient ID: ALEASHA FELLING, female    DOB: 11-Jul-1950, 72 y.o.   MRN: [id number]  Chief Complaint  Patient presents with   Cough    Pt complains of dry cough that started yesterday afternoon, headache, body aches, and pt states she can feel mucus in throat.      Patient is in today for cough with a history of breast cancer, heart murmur, HLD, GERD, former tobacco use.  Symptoms started yesterday Covid vaccine: utd Flu vaccine: utd   No sick contacts that she knows. States that she just got back from [place] yesterday and was in the [employer] and airplane  She has tried extra strength Tylenol without relief She did test for Covid this morning that was negative  Review of Systems  Constitutional:  Positive for chills and malaise/fatigue. Negative for fever.       Decreased appetite   HENT:  Positive for ear pain and sore throat. Negative for ear discharge.   Respiratory:  Positive for cough and shortness of breath. Negative for sputum production and wheezing.   Gastrointestinal:  Negative for abdominal pain, diarrhea, nausea and vomiting.  Musculoskeletal:  Positive for joint pain and myalgias.  Neurological:  Positive for headaches.        Objective:    BP 128/82   Pulse 90   Temp 99.3 F (37.4 C) (Oral)   Ht 5\' 4"  (1.626 m)   Wt 154 lb 3.2 oz (69.9 kg)   SpO2 97%   BMI 26.47 kg/m    Physical Exam Vitals and nursing note reviewed.  Constitutional:      Appearance: Normal appearance.  HENT:     Right Ear: Ear canal and external ear normal. There is impacted cerumen.     Left Ear: Tympanic membrane, ear canal and external ear normal.     Nose:     Right Sinus: Maxillary sinus tenderness and frontal sinus tenderness present.     Left Sinus: Maxillary sinus tenderness and frontal sinus tenderness present.     Mouth/Throat:     Mouth: Mucous membranes are moist.     Pharynx: Posterior oropharyngeal erythema present.   Cardiovascular:     Rate and Rhythm: Normal rate and regular rhythm.     Heart sounds: Normal heart sounds.  Pulmonary:     Effort: Pulmonary effort is normal.     Breath sounds: Normal breath sounds.  Lymphadenopathy:     Cervical: No cervical adenopathy.  Neurological:     Mental Status: She is alert.     Results for orders placed or performed in visit on 03/26/23  POC COVID-19  Result Value Ref Range   SARS Coronavirus 2 Ag Negative Negative  POCT Flu A & B Status  Result Value Ref Range   Influenza A, POC Negative Negative   Influenza B, POC Negative Negative        Assessment & Plan:   Problem List Items Addressed This Visit       Other   Acute cough - Primary    Flu and COVID test in office.  Tessalon Perles 100 mg 3 times daily as needed      Relevant Medications   benzonatate (TESSALON) 100 MG capsule   Other Relevant Orders   POC COVID-19 (Completed)   POCT Flu A & B Status (Completed)   Sore throat    Flu and COVID test in office.  Patient can do  warm salt water gargles, throat lozenges, warm liquids with honey.  Patient can also use over-the-counter analgesics such as Tylenol      Relevant Orders   POC COVID-19 (Completed)   POCT Flu A & B Status (Completed)   Body aches    Flu and COVID test in office.  Patient can use over-the-counter analgesics such as Tylenol as directed      Relevant Orders   POC COVID-19 (Completed)   POCT Flu A & B Status (Completed)   PND (post-nasal drip)    Likely contributing to patient's sore throat will do fluticasone nasal spray 50 mcg per actuation 2 sprays each nostril daily.      Relevant Medications   fluticasone (FLONASE) 50 MCG/ACT nasal spray   Viral illness    Flu and COVID test negative in office.  Did encourage patient to read do COVID test at home on Sunday states she has not test.  Will do symptomatic treatment currently Flonase for postnasal drip and Tessalon Perles for cough.  Patient can give use  over-the-counter analgesic such as Tylenol for body aches fever and sore throat.  Rest drink plenty of fluids       Meds ordered this encounter  Medications   fluticasone (FLONASE) 50 MCG/ACT nasal spray    Sig: Place 2 sprays into both nostrils daily.    Dispense:  16 g    Refill:  0    Order Specific Question:   Supervising Provider    Answer:   Milinda Antis MARNE A [[id number]]   benzonatate (TESSALON) 100 MG capsule    Sig: Take 1 capsule (100 mg total) by mouth 3 (three) times daily as needed for cough.    Dispense:  21 capsule    Refill:  0    Order Specific Question:   Supervising Provider    Answer:   TOWER, MARNE A [[id number]]    Return if symptoms worsen or fail to improve.  Audria Nine, NP

## 2023-03-26 NOTE — Assessment & Plan Note (Signed)
Flu and COVID test negative in office.  Did encourage patient to read do COVID test at home on Sunday states she has not test.  Will do symptomatic treatment currently Flonase for postnasal drip and Tessalon Perles for cough.  Patient can give use over-the-counter analgesic such as Tylenol for body aches fever and sore throat.  Rest drink plenty of fluids

## 2023-03-26 NOTE — Assessment & Plan Note (Signed)
Flu and COVID test in office.  Patient can do warm salt water gargles, throat lozenges, warm liquids with honey.  Patient can also use over-the-counter analgesics such as Tylenol

## 2023-03-26 NOTE — Assessment & Plan Note (Signed)
Likely contributing to patient's sore throat will do fluticasone nasal spray 50 mcg per actuation 2 sprays each nostril daily.

## 2023-03-30 ENCOUNTER — Ambulatory Visit: Payer: Medicare Other | Admitting: Hematology and Oncology

## 2023-04-14 ENCOUNTER — Encounter: Payer: Self-pay | Admitting: Nurse Practitioner

## 2023-04-15 NOTE — Telephone Encounter (Signed)
Pt made an appointment with Matt on 04/21/2023.

## 2023-04-17 ENCOUNTER — Other Ambulatory Visit: Payer: Self-pay | Admitting: Nurse Practitioner

## 2023-04-17 DIAGNOSIS — R0982 Postnasal drip: Secondary | ICD-10-CM

## 2023-04-21 ENCOUNTER — Ambulatory Visit: Payer: Medicare Other | Admitting: Nurse Practitioner

## 2023-04-22 ENCOUNTER — Other Ambulatory Visit (INDEPENDENT_AMBULATORY_CARE_PROVIDER_SITE_OTHER): Payer: Medicare Other

## 2023-04-22 DIAGNOSIS — E7800 Pure hypercholesterolemia, unspecified: Secondary | ICD-10-CM

## 2023-04-22 DIAGNOSIS — R7303 Prediabetes: Secondary | ICD-10-CM

## 2023-04-22 DIAGNOSIS — Z79899 Other long term (current) drug therapy: Secondary | ICD-10-CM

## 2023-04-22 DIAGNOSIS — E559 Vitamin D deficiency, unspecified: Secondary | ICD-10-CM

## 2023-04-22 LAB — COMPREHENSIVE METABOLIC PANEL
ALT: 24 U/L (ref 0–35)
AST: 25 U/L (ref 0–37)
Albumin: 4.4 g/dL (ref 3.5–5.2)
Alkaline Phosphatase: 30 U/L — ABNORMAL LOW (ref 39–117)
BUN: 16 mg/dL (ref 6–23)
CO2: 27 meq/L (ref 19–32)
Calcium: 9.2 mg/dL (ref 8.4–10.5)
Chloride: 103 meq/L (ref 96–112)
Creatinine, Ser: 1.1 mg/dL (ref 0.40–1.20)
GFR: 50.3 mL/min — ABNORMAL LOW (ref >60.00)
Glucose, Bld: 139 mg/dL — ABNORMAL HIGH (ref 70–99)
Potassium: 4.9 meq/L (ref 3.5–5.1)
Sodium: 139 meq/L (ref 135–145)
Total Bilirubin: 0.5 mg/dL (ref 0.2–1.2)
Total Protein: 6.5 g/dL (ref 6.0–8.3)

## 2023-04-22 LAB — CBC WITH DIFFERENTIAL/PLATELET
Basophils Absolute: 0 10*3/uL (ref 0.0–0.1)
Basophils Relative: 0.7 % (ref 0.0–3.0)
Eosinophils Absolute: 0.1 10*3/uL (ref 0.0–0.7)
Eosinophils Relative: 2 % (ref 0.0–5.0)
HCT: 45.5 % (ref 36.0–46.0)
Hemoglobin: 15.1 g/dL — ABNORMAL HIGH (ref 12.0–15.0)
Lymphocytes Relative: 61.9 % — ABNORMAL HIGH (ref 12.0–46.0)
Lymphs Abs: 3.4 10*3/uL (ref 0.7–4.0)
MCHC: 33.1 g/dL (ref 30.0–36.0)
MCV: 95.7 fL (ref 78.0–100.0)
Monocytes Absolute: 0.4 10*3/uL (ref 0.1–1.0)
Monocytes Relative: 6.3 % (ref 3.0–12.0)
Neutro Abs: 1.6 10*3/uL (ref 1.4–7.7)
Neutrophils Relative %: 29.1 % — ABNORMAL LOW (ref 43.0–77.0)
Platelets: 290 10*3/uL (ref 150.0–400.0)
RBC: 4.76 Mil/uL (ref 3.87–5.11)
RDW: 14.5 % (ref 11.5–15.5)
WBC: 5.6 10*3/uL (ref 4.0–10.5)

## 2023-04-22 LAB — LIPID PANEL
Cholesterol: 191 mg/dL (ref 0–200)
HDL: 70.1 mg/dL (ref >39.00)
LDL Cholesterol: 96 mg/dL (ref 0–99)
NonHDL: 120.65
Total CHOL/HDL Ratio: 3
Triglycerides: 123 mg/dL (ref 0.0–149.0)
VLDL: 24.6 mg/dL (ref 0.0–40.0)

## 2023-04-22 LAB — HEMOGLOBIN A1C: Hgb A1c MFr Bld: 6.2 % (ref 4.6–6.5)

## 2023-04-22 LAB — VITAMIN D 25 HYDROXY (VIT D DEFICIENCY, FRACTURES): VITD: 34.62 ng/mL (ref 30.00–100.00)

## 2023-04-22 NOTE — Progress Notes (Signed)
No critical labs need to be addressed urgently. We will discuss labs in detail at upcoming office visit.   

## 2023-04-29 ENCOUNTER — Encounter: Payer: Self-pay | Admitting: Family Medicine

## 2023-04-29 ENCOUNTER — Ambulatory Visit: Payer: Medicare Other | Admitting: Family Medicine

## 2023-04-29 VITALS — BP 110/74 | HR 90 | Temp 97.6°F | Ht 64.25 in | Wt 153.0 lb

## 2023-04-29 DIAGNOSIS — N289 Disorder of kidney and ureter, unspecified: Secondary | ICD-10-CM

## 2023-04-29 DIAGNOSIS — E2839 Other primary ovarian failure: Secondary | ICD-10-CM | POA: Diagnosis not present

## 2023-04-29 DIAGNOSIS — E7800 Pure hypercholesterolemia, unspecified: Secondary | ICD-10-CM

## 2023-04-29 DIAGNOSIS — R7303 Prediabetes: Secondary | ICD-10-CM

## 2023-04-29 DIAGNOSIS — D0511 Intraductal carcinoma in situ of right breast: Secondary | ICD-10-CM

## 2023-04-29 NOTE — Assessment & Plan Note (Signed)
Active treatment, followed by Dr. Pamelia Hoit oncology.  On tamoxifen 10 mg daily for the next 5 years. Discussed healthy lifestyle to mitigate side effects of this medication.

## 2023-04-29 NOTE — Progress Notes (Signed)
Patient ID: Rachael Sharp, female    DOB: 12-25-1950, 72 y.o.   MRN: [id number]  This visit was conducted in person.  BP 110/74 (BP Location: Right Arm, Patient Position: Sitting, Cuff Size: Normal)   Pulse 90   Temp 97.6 F (36.4 C) (Temporal)   Ht 5' 4.25" (1.632 m)   Wt 153 lb (69.4 kg)   SpO2 99%   BMI 26.06 kg/m    CC:  Chief Complaint  Patient presents with   Annual Exam    Part 2    Subjective:   HPI: Rachael Sharp is a 72 y.o. female presenting on 04/29/2023 for Annual Exam (Part 2)  The patient presents forreview of chronic health problems. He/She also has the following acute concerns today:  none   Reviewed labs in detail.   Cholesterol  Lab Results  Component Value Date   CHOL 191 04/22/2023   HDL 70.10 04/22/2023   LDLCALC 96 04/22/2023   LDLDIRECT 149.0 03/22/2013   TRIG 123.0 04/22/2023   CHOLHDL 3 04/22/2023   .The 10-year ASCVD risk score (Arnett DK, et al., 2019) is: 8.5%   Values used to calculate the score:     Age: 60 years     Sex: Female     Is Non-Hispanic African American: No     Diabetic: No     Tobacco smoker: No     Systolic Blood Pressure: 110 mmHg     Is BP treated: No     HDL Cholesterol: 70.1 mg/dL     Total Cholesterol: 191 mg/dL  Diet: heart healthy Exercise: recently restarted... usually 5 days a for 1.5 hoiurs Wt Readings from Last 3 Encounters:  04/29/23 153 lb (69.4 kg)  03/26/23 154 lb 3.2 oz (69.9 kg)  03/08/23 150 lb (68 kg)     Prediabetes   FBS elevated but A1C still in  prediabetes range... Recent trip to [place] and recently sick and not eating well, less activity while sick Lab Results  Component Value Date   HGBA1C 6.2 04/22/2023   GAD well controlled on sertraline 25 mg daily.   She is having some issues with sleep interruption..  Amitriptyline at night  Relevant past medical, surgical, family and social history reviewed and updated as indicated. Interim medical history since our last  visit reviewed. Allergies and medications reviewed and updated. Outpatient Medications Prior to Visit  Medication Sig Dispense Refill   amitriptyline (ELAVIL) 10 MG tablet TAKE 3 TABLETS BY MOUTH AT BEDTIME 270 tablet 3   aspirin 81 MG EC tablet Take 1 tablet by mouth daily.     BIOTIN PO Take 5,000 mcg by mouth daily.     CALCIUM PO Take 1 tablet by mouth in the morning and at bedtime.     Glucosamine HCl-MSM (GLUCOSAMINE-MSM PO) Take 1 tablet by mouth daily.     Multiple Vitamin (MULTIVITAMIN) tablet Take 2 tablets by mouth daily.      Omega-3 Fatty Acids (OMEGA 3 PO) Take 600 mg by mouth daily.      sertraline (ZOLOFT) 25 MG tablet TAKE 1 TABLET (25 MG TOTAL) BY MOUTH DAILY. 90 tablet 1   tamoxifen (NOLVADEX) 10 MG tablet Take 1 tablet (10 mg total) by mouth daily. 90 tablet 3   tretinoin (RETIN-A) 0.025 % cream Apply topically at bedtime.     benzonatate (TESSALON) 100 MG capsule Take 1 capsule (100 mg total) by mouth 3 (three) times daily as needed for cough. 21 capsule  0   fluticasone (FLONASE) 50 MCG/ACT nasal spray SPRAY 2 SPRAYS INTO EACH NOSTRIL EVERY DAY 48 mL 1   No facility-administered medications prior to visit.     Per HPI unless specifically indicated in ROS section below Review of Systems  Constitutional:  Negative for fatigue and fever.  HENT:  Negative for congestion.   Eyes:  Negative for pain.  Respiratory:  Negative for cough and shortness of breath.   Cardiovascular:  Negative for chest pain, palpitations and leg swelling.  Gastrointestinal:  Negative for abdominal pain.  Genitourinary:  Negative for dysuria and vaginal bleeding.  Musculoskeletal:  Negative for back pain.  Neurological:  Negative for syncope, light-headedness and headaches.  Psychiatric/Behavioral:  Negative for dysphoric mood.    Objective:  BP 110/74 (BP Location: Right Arm, Patient Position: Sitting, Cuff Size: Normal)   Pulse 90   Temp 97.6 F (36.4 C) (Temporal)   Ht 5' 4.25" (1.632  m)   Wt 153 lb (69.4 kg)   SpO2 99%   BMI 26.06 kg/m   Wt Readings from Last 3 Encounters:  04/29/23 153 lb (69.4 kg)  03/26/23 154 lb 3.2 oz (69.9 kg)  03/08/23 150 lb (68 kg)      Physical Exam Vitals and nursing note reviewed.  Constitutional:      General: She is not in acute distress.    Appearance: Normal appearance. She is well-developed. She is not ill-appearing or toxic-appearing.  HENT:     Head: Normocephalic.     Right Ear: Hearing, tympanic membrane, ear canal and external ear normal.     Left Ear: Hearing, tympanic membrane, ear canal and external ear normal.     Nose: Nose normal.  Eyes:     General: Lids are normal. Lids are everted, no foreign bodies appreciated.     Conjunctiva/sclera: Conjunctivae normal.     Pupils: Pupils are equal, round, and reactive to light.  Neck:     Thyroid: No thyroid mass or thyromegaly.     Vascular: No carotid bruit.     Trachea: Trachea normal.  Cardiovascular:     Rate and Rhythm: Normal rate and regular rhythm.     Heart sounds: Normal heart sounds, S1 normal and S2 normal. No murmur heard.    No gallop.  Pulmonary:     Effort: Pulmonary effort is normal. No respiratory distress.     Breath sounds: Normal breath sounds. No wheezing, rhonchi or rales.  Abdominal:     General: Bowel sounds are normal. There is no distension or abdominal bruit.     Palpations: Abdomen is soft. There is no fluid wave or mass.     Tenderness: There is no abdominal tenderness. There is no guarding or rebound.     Hernia: No hernia is present.  Musculoskeletal:     Cervical back: Normal range of motion and neck supple.  Lymphadenopathy:     Cervical: No cervical adenopathy.  Skin:    General: Skin is warm and dry.     Findings: No rash.  Neurological:     Mental Status: She is alert.     Cranial Nerves: No cranial nerve deficit.     Sensory: No sensory deficit.  Psychiatric:        Mood and Affect: Mood is not anxious or depressed.         Speech: Speech normal.        Behavior: Behavior normal. Behavior is cooperative.  Judgment: Judgment normal.       Results for orders placed or performed in visit on 04/22/23  Hemoglobin A1c   Collection Time: 04/22/23  8:03 AM  Result Value Ref Range   Hgb A1c MFr Bld 6.2 4.6 - 6.5 %  VITAMIN D 25 Hydroxy (Vit-D Deficiency, Fractures)   Collection Time: 04/22/23  8:03 AM  Result Value Ref Range   VITD 34.62 30.00 - 100.00 ng/mL  CBC with Differential/Platelet   Collection Time: 04/22/23  8:03 AM  Result Value Ref Range   WBC 5.6 4.0 - 10.5 K/uL   RBC 4.76 3.87 - 5.11 Mil/uL   Hemoglobin 15.1 (H) 12.0 - 15.0 g/dL   HCT 95.6 38.7 - 56.4 %   MCV 95.7 78.0 - 100.0 fl   MCHC 33.1 30.0 - 36.0 g/dL   RDW 33.2 95.1 - 88.4 %   Platelets 290.0 150.0 - 400.0 K/uL   Neutrophils Relative % 29.1 (L) 43.0 - 77.0 %   Lymphocytes Relative 61.9 Repeated and verified X2. (H) 12.0 - 46.0 %   Monocytes Relative 6.3 3.0 - 12.0 %   Eosinophils Relative 2.0 0.0 - 5.0 %   Basophils Relative 0.7 0.0 - 3.0 %   Neutro Abs 1.6 1.4 - 7.7 K/uL   Lymphs Abs 3.4 0.7 - 4.0 K/uL   Monocytes Absolute 0.4 0.1 - 1.0 K/uL   Eosinophils Absolute 0.1 0.0 - 0.7 K/uL   Basophils Absolute 0.0 0.0 - 0.1 K/uL  Lipid panel   Collection Time: 04/22/23  8:03 AM  Result Value Ref Range   Cholesterol 191 0 - 200 mg/dL   Triglycerides 166.0 0.0 - 149.0 mg/dL   HDL 63.01 >60.10 mg/dL   VLDL 93.2 0.0 - 35.5 mg/dL   LDL Cholesterol 96 0 - 99 mg/dL   Total CHOL/HDL Ratio 3    NonHDL 120.65   Comprehensive metabolic panel   Collection Time: 04/22/23  8:03 AM  Result Value Ref Range   Sodium 139 135 - 145 mEq/L   Potassium 4.9 3.5 - 5.1 mEq/L   Chloride 103 96 - 112 mEq/L   CO2 27 19 - 32 mEq/L   Glucose, Bld 139 (H) 70 - 99 mg/dL   BUN 16 6 - 23 mg/dL   Creatinine, Ser 7.32 0.40 - 1.20 mg/dL   Total Bilirubin 0.5 0.2 - 1.2 mg/dL   Alkaline Phosphatase 30 (L) 39 - 117 U/L   AST 25 0 - 37 U/L   ALT 24 0  - 35 U/L   Total Protein 6.5 6.0 - 8.3 g/dL   Albumin 4.4 3.5 - 5.2 g/dL   GFR 20.25 (L) >42.70 mL/min   Calcium 9.2 8.4 - 10.5 mg/dL     COVID 19 screen:  No recent travel or known exposure to COVID19 The patient denies respiratory symptoms of COVID 19 at this time. The importance of social distancing was discussed today.   Assessment and Plan   The patient's preventative maintenance and recommended screening tests for an annual wellness exam were reviewed in full today. Brought up to date unless services declined.  Counselled on the importance of diet, exercise, and its role in overall health and mortality. The patient's FH and SH was reviewed, including their home life, tobacco status, and drug and alcohol status.   Colonoscopy every 3 years. 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  Vaccines:uptodate, flu done, S/P COVID19 x3 and PNA series. S/P shingirx,   Flu  consider RSV Hep C: done  Mammogram: Chek 2 mutation. Annual mammogram with breast MRI Needs breast exam twice a year.. 02/2023  Bone density: 02/2018 normal, repeat in 5 years.  Problem List Items Addressed This Visit     Ductal carcinoma in situ (DCIS) of right breast   Active treatment, followed by Dr. Pamelia Hoit oncology.  On tamoxifen 10 mg daily for the next 5 years. Discussed healthy lifestyle to mitigate side effects of this medication.      Prediabetes - Primary    Recent worsening after trip and illness... will get back on track and recheck in 3 months. Encouraged exercise, weight loss, healthy eating habits.       Relevant Orders   Hemoglobin A1c   Renal insufficiency    Chronic , slight decrease.. increase hydration, avoid NSAIDs... re-eval in 3 months.      Relevant Orders   Basic Metabolic Panel   Other Visit Diagnoses       High cholesterol       Relevant Orders   Lipid panel     Estrogen deficiency       Relevant Orders   DG Bone Density        Kerby Nora, MD

## 2023-04-29 NOTE — Assessment & Plan Note (Signed)
Chronic , slight decrease.. increase hydration, avoid NSAIDs... re-eval in 3 months.

## 2023-04-29 NOTE — Assessment & Plan Note (Addendum)
Recent worsening after trip and illness... will get back on track and recheck in 3 months. Encouraged exercise, weight loss, healthy eating habits.

## 2023-04-29 NOTE — Patient Instructions (Addendum)
Please call the location of your choice from the menu below to schedule your Mammogram and/or Bone Density appointment.    [facility]                      Phone:  [phone]                               [place], [place] [address]                                                             Services: Traditional and 3D Mammogram, Bone Density

## 2023-05-05 ENCOUNTER — Other Ambulatory Visit: Payer: Self-pay | Admitting: Family Medicine

## 2023-05-14 ENCOUNTER — Encounter: Payer: Self-pay | Admitting: Plastic Surgery

## 2023-05-14 ENCOUNTER — Ambulatory Visit (INDEPENDENT_AMBULATORY_CARE_PROVIDER_SITE_OTHER): Payer: Self-pay | Admitting: Plastic Surgery

## 2023-05-14 DIAGNOSIS — Z719 Counseling, unspecified: Secondary | ICD-10-CM | POA: Insufficient documentation

## 2023-05-14 NOTE — Progress Notes (Addendum)
 Preoperative Dx: Hyperpigmentation of face  Postoperative Dx:  same  Procedure: laser to face  Anesthesia: none  Description of Procedure:  Risks and complications were explained to the patient. Consent was confirmed and signed. Eye protection was placed. Time out was called and all information was confirmed to be correct. The area  area was prepped with alcohol and wiped dry. The heroic laser was set at 532 nm presettings. Additional shots at the nose and cheeks for vessels. The face was lasered. The patient tolerated the procedure well and there were no complications. The patient is to follow up in 4 weeks.

## 2023-05-17 ENCOUNTER — Other Ambulatory Visit: Payer: Self-pay | Admitting: Hematology and Oncology

## 2023-05-26 ENCOUNTER — Ambulatory Visit: Payer: Medicare Other | Admitting: Family Medicine

## 2023-05-26 ENCOUNTER — Encounter: Payer: Self-pay | Admitting: Family Medicine

## 2023-05-26 VITALS — BP 108/68 | HR 78 | Temp 98.1°F | Ht 64.25 in | Wt 157.2 lb

## 2023-05-26 DIAGNOSIS — Z1509 Genetic susceptibility to other malignant neoplasm: Secondary | ICD-10-CM | POA: Diagnosis not present

## 2023-05-26 DIAGNOSIS — Z1501 Genetic susceptibility to malignant neoplasm of breast: Secondary | ICD-10-CM | POA: Diagnosis not present

## 2023-05-26 DIAGNOSIS — F5104 Psychophysiologic insomnia: Secondary | ICD-10-CM

## 2023-05-26 DIAGNOSIS — Z1589 Genetic susceptibility to other disease: Secondary | ICD-10-CM | POA: Diagnosis not present

## 2023-05-26 DIAGNOSIS — D0511 Intraductal carcinoma in situ of right breast: Secondary | ICD-10-CM | POA: Diagnosis not present

## 2023-05-26 DIAGNOSIS — Z1502 Genetic susceptibility to malignant neoplasm of ovary: Secondary | ICD-10-CM

## 2023-05-26 NOTE — Progress Notes (Signed)
 Patient ID: Rachael Sharp, female    DOB: May 22, 1950, 73 y.o.   MRN: [id number]  This visit was conducted in person.  BP 108/68   Pulse 78   Temp 98.1 F (36.7 C) (Oral)   Ht 5' 4.25" (1.632 m)   Wt 157 lb 3.2 oz (71.3 kg)   SpO2 93%   BMI 26.77 kg/m    CC:  Chief Complaint  Patient presents with   Breast Exam   Insomnia    Patient is having issues staying asleep     Subjective:   HPI: Rachael Sharp is a 73 y.o. female presenting on 05/26/2023 for Breast Exam and Insomnia (Patient is having issues staying asleep )   Presents for  breast exam given CHEK 2 mutation. 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  Current active treatment with tamoxifen  10 mg daily for 5 years. Followed by Dr. Gudena, oncology  She also reports  she has been having issues with  staying asleep in last   She has started melatonin at night.. minimal help.  Using amitriptyline  30 mg , sertraline  25 mg daily... mood overall well controlled, no anxiety.  Doing daily.  Good sleep hygiene, no naps.    Relevant past medical, surgical, family and social history reviewed and updated as indicated. Interim medical history since our last visit reviewed. Allergies and medications reviewed and updated. Outpatient Medications Prior to Visit  Medication Sig Dispense Refill   amitriptyline  (ELAVIL ) 10 MG tablet TAKE 3 TABLETS BY MOUTH AT BEDTIME 270 tablet 3   aspirin 81 MG EC tablet Take 1 tablet by mouth daily.     BIOTIN PO Take 5,000 mcg by mouth daily.     CALCIUM PO Take 1 tablet by mouth in the morning and at bedtime.     Glucosamine HCl-MSM (GLUCOSAMINE-MSM PO) Take 1 tablet by mouth daily.     Melatonin 5 MG CAPS Take 5 mg by mouth daily.     Multiple Vitamin (MULTIVITAMIN) tablet Take 2 tablets by mouth daily.      Omega-3 Fatty Acids (OMEGA 3 PO) Take 600 mg by mouth daily.      sertraline  (ZOLOFT ) 25 MG tablet TAKE 1 TABLET (25 MG  TOTAL) BY MOUTH DAILY. 90 tablet 1   tamoxifen  (NOLVADEX ) 10 MG tablet TAKE 1 TABLET BY MOUTH EVERY DAY 90 tablet 3   tretinoin (RETIN-A) 0.025 % cream Apply topically at bedtime.     No facility-administered medications prior to visit.     Per HPI unless specifically indicated in ROS section below Review of Systems  Constitutional:  Negative for fatigue and fever.  HENT:  Negative for congestion.   Eyes:  Negative for pain.  Respiratory:  Negative for cough and shortness of breath.   Cardiovascular:  Negative for chest pain, palpitations and leg swelling.  Gastrointestinal:  Negative for abdominal pain.  Genitourinary:  Negative for dysuria and vaginal bleeding.  Musculoskeletal:  Negative for back pain.  Neurological:  Negative for syncope, light-headedness and headaches.  Psychiatric/Behavioral:  Positive for sleep disturbance. Negative for dysphoric mood.    Objective:  BP 108/68   Pulse 78   Temp 98.1 F (36.7 C) (Oral)   Ht 5' 4.25" (1.632 m)   Wt 157 lb 3.2 oz (71.3 kg)   SpO2 93%   BMI 26.77 kg/m   Wt Readings from Last 3 Encounters:  05/26/23 157 lb 3.2 oz (71.3 kg)  04/29/23  153 lb (69.4 kg)  03/26/23 154 lb 3.2 oz (69.9 kg)      Physical Exam Constitutional:      General: She is not in acute distress.    Appearance: Normal appearance. She is well-developed. She is not ill-appearing or toxic-appearing.  HENT:     Head: Normocephalic.     Right Ear: Hearing normal. Tympanic membrane is not erythematous, retracted or bulging.     Left Ear: Hearing normal. Tympanic membrane is not erythematous, retracted or bulging.     Nose: No mucosal edema.     Right Sinus: No maxillary sinus tenderness or frontal sinus tenderness.     Left Sinus: No maxillary sinus tenderness or frontal sinus tenderness.     Mouth/Throat:     Pharynx: Uvula midline.  Eyes:     General: Lids are normal. Lids are everted, no foreign bodies appreciated.     Conjunctiva/sclera: Conjunctivae  normal.  Neck:     Thyroid : No thyroid  mass or thyromegaly.     Trachea: Trachea normal.  Cardiovascular:     Heart sounds: S1 normal and S2 normal.  Pulmonary:     Effort: Pulmonary effort is normal. No tachypnea.     Breath sounds: No decreased breath sounds.  Chest:     Chest wall: No mass, lacerations, deformity, swelling, tenderness or edema.  Breasts:    Breasts are symmetrical.     Right: Normal. No swelling, bleeding, inverted nipple, mass, nipple discharge, skin change or tenderness.     Left: Normal. No swelling, bleeding, inverted nipple, mass, nipple discharge, skin change or tenderness.       Comments: Well healed surgical site Lymphadenopathy:     Upper Body:     Right upper body: No supraclavicular, axillary or pectoral adenopathy.     Left upper body: No supraclavicular, axillary or pectoral adenopathy.  Neurological:     Mental Status: She is alert.  Psychiatric:        Mood and Affect: Mood is not anxious or depressed.        Speech: Speech normal.        Behavior: Behavior is cooperative.       Results for orders placed or performed in visit on 04/22/23  Hemoglobin A1c   Collection Time: 04/22/23  8:03 AM  Result Value Ref Range   Hgb A1c MFr Bld 6.2 4.6 - 6.5 %  VITAMIN D  25 Hydroxy (Vit-D Deficiency, Fractures)   Collection Time: 04/22/23  8:03 AM  Result Value Ref Range   VITD 34.62 30.00 - 100.00 ng/mL  CBC with Differential/Platelet   Collection Time: 04/22/23  8:03 AM  Result Value Ref Range   WBC 5.6 4.0 - 10.5 K/uL   RBC 4.76 3.87 - 5.11 Mil/uL   Hemoglobin 15.1 (H) 12.0 - 15.0 g/dL   HCT 59.5 63.8 - 75.6 %   MCV 95.7 78.0 - 100.0 fl   MCHC 33.1 30.0 - 36.0 g/dL   RDW 43.3 29.5 - 18.8 %   Platelets 290.0 150.0 - 400.0 K/uL   Neutrophils Relative % 29.1 (L) 43.0 - 77.0 %   Lymphocytes Relative 61.9 Repeated and verified X2. (H) 12.0 - 46.0 %   Monocytes Relative 6.3 3.0 - 12.0 %   Eosinophils Relative 2.0 0.0 - 5.0 %   Basophils  Relative 0.7 0.0 - 3.0 %   Neutro Abs 1.6 1.4 - 7.7 K/uL   Lymphs Abs 3.4 0.7 - 4.0 K/uL   Monocytes Absolute 0.4 0.1 -  1.0 K/uL   Eosinophils Absolute 0.1 0.0 - 0.7 K/uL   Basophils Absolute 0.0 0.0 - 0.1 K/uL  Lipid panel   Collection Time: 04/22/23  8:03 AM  Result Value Ref Range   Cholesterol 191 0 - 200 mg/dL   Triglycerides 161.0 0.0 - 149.0 mg/dL   HDL 96.04 >54.09 mg/dL   VLDL 81.1 0.0 - 91.4 mg/dL   LDL Cholesterol 96 0 - 99 mg/dL   Total CHOL/HDL Ratio 3    NonHDL 120.65   Comprehensive metabolic panel   Collection Time: 04/22/23  8:03 AM  Result Value Ref Range   Sodium 139 135 - 145 mEq/L   Potassium 4.9 3.5 - 5.1 mEq/L   Chloride 103 96 - 112 mEq/L   CO2 27 19 - 32 mEq/L   Glucose, Bld 139 (H) 70 - 99 mg/dL   BUN 16 6 - 23 mg/dL   Creatinine, Ser 7.82 0.40 - 1.20 mg/dL   Total Bilirubin 0.5 0.2 - 1.2 mg/dL   Alkaline Phosphatase 30 (L) 39 - 117 U/L   AST 25 0 - 37 U/L   ALT 24 0 - 35 U/L   Total Protein 6.5 6.0 - 8.3 g/dL   Albumin 4.4 3.5 - 5.2 g/dL   GFR 95.62 (L) >13.08 mL/min   Calcium 9.2 8.4 - 10.5 mg/dL    Assessment and Plan  Monoallelic mutation of CHEK2 gene in female patient Assessment & Plan: This is a pathogenic mutation that has been associated with breast cancer, colon, thyroid  and kidney cancers.  Patient is followed by MRI, mammogram and oncology, Dr. Lee Public.  Breast exam performed today with no abnormalities.   Ductal carcinoma in situ (DCIS) of right breast Assessment & Plan: Active treatment, followed by Dr. Lee Public oncology.  On tamoxifen  10 mg daily for the next 5 years. Discussed healthy lifestyle to mitigate side effects of this medication.   Chronic insomnia Assessment & Plan: Chronic, no issue falling asleep, some issues staying asleep at night and early morning waking.  She reports good control of anxiety and depression on sertraline  25 mg daily. 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  She  does not want to increase this at this time. She will try higher dose of melatonin at 10 mg p.o. nightly. Continue healthy sleep habits/hygiene. Follow-up as needed     No follow-ups on file.   Herby Lolling, MD

## 2023-05-26 NOTE — Assessment & Plan Note (Signed)
Active treatment, followed by Dr. Pamelia Hoit oncology.  On tamoxifen 10 mg daily for the next 5 years. Discussed healthy lifestyle to mitigate side effects of this medication.

## 2023-05-26 NOTE — Assessment & Plan Note (Signed)
This is a pathogenic mutation that has been associated with breast cancer, colon, thyroid and kidney cancers.  Patient is followed by MRI, mammogram and oncology, Dr. Pamelia Hoit.  Breast exam performed today with no abnormalities.

## 2023-05-26 NOTE — Assessment & Plan Note (Signed)
 Chronic, no issue falling asleep, some issues staying asleep at night and early morning waking.  She reports good control of anxiety and depression on sertraline  25 mg daily. 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  She does not want to increase this at this time. She will try higher dose of melatonin at 10 mg p.o. nightly. Continue healthy sleep habits/hygiene. Follow-up as needed

## 2023-06-09 ENCOUNTER — Telehealth: Payer: Self-pay

## 2023-06-09 NOTE — Telephone Encounter (Signed)
Copied from CRM [id number]. Topic: Clinical - Medical Advice >> Jun 09, 2023 10:21 AM Rachael Sharp wrote: Reason for CRM: Patient called to ask that her medical record be updated to indicate she received the most recent COVID booster in September 2024

## 2023-06-09 NOTE — Telephone Encounter (Signed)
I called and left patient a message that immunization record is updated.

## 2023-06-15 ENCOUNTER — Other Ambulatory Visit (HOSPITAL_COMMUNITY): Payer: Self-pay

## 2023-06-15 ENCOUNTER — Ambulatory Visit: Payer: Self-pay | Admitting: Plastic Surgery

## 2023-06-15 DIAGNOSIS — Z719 Counseling, unspecified: Secondary | ICD-10-CM

## 2023-06-15 MED ORDER — LIDOCAINE 23% - TETRACAINE 7% TOPICAL OINTMENT (PLASTICIZED)
1.0000 | TOPICAL_OINTMENT | Freq: Once | CUTANEOUS | 0 refills | Status: AC
  Filled 2023-06-15: qty 60, 1d supply, fill #0

## 2023-06-15 NOTE — Progress Notes (Signed)
Preoperative Dx: hyperpigmentation of face  Postoperative Dx:  same  Procedure: laser to face   Anesthesia: none  Description of Procedure:  Risks and complications were explained to the patient. Consent was confirmed and signed. Eye protection was placed. Time out was called and all information was confirmed to be correct. The area  area was prepped with alcohol and wiped dry. The Heroic laser was set at 532 nm and preset J/cm2. Then 515 nm was utilized in spots.The face was lasered. The patient tolerated the procedure well and there were no complications. The patient is to follow up in 4 weeks.

## 2023-06-16 ENCOUNTER — Other Ambulatory Visit (HOSPITAL_COMMUNITY): Payer: Self-pay

## 2023-07-13 ENCOUNTER — Ambulatory Visit (INDEPENDENT_AMBULATORY_CARE_PROVIDER_SITE_OTHER): Payer: Self-pay | Admitting: Plastic Surgery

## 2023-07-13 DIAGNOSIS — Z719 Counseling, unspecified: Secondary | ICD-10-CM

## 2023-07-13 NOTE — Progress Notes (Signed)
 Preoperative Dx: hyperpigmentation of face  Postoperative Dx:  same  Procedure: laser to face   Anesthesia: none  Description of Procedure:  Risks and complications were explained to the patient. Consent was confirmed and signed. Eye protection was placed. Time out was called and all information was confirmed to be correct. The area  area was prepped with alcohol and wiped dry. The Heroic laser was set at 532 nm at preset J/cm2. The face was lasered. The patient tolerated the procedure well and there were no complications. The patient is to follow up in 4 weeks.

## 2023-07-14 ENCOUNTER — Encounter: Payer: Self-pay | Admitting: Plastic Surgery

## 2023-07-14 ENCOUNTER — Telehealth: Payer: Self-pay

## 2023-07-14 NOTE — Telephone Encounter (Signed)
 I called the patient regarding her message about swelling under her eye one day post laser treatment. She was not concerned with the swelling, but just wanted to make [place] aware. Patient stated she took some Advil and has been applying ice. I told her that is the correct thing to do. I offered for her to be seen today if she wanted a PA to look at it and she did not want to come in today. I told her to continue to monitor the swelling and to call us tomorrow or send [place] a MyChart message if the swelling has not gone away.

## 2023-07-23 ENCOUNTER — Other Ambulatory Visit: Payer: Self-pay | Admitting: Family Medicine

## 2023-07-23 ENCOUNTER — Encounter: Payer: Self-pay | Admitting: Family Medicine

## 2023-07-23 DIAGNOSIS — R5383 Other fatigue: Secondary | ICD-10-CM

## 2023-07-28 ENCOUNTER — Encounter: Payer: Self-pay | Admitting: Family Medicine

## 2023-07-28 ENCOUNTER — Other Ambulatory Visit (INDEPENDENT_AMBULATORY_CARE_PROVIDER_SITE_OTHER): Payer: Medicare Other

## 2023-07-28 DIAGNOSIS — E7800 Pure hypercholesterolemia, unspecified: Secondary | ICD-10-CM

## 2023-07-28 DIAGNOSIS — R7303 Prediabetes: Secondary | ICD-10-CM

## 2023-07-28 DIAGNOSIS — N289 Disorder of kidney and ureter, unspecified: Secondary | ICD-10-CM | POA: Diagnosis not present

## 2023-07-28 LAB — HEMOGLOBIN A1C: Hgb A1c MFr Bld: 6.2 % (ref 4.6–6.5)

## 2023-07-28 LAB — BASIC METABOLIC PANEL
BUN: 18 mg/dL (ref 6–23)
CO2: 28 meq/L (ref 19–32)
Calcium: 9.5 mg/dL (ref 8.4–10.5)
Chloride: 104 meq/L (ref 96–112)
Creatinine, Ser: 1.06 mg/dL (ref 0.40–1.20)
GFR: 52.49 mL/min — ABNORMAL LOW (ref >60.00)
Glucose, Bld: 116 mg/dL — ABNORMAL HIGH (ref 70–99)
Potassium: 4.8 meq/L (ref 3.5–5.1)
Sodium: 138 meq/L (ref 135–145)

## 2023-07-28 LAB — LIPID PANEL
Cholesterol: 157 mg/dL (ref 0–200)
HDL: 65.6 mg/dL (ref >39.00)
LDL Cholesterol: 73 mg/dL (ref 0–99)
NonHDL: 91.87
Total CHOL/HDL Ratio: 2
Triglycerides: 95 mg/dL (ref 0.0–149.0)
VLDL: 19 mg/dL (ref 0.0–40.0)

## 2023-08-12 ENCOUNTER — Ambulatory Visit
Admission: RE | Admit: 2023-08-12 | Discharge: 2023-08-12 | Disposition: A | Discharge status: Home / Self Care | Payer: Medicare Other | Source: Ambulatory Visit | Attending: Hematology and Oncology | Admitting: Hematology and Oncology

## 2023-08-12 DIAGNOSIS — Z1239 Encounter for other screening for malignant neoplasm of breast: Secondary | ICD-10-CM | POA: Diagnosis not present

## 2023-08-12 DIAGNOSIS — D0511 Intraductal carcinoma in situ of right breast: Secondary | ICD-10-CM

## 2023-08-12 DIAGNOSIS — Z803 Family history of malignant neoplasm of breast: Secondary | ICD-10-CM | POA: Diagnosis not present

## 2023-08-12 MED ORDER — GADOPICLENOL 0.5 MMOL/ML IV SOLN
7.0000 mL | Freq: Once | INTRAVENOUS | Status: AC | PRN
  Administered 2023-08-12: 7 mL via INTRAVENOUS

## 2023-08-13 ENCOUNTER — Telehealth: Payer: Self-pay | Admitting: *Deleted

## 2023-08-13 NOTE — Telephone Encounter (Signed)
-----   Message from Tamsen Meek sent at 08/12/2023  2:51 PM EDT ----- Please let her know that the breast MRI is normal. v ----- Message ----- From: Interface, Rad Results In Sent: 08/12/2023  11:53 AM EDT To: Serena Croissant, MD

## 2023-08-13 NOTE — Telephone Encounter (Signed)
RN attempt x1 to contact pt with below information.  No answer, LVM for pt to return call to the office.

## 2023-09-01 ENCOUNTER — Other Ambulatory Visit: Payer: Self-pay | Admitting: Family Medicine

## 2023-09-01 NOTE — Telephone Encounter (Signed)
 Last office visit 05/26/2023 for Breast Exam and Insomnia.  Last refilled 10/06/2022 for #[id number] with 3 refills.  Next Appt: CPE 05/02/2024

## 2023-10-19 ENCOUNTER — Other Ambulatory Visit: Payer: Self-pay | Admitting: Family Medicine

## 2023-11-15 DIAGNOSIS — M955 Acquired deformity of pelvis: Secondary | ICD-10-CM | POA: Diagnosis not present

## 2023-11-15 DIAGNOSIS — M9905 Segmental and somatic dysfunction of pelvic region: Secondary | ICD-10-CM | POA: Diagnosis not present

## 2023-11-15 DIAGNOSIS — M5136 Other intervertebral disc degeneration, lumbar region with discogenic back pain only: Secondary | ICD-10-CM | POA: Diagnosis not present

## 2023-11-15 DIAGNOSIS — M9903 Segmental and somatic dysfunction of lumbar region: Secondary | ICD-10-CM | POA: Diagnosis not present

## 2023-11-18 DIAGNOSIS — M9903 Segmental and somatic dysfunction of lumbar region: Secondary | ICD-10-CM | POA: Diagnosis not present

## 2023-11-18 DIAGNOSIS — M955 Acquired deformity of pelvis: Secondary | ICD-10-CM | POA: Diagnosis not present

## 2023-11-18 DIAGNOSIS — M9905 Segmental and somatic dysfunction of pelvic region: Secondary | ICD-10-CM | POA: Diagnosis not present

## 2023-11-18 DIAGNOSIS — M5136 Other intervertebral disc degeneration, lumbar region with discogenic back pain only: Secondary | ICD-10-CM | POA: Diagnosis not present

## 2023-11-22 DIAGNOSIS — M5136 Other intervertebral disc degeneration, lumbar region with discogenic back pain only: Secondary | ICD-10-CM | POA: Diagnosis not present

## 2023-11-22 DIAGNOSIS — M9903 Segmental and somatic dysfunction of lumbar region: Secondary | ICD-10-CM | POA: Diagnosis not present

## 2023-11-22 DIAGNOSIS — M9905 Segmental and somatic dysfunction of pelvic region: Secondary | ICD-10-CM | POA: Diagnosis not present

## 2023-11-22 DIAGNOSIS — M955 Acquired deformity of pelvis: Secondary | ICD-10-CM | POA: Diagnosis not present

## 2023-11-25 ENCOUNTER — Encounter: Payer: Self-pay | Admitting: Family Medicine

## 2023-11-25 ENCOUNTER — Ambulatory Visit (INDEPENDENT_AMBULATORY_CARE_PROVIDER_SITE_OTHER): Admitting: Family Medicine

## 2023-11-25 VITALS — BP 100/72 | HR 87 | Temp 98.2°F | Ht 64.25 in | Wt 154.4 lb

## 2023-11-25 DIAGNOSIS — Z1501 Genetic susceptibility to malignant neoplasm of breast: Secondary | ICD-10-CM

## 2023-11-25 DIAGNOSIS — M9903 Segmental and somatic dysfunction of lumbar region: Secondary | ICD-10-CM | POA: Diagnosis not present

## 2023-11-25 DIAGNOSIS — Z1502 Genetic susceptibility to malignant neoplasm of ovary: Secondary | ICD-10-CM

## 2023-11-25 DIAGNOSIS — Z1509 Genetic susceptibility to other malignant neoplasm: Secondary | ICD-10-CM | POA: Diagnosis not present

## 2023-11-25 DIAGNOSIS — M9905 Segmental and somatic dysfunction of pelvic region: Secondary | ICD-10-CM | POA: Diagnosis not present

## 2023-11-25 DIAGNOSIS — Z1589 Genetic susceptibility to other disease: Secondary | ICD-10-CM

## 2023-11-25 DIAGNOSIS — M5136 Other intervertebral disc degeneration, lumbar region with discogenic back pain only: Secondary | ICD-10-CM | POA: Diagnosis not present

## 2023-11-25 DIAGNOSIS — M955 Acquired deformity of pelvis: Secondary | ICD-10-CM | POA: Diagnosis not present

## 2023-11-25 DIAGNOSIS — D0511 Intraductal carcinoma in situ of right breast: Secondary | ICD-10-CM | POA: Diagnosis not present

## 2023-11-25 NOTE — Assessment & Plan Note (Signed)
This is a pathogenic mutation that has been associated with breast cancer, colon, thyroid and kidney cancers.  Patient is followed by MRI, mammogram and oncology, Dr. Pamelia Hoit.  Breast exam performed today with no abnormalities.

## 2023-11-25 NOTE — Assessment & Plan Note (Signed)
 Active treatment, followed by Dr. Odean oncology.  On tamoxifen  10 mg daily for  5 years. Discussed healthy lifestyle to mitigate side effects of this medication.

## 2023-11-25 NOTE — Progress Notes (Signed)
 Patient ID: Rachael Sharp, female    DOB: 25-Feb-1951, 73 y.o.   MRN: [id number]  This visit was conducted in person.  BP 100/72   Pulse 87   Temp 98.2 F (36.8 C) (Temporal)   Ht 5' 4.25 (1.632 m)   Wt 154 lb 6 oz (70 kg)   SpO2 99%   BMI 26.29 kg/m    CC:  Chief Complaint  Patient presents with   Breast Exam    Subjective:   HPI: Rachael Sharp is a 73 y.o. female presenting on 11/25/2023 for Breast Exam   Presents for  breast exam given CHEK 2 mutation. 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  Current active treatment with tamoxifen  10 mg daily for 5 years. Followed by Dr. Odean, oncology.. next OV in 1 month.   08/2023 mammogram   MRI planned in 02/2024  Relevant past medical, surgical, family and social history reviewed and updated as indicated. Interim medical history since our last visit reviewed. Allergies and medications reviewed and updated. Outpatient Medications Prior to Visit  Medication Sig Dispense Refill   amitriptyline  (ELAVIL ) 10 MG tablet TAKE 3 TABLETS BY MOUTH AT BEDTIME 270 tablet 3   aspirin 81 MG EC tablet Take 1 tablet by mouth daily.     BIOTIN PO Take 5,000 mcg by mouth daily.     CALCIUM PO Take 1 tablet by mouth in the morning and at bedtime.     Glucosamine HCl-MSM (GLUCOSAMINE-MSM PO) Take 1 tablet by mouth daily.     Multiple Vitamin (MULTIVITAMIN) tablet Take 2 tablets by mouth daily.      Omega-3 Fatty Acids (OMEGA 3 PO) Take 600 mg by mouth daily.      sertraline  (ZOLOFT ) 25 MG tablet TAKE 1 TABLET (25 MG TOTAL) BY MOUTH DAILY. 90 tablet 1   tamoxifen  (NOLVADEX ) 10 MG tablet TAKE 1 TABLET BY MOUTH EVERY DAY 90 tablet 3   tretinoin (RETIN-A) 0.025 % cream Apply topically at bedtime.     Melatonin 5 MG CAPS Take 5 mg by mouth daily.     No facility-administered medications prior to visit.     Per HPI unless specifically indicated in ROS section below Review of Systems   Constitutional:  Negative for fatigue and fever.  HENT:  Negative for congestion.   Eyes:  Negative for pain.  Respiratory:  Negative for cough and shortness of breath.   Cardiovascular:  Negative for chest pain, palpitations and leg swelling.  Gastrointestinal:  Negative for abdominal pain.  Genitourinary:  Negative for dysuria and vaginal bleeding.  Musculoskeletal:  Negative for back pain.  Neurological:  Negative for syncope, light-headedness and headaches.  Psychiatric/Behavioral:  Positive for sleep disturbance. Negative for dysphoric mood.    Objective:  BP 100/72   Pulse 87   Temp 98.2 F (36.8 C) (Temporal)   Ht 5' 4.25 (1.632 m)   Wt 154 lb 6 oz (70 kg)   SpO2 99%   BMI 26.29 kg/m   Wt Readings from Last 3 Encounters:  11/25/23 154 lb 6 oz (70 kg)  05/26/23 157 lb 3.2 oz (71.3 kg)  04/29/23 153 lb (69.4 kg)      Physical Exam Constitutional:      General: She is not in acute distress.    Appearance: Normal appearance. She is well-developed. She is not ill-appearing or toxic-appearing.  HENT:     Head: Normocephalic.     Right  Ear: Hearing normal. Tympanic membrane is not erythematous, retracted or bulging.     Left Ear: Hearing normal. Tympanic membrane is not erythematous, retracted or bulging.     Nose: No mucosal edema.     Right Sinus: No maxillary sinus tenderness or frontal sinus tenderness.     Left Sinus: No maxillary sinus tenderness or frontal sinus tenderness.     Mouth/Throat:     Pharynx: Uvula midline.  Eyes:     General: Lids are normal. Lids are everted, no foreign bodies appreciated.     Conjunctiva/sclera: Conjunctivae normal.  Neck:     Thyroid : No thyroid  mass or thyromegaly.     Trachea: Trachea normal.  Cardiovascular:     Heart sounds: S1 normal and S2 normal.  Pulmonary:     Effort: Pulmonary effort is normal. No tachypnea.     Breath sounds: No decreased breath sounds.  Chest:     Chest wall: No mass, lacerations, deformity,  swelling, tenderness or edema.  Breasts:    Breasts are symmetrical.     Right: Normal. No swelling, bleeding, inverted nipple, mass, nipple discharge, skin change or tenderness.     Left: Normal. No swelling, bleeding, inverted nipple, mass, nipple discharge, skin change or tenderness.       Comments: Well healed surgical site Lymphadenopathy:     Upper Body:     Right upper body: No supraclavicular, axillary or pectoral adenopathy.     Left upper body: No supraclavicular, axillary or pectoral adenopathy.  Neurological:     Mental Status: She is alert.  Psychiatric:        Mood and Affect: Mood is not anxious or depressed.        Speech: Speech normal.        Behavior: Behavior is cooperative.       Results for orders placed or performed in visit on 07/28/23  Hemoglobin A1c   Collection Time: 07/28/23  8:19 AM  Result Value Ref Range   Hgb A1c MFr Bld 6.2 4.6 - 6.5 %  Basic Metabolic Panel   Collection Time: 07/28/23  8:19 AM  Result Value Ref Range   Sodium 138 135 - 145 mEq/L   Potassium 4.8 3.5 - 5.1 mEq/L   Chloride 104 96 - 112 mEq/L   CO2 28 19 - 32 mEq/L   Glucose, Bld 116 (H) 70 - 99 mg/dL   BUN 18 6 - 23 mg/dL   Creatinine, Ser 8.93 0.40 - 1.20 mg/dL   GFR 47.50 (L) >39.99 mL/min   Calcium 9.5 8.4 - 10.5 mg/dL  Lipid panel   Collection Time: 07/28/23  8:19 AM  Result Value Ref Range   Cholesterol 157 0 - 200 mg/dL   Triglycerides 04.9 0.0 - 149.0 mg/dL   HDL 34.39 >60.99 mg/dL   VLDL 80.9 0.0 - 59.9 mg/dL   LDL Cholesterol 73 0 - 99 mg/dL   Total CHOL/HDL Ratio 2    NonHDL 91.87     Assessment and Plan  Monoallelic mutation of CHEK2 gene in female patient Assessment & Plan: This is a pathogenic mutation that has been associated with breast cancer, colon, thyroid  and kidney cancers.  Patient is followed by MRI, mammogram and oncology, Dr. Odean.  Breast exam performed today with no abnormalities.   Ductal carcinoma in situ (DCIS) of right  breast Assessment & Plan: Active treatment, followed by Dr. Odean oncology.  On tamoxifen  10 mg daily for  5 years. Discussed healthy lifestyle to mitigate  side effects of this medication.      No follow-ups on file.   Greig Ring, MD

## 2023-11-29 DIAGNOSIS — H35372 Puckering of macula, left eye: Secondary | ICD-10-CM | POA: Diagnosis not present

## 2023-11-29 DIAGNOSIS — H2513 Age-related nuclear cataract, bilateral: Secondary | ICD-10-CM | POA: Diagnosis not present

## 2023-11-29 DIAGNOSIS — H43813 Vitreous degeneration, bilateral: Secondary | ICD-10-CM | POA: Diagnosis not present

## 2023-12-02 DIAGNOSIS — M5136 Other intervertebral disc degeneration, lumbar region with discogenic back pain only: Secondary | ICD-10-CM | POA: Diagnosis not present

## 2023-12-02 DIAGNOSIS — M955 Acquired deformity of pelvis: Secondary | ICD-10-CM | POA: Diagnosis not present

## 2023-12-02 DIAGNOSIS — M9903 Segmental and somatic dysfunction of lumbar region: Secondary | ICD-10-CM | POA: Diagnosis not present

## 2023-12-02 DIAGNOSIS — M9905 Segmental and somatic dysfunction of pelvic region: Secondary | ICD-10-CM | POA: Diagnosis not present

## 2023-12-06 DIAGNOSIS — M9905 Segmental and somatic dysfunction of pelvic region: Secondary | ICD-10-CM | POA: Diagnosis not present

## 2023-12-06 DIAGNOSIS — M955 Acquired deformity of pelvis: Secondary | ICD-10-CM | POA: Diagnosis not present

## 2023-12-06 DIAGNOSIS — M9903 Segmental and somatic dysfunction of lumbar region: Secondary | ICD-10-CM | POA: Diagnosis not present

## 2023-12-06 DIAGNOSIS — M5136 Other intervertebral disc degeneration, lumbar region with discogenic back pain only: Secondary | ICD-10-CM | POA: Diagnosis not present

## 2023-12-09 DIAGNOSIS — M955 Acquired deformity of pelvis: Secondary | ICD-10-CM | POA: Diagnosis not present

## 2023-12-09 DIAGNOSIS — M5136 Other intervertebral disc degeneration, lumbar region with discogenic back pain only: Secondary | ICD-10-CM | POA: Diagnosis not present

## 2023-12-09 DIAGNOSIS — M9905 Segmental and somatic dysfunction of pelvic region: Secondary | ICD-10-CM | POA: Diagnosis not present

## 2023-12-09 DIAGNOSIS — M9903 Segmental and somatic dysfunction of lumbar region: Secondary | ICD-10-CM | POA: Diagnosis not present

## 2023-12-10 DIAGNOSIS — D2372 Other benign neoplasm of skin of left lower limb, including hip: Secondary | ICD-10-CM | POA: Diagnosis not present

## 2023-12-10 DIAGNOSIS — D2371 Other benign neoplasm of skin of right lower limb, including hip: Secondary | ICD-10-CM | POA: Diagnosis not present

## 2023-12-13 DIAGNOSIS — M9905 Segmental and somatic dysfunction of pelvic region: Secondary | ICD-10-CM | POA: Diagnosis not present

## 2023-12-13 DIAGNOSIS — M5136 Other intervertebral disc degeneration, lumbar region with discogenic back pain only: Secondary | ICD-10-CM | POA: Diagnosis not present

## 2023-12-13 DIAGNOSIS — M9903 Segmental and somatic dysfunction of lumbar region: Secondary | ICD-10-CM | POA: Diagnosis not present

## 2023-12-13 DIAGNOSIS — M955 Acquired deformity of pelvis: Secondary | ICD-10-CM | POA: Diagnosis not present

## 2023-12-16 DIAGNOSIS — M9903 Segmental and somatic dysfunction of lumbar region: Secondary | ICD-10-CM | POA: Diagnosis not present

## 2023-12-16 DIAGNOSIS — M9905 Segmental and somatic dysfunction of pelvic region: Secondary | ICD-10-CM | POA: Diagnosis not present

## 2023-12-16 DIAGNOSIS — M955 Acquired deformity of pelvis: Secondary | ICD-10-CM | POA: Diagnosis not present

## 2023-12-16 DIAGNOSIS — M5136 Other intervertebral disc degeneration, lumbar region with discogenic back pain only: Secondary | ICD-10-CM | POA: Diagnosis not present

## 2023-12-18 DIAGNOSIS — M9905 Segmental and somatic dysfunction of pelvic region: Secondary | ICD-10-CM | POA: Diagnosis not present

## 2023-12-18 DIAGNOSIS — M5136 Other intervertebral disc degeneration, lumbar region with discogenic back pain only: Secondary | ICD-10-CM | POA: Diagnosis not present

## 2023-12-18 DIAGNOSIS — M9903 Segmental and somatic dysfunction of lumbar region: Secondary | ICD-10-CM | POA: Diagnosis not present

## 2023-12-18 DIAGNOSIS — M955 Acquired deformity of pelvis: Secondary | ICD-10-CM | POA: Diagnosis not present

## 2023-12-21 DIAGNOSIS — M9903 Segmental and somatic dysfunction of lumbar region: Secondary | ICD-10-CM | POA: Diagnosis not present

## 2023-12-21 DIAGNOSIS — M955 Acquired deformity of pelvis: Secondary | ICD-10-CM | POA: Diagnosis not present

## 2023-12-21 DIAGNOSIS — M9905 Segmental and somatic dysfunction of pelvic region: Secondary | ICD-10-CM | POA: Diagnosis not present

## 2023-12-21 DIAGNOSIS — M5136 Other intervertebral disc degeneration, lumbar region with discogenic back pain only: Secondary | ICD-10-CM | POA: Diagnosis not present

## 2023-12-21 NOTE — Assessment & Plan Note (Signed)
 05/26/2022: Right lumpectomy: 0.2 cm low-grade DCIS, intraductal papilloma, margins negative, ER 100%, PR 100% CHEK2 gene mutation   Treatment plan: 1.adjuvant radiation therapy (after discussing pros and cons of radiation she decided to forego radiation) 2. Followed by antiestrogen therapy with tamoxifen  5 years 3.  CHEK2 mutation   Tamoxifen  toxicities: Hot flashes: Moderate to severe.  Instructed her to change the tamoxifen  from evening to daytime. Leg cramps: Encouraged her to take magnesium gluconate Difficulty staying asleep: We will see if the change in tamoxifen  timing makes a difference.   Breast cancer surveillance: Mammogram 02/11/2023: Benign, density B Breast MRI 08/12/2023: Benign Density B   Return to clinic in 1 year for follow-up

## 2023-12-22 ENCOUNTER — Inpatient Hospital Stay: Payer: Medicare Other | Attending: Hematology and Oncology | Admitting: Hematology and Oncology

## 2023-12-22 ENCOUNTER — Other Ambulatory Visit: Payer: Self-pay | Admitting: Hematology and Oncology

## 2023-12-22 VITALS — BP 123/82 | HR 100 | Temp 98.1°F | Resp 18 | Ht 64.25 in | Wt 156.3 lb

## 2023-12-22 DIAGNOSIS — Z7982 Long term (current) use of aspirin: Secondary | ICD-10-CM | POA: Insufficient documentation

## 2023-12-22 DIAGNOSIS — Z1501 Genetic susceptibility to malignant neoplasm of breast: Secondary | ICD-10-CM | POA: Diagnosis not present

## 2023-12-22 DIAGNOSIS — Z1589 Genetic susceptibility to other disease: Secondary | ICD-10-CM | POA: Diagnosis not present

## 2023-12-22 DIAGNOSIS — D0511 Intraductal carcinoma in situ of right breast: Secondary | ICD-10-CM | POA: Insufficient documentation

## 2023-12-22 DIAGNOSIS — Z79810 Long term (current) use of selective estrogen receptor modulators (SERMs): Secondary | ICD-10-CM | POA: Diagnosis not present

## 2023-12-22 DIAGNOSIS — Z79899 Other long term (current) drug therapy: Secondary | ICD-10-CM | POA: Insufficient documentation

## 2023-12-22 DIAGNOSIS — Z853 Personal history of malignant neoplasm of breast: Secondary | ICD-10-CM

## 2023-12-22 MED ORDER — TAMOXIFEN CITRATE 10 MG PO TABS
10.0000 mg | ORAL_TABLET | Freq: Every day | ORAL | 3 refills | Status: AC
Start: 2023-12-22 — End: ?

## 2023-12-22 NOTE — Progress Notes (Signed)
 Patient Care Team: Avelina Greig BRAVO, MD as PCP - General (Family Medicine) Odean Potts, MD as Consulting Physician (Hematology and Oncology) Aron Shoulders, MD as Consulting Physician (General Surgery) Mittie Gaskin, MD as Referring Physician (Ophthalmology)  DIAGNOSIS:  Encounter Diagnosis  Name Primary?   Ductal carcinoma in situ (DCIS) of right breast Yes    SUMMARY OF ONCOLOGIC HISTORY: Oncology History  Ductal carcinoma in situ (DCIS) of right breast  07/18/2015 Genetic Testing   + CHEK 2 mutation.  The STAT Breast cancer panel offered by [facility] includes sequencing and rearrangement analysis for the following 9 genes:  ATM, BRCA1, BRCA2, CDH1, CHEK2, PALB2, PTEN, STK11 and TP53.      05/26/2022 Initial Diagnosis   Right lumpectomy: 0.2 cm low-grade DCIS, intraductal papilloma, margins negative, ER 100%, PR 100%   06/2022 -  Anti-estrogen oral therapy   Tamoxifen  x 5 years   09/22/2022 Cancer Staging   Staging form: Breast, AJCC 8th Edition - Pathologic: Stage 0 (pTis (DCIS), pN0, cM0, ER+, PR+) - Signed by Crawford Morna Pickle, NP on 09/22/2022     CHIEF COMPLIANT: Follow-up of DCIS on tamoxifen   HISTORY OF PRESENT ILLNESS:   History of Present Illness Rachael Sharp is a 73 year old female with ductal carcinoma in situ (DCIS) who presents for follow-up regarding her breast cancer management.  She has been taking tamoxifen  since February of the previous year to reduce the risk of DCIS recurrence and experiences hot flashes as a side effect, which she manages with a personal fan. Her last mammogram was in October of the previous year, and she plans to schedule her next one soon. An MRI in April and a breast exam in July were normal. She is scheduled for a bone density test next week.  Her hip and back pain, which previously limited her exercise routine, has improved, allowing her to resume her exercise regimen. She had been exercising daily for one and a half  to two hours before the pain began.  She is currently taking tamoxifen  and needs a new prescription.     ALLERGIES:  is allergic to penicillins and epinephrine .  MEDICATIONS:  Current Outpatient Medications  Medication Sig Dispense Refill   amitriptyline  (ELAVIL ) 10 MG tablet TAKE 3 TABLETS BY MOUTH AT BEDTIME 270 tablet 3   aspirin 81 MG EC tablet Take 1 tablet by mouth daily.     BIOTIN PO Take 5,000 mcg by mouth daily.     CALCIUM PO Take 1 tablet by mouth in the morning and at bedtime.     Glucosamine HCl-MSM (GLUCOSAMINE-MSM PO) Take 1 tablet by mouth daily.     Multiple Vitamin (MULTIVITAMIN) tablet Take 2 tablets by mouth daily.      Omega-3 Fatty Acids (OMEGA 3 PO) Take 600 mg by mouth daily.      sertraline  (ZOLOFT ) 25 MG tablet TAKE 1 TABLET (25 MG TOTAL) BY MOUTH DAILY. 90 tablet 1   tamoxifen  (NOLVADEX ) 10 MG tablet TAKE 1 TABLET BY MOUTH EVERY DAY 90 tablet 3   tretinoin (RETIN-A) 0.025 % cream Apply topically at bedtime.     No current facility-administered medications for this visit.    PHYSICAL EXAMINATION: ECOG PERFORMANCE STATUS: 1 - Symptomatic but completely ambulatory  Vitals:   12/22/23 1001  BP: 123/82  Pulse: 100  Resp: 18  Temp: 98.1 F (36.7 C)  SpO2: 96%   Filed Weights   12/22/23 1001  Weight: 156 lb 4.8 oz (70.9 kg)  Physical Exam   (exam performed in the presence of a chaperone)  LABORATORY DATA:  I have reviewed the data as listed    Latest Ref Rng & Units 07/28/2023    8:19 AM 04/22/2023    8:03 AM 04/17/2022    8:59 AM  CMP  Glucose 70 - 99 mg/dL 883  860  890   BUN 6 - 23 mg/dL 18  16  14    Creatinine 0.40 - 1.20 mg/dL 8.93  8.89  8.95   Sodium 135 - 145 mEq/L 138  139  138   Potassium 3.5 - 5.1 mEq/L 4.8  4.9  4.6   Chloride 96 - 112 mEq/L 104  103  103   CO2 19 - 32 mEq/L 28  27  28    Calcium 8.4 - 10.5 mg/dL 9.5  9.2  9.6   Total Protein 6.0 - 8.3 g/dL  6.5  6.7   Total Bilirubin 0.2 - 1.2 mg/dL  0.5  0.4   Alkaline  Phos 39 - 117 U/L  30  39   AST 0 - 37 U/L  25  36   ALT 0 - 35 U/L  24  41     Lab Results  Component Value Date   WBC 5.6 04/22/2023   HGB 15.1 (H) 04/22/2023   HCT 45.5 04/22/2023   MCV 95.7 04/22/2023   PLT 290.0 04/22/2023   NEUTROABS 1.6 04/22/2023    ASSESSMENT & PLAN:  Ductal carcinoma in situ (DCIS) of right breast 05/26/2022: Right lumpectomy: 0.2 cm low-grade DCIS, intraductal papilloma, margins negative, ER 100%, PR 100% CHEK2 gene mutation   Treatment plan: 1.adjuvant radiation therapy (after discussing pros and cons of radiation she decided to forego radiation) 2. Followed by antiestrogen therapy with tamoxifen  5 years started February 2024 3.  CHEK2 mutation   Tamoxifen  toxicities: Hot flashes: Moderate to severe.  Instructed her to change the tamoxifen  from evening to daytime. Leg cramps: Encouraged her to take magnesium gluconate Difficulty staying asleep: We will see if the change in tamoxifen  timing makes a difference.   Breast cancer surveillance: Mammogram 02/11/2023: Benign, density B Breast MRI 08/12/2023: Benign Density B.  Continue to do annual MRIs   Return to clinic in 1 year for follow-up ------------------------------------- Assessment and Plan Assessment & Plan Ductal carcinoma in situ (DCIS) of right breast, status post treatment, on adjuvant tamoxifen  therapy DCIS of the right breast, status post treatment, on adjuvant tamoxifen  since February 2024. Tamoxifen  reduces breast density, improving mammogram accuracy. Studies show no benefit of tamoxifen  beyond five years for DCIS, but it reduces future invasive cancer or DCIS risk. - Continue tamoxifen  therapy for a total of five years. - Order diagnostic mammogram for February 14, 2024. - Order MRI for August 14, 2024. - Send tamoxifen  prescription to [facility] in [place], [employer] - Discuss potential relocation and recommend finding a primary care doctor in the new area for further oncologist  referral.  CHEK2 gene mutation (genetic susceptibility to breast cancer) CHEK2 gene mutation increases genetic susceptibility to breast cancer. Regular screening and monitoring are in place. - Continue regular screening with mammograms and MRIs as per current schedule. - Discuss potential participation in [facility] for additional genetic and epigenomic studies.  Hot flashes secondary to tamoxifen  therapy Experiencing hot flashes due to tamoxifen . Managing with lifestyle modifications, prefers no medication unless necessary. - Continue current management of hot flashes with lifestyle modifications.      No orders of the  defined types were placed in this encounter.  The patient has a good understanding of the overall plan. she agrees with it. she will call with any problems that may develop before the next visit here. Total time spent: 30 mins including face to face time and time spent for planning, charting and co-ordination of care   Naomi MARLA Chad, MD 12/22/23

## 2023-12-24 DIAGNOSIS — M5136 Other intervertebral disc degeneration, lumbar region with discogenic back pain only: Secondary | ICD-10-CM | POA: Diagnosis not present

## 2023-12-24 DIAGNOSIS — M9903 Segmental and somatic dysfunction of lumbar region: Secondary | ICD-10-CM | POA: Diagnosis not present

## 2023-12-24 DIAGNOSIS — M9905 Segmental and somatic dysfunction of pelvic region: Secondary | ICD-10-CM | POA: Diagnosis not present

## 2023-12-24 DIAGNOSIS — M955 Acquired deformity of pelvis: Secondary | ICD-10-CM | POA: Diagnosis not present

## 2023-12-27 ENCOUNTER — Ambulatory Visit
Admission: RE | Admit: 2023-12-27 | Discharge: 2023-12-27 | Disposition: A | Discharge status: Home / Self Care | Source: Ambulatory Visit | Attending: Family Medicine | Admitting: Family Medicine

## 2023-12-27 ENCOUNTER — Other Ambulatory Visit: Payer: Medicare Other

## 2023-12-27 DIAGNOSIS — E2839 Other primary ovarian failure: Secondary | ICD-10-CM | POA: Insufficient documentation

## 2023-12-27 DIAGNOSIS — Z780 Asymptomatic menopausal state: Secondary | ICD-10-CM | POA: Diagnosis not present

## 2023-12-28 ENCOUNTER — Ambulatory Visit: Payer: Self-pay | Admitting: Family Medicine

## 2023-12-28 DIAGNOSIS — M9903 Segmental and somatic dysfunction of lumbar region: Secondary | ICD-10-CM | POA: Diagnosis not present

## 2023-12-28 DIAGNOSIS — M5136 Other intervertebral disc degeneration, lumbar region with discogenic back pain only: Secondary | ICD-10-CM | POA: Diagnosis not present

## 2023-12-28 DIAGNOSIS — M9905 Segmental and somatic dysfunction of pelvic region: Secondary | ICD-10-CM | POA: Diagnosis not present

## 2023-12-28 DIAGNOSIS — M955 Acquired deformity of pelvis: Secondary | ICD-10-CM | POA: Diagnosis not present

## 2023-12-31 DIAGNOSIS — M9903 Segmental and somatic dysfunction of lumbar region: Secondary | ICD-10-CM | POA: Diagnosis not present

## 2023-12-31 DIAGNOSIS — M955 Acquired deformity of pelvis: Secondary | ICD-10-CM | POA: Diagnosis not present

## 2023-12-31 DIAGNOSIS — M5136 Other intervertebral disc degeneration, lumbar region with discogenic back pain only: Secondary | ICD-10-CM | POA: Diagnosis not present

## 2023-12-31 DIAGNOSIS — M9905 Segmental and somatic dysfunction of pelvic region: Secondary | ICD-10-CM | POA: Diagnosis not present

## 2024-01-06 DIAGNOSIS — M5136 Other intervertebral disc degeneration, lumbar region with discogenic back pain only: Secondary | ICD-10-CM | POA: Diagnosis not present

## 2024-01-06 DIAGNOSIS — M955 Acquired deformity of pelvis: Secondary | ICD-10-CM | POA: Diagnosis not present

## 2024-01-06 DIAGNOSIS — M9903 Segmental and somatic dysfunction of lumbar region: Secondary | ICD-10-CM | POA: Diagnosis not present

## 2024-01-06 DIAGNOSIS — M9905 Segmental and somatic dysfunction of pelvic region: Secondary | ICD-10-CM | POA: Diagnosis not present

## 2024-01-13 DIAGNOSIS — M9903 Segmental and somatic dysfunction of lumbar region: Secondary | ICD-10-CM | POA: Diagnosis not present

## 2024-01-13 DIAGNOSIS — M9905 Segmental and somatic dysfunction of pelvic region: Secondary | ICD-10-CM | POA: Diagnosis not present

## 2024-01-13 DIAGNOSIS — M955 Acquired deformity of pelvis: Secondary | ICD-10-CM | POA: Diagnosis not present

## 2024-01-13 DIAGNOSIS — M5136 Other intervertebral disc degeneration, lumbar region with discogenic back pain only: Secondary | ICD-10-CM | POA: Diagnosis not present

## 2024-01-28 DIAGNOSIS — M9903 Segmental and somatic dysfunction of lumbar region: Secondary | ICD-10-CM | POA: Diagnosis not present

## 2024-01-28 DIAGNOSIS — M5136 Other intervertebral disc degeneration, lumbar region with discogenic back pain only: Secondary | ICD-10-CM | POA: Diagnosis not present

## 2024-01-28 DIAGNOSIS — M955 Acquired deformity of pelvis: Secondary | ICD-10-CM | POA: Diagnosis not present

## 2024-01-28 DIAGNOSIS — M9905 Segmental and somatic dysfunction of pelvic region: Secondary | ICD-10-CM | POA: Diagnosis not present

## 2024-02-04 DIAGNOSIS — M5136 Other intervertebral disc degeneration, lumbar region with discogenic back pain only: Secondary | ICD-10-CM | POA: Diagnosis not present

## 2024-02-04 DIAGNOSIS — M9905 Segmental and somatic dysfunction of pelvic region: Secondary | ICD-10-CM | POA: Diagnosis not present

## 2024-02-04 DIAGNOSIS — M9903 Segmental and somatic dysfunction of lumbar region: Secondary | ICD-10-CM | POA: Diagnosis not present

## 2024-02-04 DIAGNOSIS — M955 Acquired deformity of pelvis: Secondary | ICD-10-CM | POA: Diagnosis not present

## 2024-02-15 ENCOUNTER — Ambulatory Visit
Admission: RE | Admit: 2024-02-15 | Discharge: 2024-02-15 | Disposition: A | Discharge status: Home / Self Care | Source: Ambulatory Visit | Attending: Hematology and Oncology | Admitting: Hematology and Oncology

## 2024-02-15 ENCOUNTER — Ambulatory Visit: Payer: Self-pay | Admitting: Family Medicine

## 2024-02-15 DIAGNOSIS — D0511 Intraductal carcinoma in situ of right breast: Secondary | ICD-10-CM

## 2024-02-15 DIAGNOSIS — R928 Other abnormal and inconclusive findings on diagnostic imaging of breast: Secondary | ICD-10-CM | POA: Diagnosis not present

## 2024-02-15 DIAGNOSIS — Z853 Personal history of malignant neoplasm of breast: Secondary | ICD-10-CM

## 2024-03-09 ENCOUNTER — Ambulatory Visit: Payer: Medicare Other

## 2024-03-09 VITALS — BP 118/76 | Ht 64.25 in | Wt 157.8 lb

## 2024-03-09 DIAGNOSIS — Z0000 Encounter for general adult medical examination without abnormal findings: Secondary | ICD-10-CM

## 2024-03-09 NOTE — Progress Notes (Signed)
 Subjective:   Rachael Sharp is a 73 y.o. who presents for a Medicare Wellness preventive visit.  As a reminder, Annual Wellness Visits don't include a physical exam, and some assessments may be limited, especially if this visit is performed virtually. We may recommend an in-person follow-up visit with your provider if needed.  Visit Complete: In person  Persons Participating in Visit: Patient.  AWV Questionnaire: Yes: Patient Medicare AWV questionnaire was completed by the patient on 03/08/24; I have confirmed that all information answered by patient is correct and no changes since this date.  Cardiac Risk Factors include: advanced age (>92men, >28 women)     Objective:    Today's Vitals   03/09/24 1136  BP: 118/76  Weight: 157 lb 12.8 oz (71.6 kg)  Height: 5' 4.25 (1.632 m)   Body mass index is 26.88 kg/m.     03/09/2024   11:40 AM 12/22/2023   10:16 AM 03/08/2023   11:33 AM 12/22/2022   11:42 AM 09/22/2022   10:48 AM 06/17/2022   10:11 AM 06/04/2022    1:50 PM  Advanced Directives  Does Patient Have a Medical Advance Directive? Yes No Yes Yes Yes Yes Yes  Type of Estate Agent of [place];Living will  Healthcare Power of [place];Living will Healthcare Power of [place] of [place];Living will Living will Healthcare Power of [place];Living will  Does patient want to make changes to medical advance directive?       No - Patient declined  Copy of Healthcare Power of Attorney in Chart? No - copy requested  No - copy requested   No - copy requested No - copy requested  Would patient like information on creating a medical advance directive?  No - Patient declined     No - Patient declined    Current Medications (verified) Outpatient Encounter Medications as of 03/09/2024  Medication Sig   amitriptyline  (ELAVIL ) 10 MG tablet TAKE 3 TABLETS BY MOUTH AT BEDTIME   aspirin 81 MG EC tablet Take 1 tablet by mouth daily.   BIOTIN PO  Take 5,000 mcg by mouth daily.   CALCIUM PO Take 1 tablet by mouth in the morning and at bedtime.   Glucosamine HCl-MSM (GLUCOSAMINE-MSM PO) Take 1 tablet by mouth daily.   Multiple Vitamin (MULTIVITAMIN) tablet Take 2 tablets by mouth daily.    Omega-3 Fatty Acids (OMEGA 3 PO) Take 600 mg by mouth daily.    sertraline  (ZOLOFT ) 25 MG tablet TAKE 1 TABLET (25 MG TOTAL) BY MOUTH DAILY.   tamoxifen  (NOLVADEX ) 10 MG tablet Take 1 tablet (10 mg total) by mouth daily.   tretinoin (RETIN-A) 0.025 % cream Apply topically at bedtime.   No facility-administered encounter medications on file as of 03/09/2024.    Allergies (verified) Penicillins and Epinephrine    History: Past Medical History:  Diagnosis Date   Allergy 1969, penicillin   1985, epinephrine    Anxiety    Arthritis    finger left index   Breast cancer (HCC) 05/26/2022   GERD (gastroesophageal reflux disease)    Heart murmur    mitral valve prolapse   Hyperlipidemia    denies 10/21/18,not on medication   Hyperplastic colon polyp    Monoallelic mutation of CHEK2 gene in female patient    Vitamin D  deficiency    Past Surgical History:  Procedure Laterality Date   ABDOMINAL HYSTERECTOMY  12/2000   BREAST BIOPSY     biopsy x 2 , 1976 and 2002  BREAST BIOPSY  05/21/2022   MM RT RADIOACTIVE SEED LOC MAMMO GUIDE 05/21/2022 [facility]   BREAST CYST ASPIRATION     BREAST LUMPECTOMY Right 05/2022   COLONOSCOPY  11/03/2018   Dr.Pyrtle   COLONOSCOPY  2017   EYE SURGERY  2017   LASER REPAIR   LIPOMA EXCISION  1983   RADIOACTIVE SEED GUIDED EXCISIONAL BREAST BIOPSY Right 05/26/2022   Procedure: RADIOACTIVE SEED GUIDED EXCISIONAL RIGHT BREAST BIOPSY;  Surgeon: Aron Shoulders, MD;  Location: [facility];  Service: General;  Laterality: Right;   TONSILLECTOMY  1964   TUBAL LIGATION  1982   Family History  Problem Relation Age of Onset   COPD Sister    Heart disease Sister 28       massive CVA,     Stroke  Sister    Early death Sister    Stroke Mother    COPD Mother    Heart disease Father    Hyperlipidemia Father    COPD Father    Breast cancer Paternal Aunt    Breast cancer Paternal Aunt    Breast cancer Paternal Aunt    Breast cancer Paternal Aunt    Cancer Paternal Aunt    Cancer Paternal Aunt    Cancer Paternal Aunt    Cancer Paternal Aunt    Colon cancer Neg Hx    Colon polyps Neg Hx    Esophageal cancer Neg Hx    Rectal cancer Neg Hx    Stomach cancer Neg Hx    Social History   Socioeconomic History   Marital status: Married    Spouse name: Not on file   Number of children: Not on file   Years of education: Not on file   Highest education level: Associate degree: academic program  Occupational History   Occupation: retired  Tobacco Use   Smoking status: Former    Current packs/day: 0.00    Average packs/day: 0.3 packs/day for 20.0 years (5.0 ttl pk-yrs)    Types: Cigarettes    Start date: 06/02/1969    Quit date: 06/02/1989    Years since quitting: 34.7   Smokeless tobacco: Never   Tobacco comments:    Quit 1991  Vaping Use   Vaping status: Never Used  Substance and Sexual Activity   Alcohol use: Yes    Alcohol/week: 7.0 standard drinks of alcohol    Types: 7 Standard drinks or equivalent per week    Comment: nightly wine   Drug use: No   Sexual activity: Not Currently    Partners: Male    Comment: 1st intercourse-19, partners- 4, married- 40 yr s  Other Topics Concern   Not on file  Social History Narrative   Not on file   Social Drivers of Health   Financial Resource Strain: Low Risk  (03/08/2024)   Overall Financial Resource Strain (CARDIA)    Difficulty of Paying Living Expenses: Not hard at all  Food Insecurity: No Food Insecurity (03/08/2024)   Hunger Vital Sign    Worried About Running Out of Food in the Last Year: Never true    Ran Out of Food in the Last Year: Never true  Transportation Needs: No Transportation Needs (03/08/2024)    PRAPARE - Administrator, Civil Service (Medical): No    Lack of Transportation (Non-Medical): No  Physical Activity: Sufficiently Active (03/08/2024)   Exercise Vital Sign    Days of Exercise per Week: 4 days    Minutes of  Exercise per Session: 60 min  Stress: No Stress Concern Present (03/08/2024)   [employer] of Occupational Health - Occupational Stress Questionnaire    Feeling of Stress: Only a little  Social Connections: Moderately Integrated (03/08/2024)   Social Connection and Isolation Panel    Frequency of Communication with Friends and Family: More than three times a week    Frequency of Social Gatherings with Friends and Family: Twice a week    Attends Religious Services: Patient declined    Database Administrator or Organizations: Yes    Attends Engineer, Structural: Not on file    Marital Status: Married    Tobacco Counseling Counseling given: Not Answered Tobacco comments: Quit 1991    Clinical Intake:  Pre-visit preparation completed: Yes  Pain : No/denies pain     BMI - recorded: 26.88 Nutritional Status: BMI 25 -29 Overweight Nutritional Risks: None Diabetes: No  Lab Results  Component Value Date   HGBA1C 6.2 07/28/2023   HGBA1C 6.2 04/22/2023   HGBA1C 5.8 (A) 04/22/2022     How often do you need to have someone help you when you read instructions, pamphlets, or other written materials from your doctor or pharmacy?: 1 - Never  Interpreter Needed?: No  Comments: lives with husband Information entered by :: B.Obediah Welles,LPN   Activities of Daily Living     03/08/2024    1:40 PM  In your present state of health, do you have any difficulty performing the following activities:  Hearing? 0  Vision? 0  Difficulty concentrating or making decisions? 0  Walking or climbing stairs? 0  Dressing or bathing? 0  Doing errands, shopping? 0  Preparing Food and eating ? N  Using the Toilet? N  In the past six months, have you  accidently leaked urine? N  Do you have problems with loss of bowel control? N  Managing your Medications? N  Managing your Finances? N  Housekeeping or managing your Housekeeping? N    Patient Care Team: Avelina Greig BRAVO, MD as PCP - General (Family Medicine) Odean Potts, MD as Consulting Physician (Hematology and Oncology) Aron Shoulders, MD as Consulting Physician (General Surgery) Mittie Gaskin, MD as Referring Physician (Ophthalmology)  I have updated your Care Teams any recent Medical Services you may have received from other providers in the past year.     Assessment:   This is a routine wellness examination for Ariyon.  Hearing/Vision screen Hearing Screening - Comments:: Patient denies any hearing difficulties.   Vision Screening - Comments:: Pt says their vision is good without glasses Dr  Mittie   Goals Addressed             This Visit's Progress    COMPLETED: Patient Stated   On track    No new goals:happy with physical health     Weight (lb) < 200 lb (90.7 kg)   157 lb 12.8 oz (71.6 kg)    10-15lb: eating less and exercise more       Depression Screen     03/09/2024   11:38 AM 05/26/2023   10:50 AM 03/08/2023   11:27 AM 11/11/2022    9:12 AM 06/24/2022    2:20 PM 06/04/2022    2:09 PM 03/04/2022    9:06 AM  PHQ 2/9 Scores  PHQ - 2 Score 0 0 0 0 0 0 0  PHQ- 9 Score  1  1 2   0    Fall Risk     03/08/2024  1:40 PM 05/26/2023   10:50 AM 03/04/2023    1:36 PM 11/11/2022    9:12 AM 03/04/2022    9:08 AM  Fall Risk   Falls in the past year? 0 0 1 0 0  Number falls in past yr: 0 0 0 0 0  Injury with Fall? 0 0 1 0 0  Risk for fall due to : No Fall Risks No Fall Risks No Fall Risks No Fall Risks No Fall Risks  Follow up Falls prevention discussed;Education provided Falls evaluation completed Education provided;Falls prevention discussed Falls evaluation completed Falls prevention discussed;Falls evaluation completed      Data saved with a  previous flowsheet row definition    MEDICARE RISK AT HOME:  Medicare Risk at Home Any stairs in or around the home?: (Patient-Rptd) Yes If so, are there any without handrails?: (Patient-Rptd) No Home free of loose throw rugs in walkways, pet beds, electrical cords, etc?: (Patient-Rptd) Yes Adequate lighting in your home to reduce risk of falls?: (Patient-Rptd) Yes Life alert?: (Patient-Rptd) No Use of a cane, walker or w/c?: (Patient-Rptd) No Grab bars in the bathroom?: (Patient-Rptd) No Shower chair or bench in shower?: (Patient-Rptd) Yes Elevated toilet seat or a handicapped toilet?: (Patient-Rptd) No  TIMED UP AND GO:  Was the test performed?  Yes  Length of time to ambulate 10 feet: 10 sec Gait steady and fast without use of assistive device  Cognitive Function: 6CIT completed    01/23/2020   10:02 AM 01/20/2019    2:17 PM 01/12/2018    8:06 AM  MMSE - Mini Mental State Exam  Orientation to time 5 5 5   Orientation to Place 5 5 5   Registration 3 3 3   Attention/ Calculation 5 5 0  Recall 3 3 3   Language- name 2 objects  0 0  Language- repeat 1 1 1   Language- follow 3 step command  0 3  Language- read & follow direction  0 0  Write a sentence  0 0  Copy design  0 0  Total score  22 20        03/09/2024   11:42 AM 03/08/2023   11:36 AM 03/04/2022    9:09 AM 01/25/2021    9:27 AM  6CIT Screen  What Year? 0 points 0 points 0 points 0 points  What month? 0 points 0 points 0 points 0 points  What time? 0 points 0 points 0 points 0 points  Count back from 20 0 points 0 points 0 points 0 points  Months in reverse 0 points 0 points 0 points 0 points  Repeat phrase 0 points 0 points 0 points 0 points  Total Score 0 points 0 points 0 points 0 points    Immunizations Immunization History  Administered Date(s) Administered   Fluad Quad(high Dose 65+) 01/24/2019, 01/01/2020, 01/16/2021, 01/11/2022   Fluad Trivalent(High Dose 65+) 02/16/2024   INFLUENZA, HIGH DOSE  SEASONAL PF 02/25/2017, 01/11/2018, 01/09/2023   Influenza Split 03/08/2012, 02/11/2014, 02/09/2015   Influenza Whole 04/05/2011   Influenza,inj,Quad PF,6+ Mos 03/13/2013   Influenza-Unspecified 01/30/2016   PFIZER Comirnaty(Gray Top)Covid-19 Tri-Sucrose Vaccine 02/01/2022   PFIZER(Purple Top)SARS-COV-2 Vaccination 06/17/2019, 07/12/2019, 03/09/2020   Pfizer Covid-19 Vaccine Bivalent Booster 24yrs & up 08/13/2020, 08/30/2021, 02/24/2024   Pneumococcal Conjugate-13 01/08/2017   Pneumococcal Polysaccharide-23 03/17/2018   Tdap 11/09/2010   Zoster Recombinant(Shingrix) 02/07/2020, 06/04/2020   Zoster, Live 03/08/2012    Screening Tests Health Maintenance  Topic Date Due   DTaP/Tdap/Td (2 - Td or  Tdap) 11/08/2020   COVID-19 Vaccine (8 - 2025-26 season) 04/20/2024   Colonoscopy  12/09/2024   Mammogram  02/14/2025   Medicare Annual Wellness (AWV)  03/09/2025   DEXA SCAN  12/26/2028   Pneumococcal Vaccine: 50+ Years  Completed   Influenza Vaccine  Completed   Hepatitis C Screening  Completed   Zoster Vaccines- Shingrix  Completed   Meningococcal B Vaccine  Aged Out    Health Maintenance Items Addressed: None due at this time  Additional Screening:  Vision Screening: Recommended annual ophthalmology exams for early detection of glaucoma and other disorders of the eye. Is the patient up to date with their annual eye exam?  Yes  Who is the provider or what is the name of the office in which the patient attends annual eye exams? Dr Mittie  Dental Screening: Recommended annual dental exams for proper oral hygiene  Community Resource Referral / Chronic Care Management: CRR required this visit?  No   CCM required this visit?  No   Plan:    I have personally reviewed and noted the following in the patient's chart:   Medical and social history Use of alcohol, tobacco or illicit drugs  Current medications and supplements including opioid prescriptions. Patient is not  currently taking opioid prescriptions. Functional ability and status Nutritional status Physical activity Advanced directives List of other physicians Hospitalizations, surgeries, and ER visits in previous 12 months Vitals Screenings to include cognitive, depression, and falls Referrals and appointments  In addition, I have reviewed and discussed with patient certain preventive protocols, quality metrics, and best practice recommendations. A written personalized care plan for preventive services as well as general preventive health recommendations were provided to patient.   Erminio LITTIE Saris, LPN   [id number]   After Visit Summary: (MyChart) Due to this being a telephonic visit, the after visit summary with patients personalized plan was offered to patient via MyChart   Notes: Nothing significant to report at this time.

## 2024-03-09 NOTE — Patient Instructions (Signed)
 Rachael Sharp,  Thank you for taking the time for your Medicare Wellness Visit. I appreciate your continued commitment to your health goals. Please review the care plan we discussed, and feel free to reach out if I can assist you further.  Medicare recommends these wellness visits once per year to help you and your care team stay ahead of potential health issues. These visits are designed to focus on prevention, allowing your provider to concentrate on managing your acute and chronic conditions during your regular appointments.  Please note that Annual Wellness Visits do not include a physical exam. Some assessments may be limited, especially if the visit was conducted virtually. If needed, we may recommend a separate in-person follow-up with your provider.  Ongoing Care Seeing your primary care provider every 3 to 6 months helps us  monitor your health and provide consistent, personalized care.   Referrals If a referral was made during today's visit and you haven't received any updates within two weeks, please contact the referred provider directly to check on the status.  Recommended Screenings:  Health Maintenance  Topic Date Due   DTaP/Tdap/Td vaccine (2 - Td or Tdap) 11/08/2020   COVID-19 Vaccine (8 - 2025-26 season) 04/20/2024   Colon Cancer Screening  12/09/2024   Breast Cancer Screening  02/14/2025   Medicare Annual Wellness Visit  03/09/2025   DEXA scan (bone density measurement)  12/26/2028   Pneumococcal Vaccine for age over 14  Completed   Flu Shot  Completed   Hepatitis C Screening  Completed   Zoster (Shingles) Vaccine  Completed   Meningitis B Vaccine  Aged Out       03/09/2024   11:40 AM  Advanced Directives  Does Patient Have a Medical Advance Directive? Yes  Type of Estate Agent of [place];Living will  Copy of Healthcare Power of Attorney in Chart? No - copy requested   Advance Care Planning is important because it: Ensures you receive  medical care that aligns with your values, goals, and preferences. Provides guidance to your family and loved ones, reducing the emotional burden of decision-making during critical moments.  Vision: Annual vision screenings are recommended for early detection of glaucoma, cataracts, and diabetic retinopathy. These exams can also reveal signs of chronic conditions such as diabetes and high blood pressure.  Dental: Annual dental screenings help detect early signs of oral cancer, gum disease, and other conditions linked to overall health, including heart disease and diabetes.

## 2024-03-16 DIAGNOSIS — M955 Acquired deformity of pelvis: Secondary | ICD-10-CM | POA: Diagnosis not present

## 2024-03-16 DIAGNOSIS — M9903 Segmental and somatic dysfunction of lumbar region: Secondary | ICD-10-CM | POA: Diagnosis not present

## 2024-03-16 DIAGNOSIS — M9905 Segmental and somatic dysfunction of pelvic region: Secondary | ICD-10-CM | POA: Diagnosis not present

## 2024-03-16 DIAGNOSIS — M5136 Other intervertebral disc degeneration, lumbar region with discogenic back pain only: Secondary | ICD-10-CM | POA: Diagnosis not present

## 2024-04-05 ENCOUNTER — Other Ambulatory Visit: Payer: Self-pay | Admitting: Family Medicine

## 2024-04-14 ENCOUNTER — Telehealth: Payer: Self-pay | Admitting: *Deleted

## 2024-04-14 DIAGNOSIS — Z79899 Other long term (current) drug therapy: Secondary | ICD-10-CM

## 2024-04-14 DIAGNOSIS — E7800 Pure hypercholesterolemia, unspecified: Secondary | ICD-10-CM

## 2024-04-14 DIAGNOSIS — R7303 Prediabetes: Secondary | ICD-10-CM

## 2024-04-14 DIAGNOSIS — E559 Vitamin D deficiency, unspecified: Secondary | ICD-10-CM

## 2024-04-14 NOTE — Telephone Encounter (Signed)
-----   Message from Harlene Du sent at 04/14/2024 10:39 AM EST ----- Regarding: Lab Friday 05/05/24 Hello,  Patient is coming in for CPE labs. Can we get orders please.   Thanks

## 2024-04-25 ENCOUNTER — Other Ambulatory Visit: Payer: Medicare Other

## 2024-05-02 ENCOUNTER — Encounter: Payer: Medicare Other | Admitting: Family Medicine

## 2024-05-05 ENCOUNTER — Other Ambulatory Visit (INDEPENDENT_AMBULATORY_CARE_PROVIDER_SITE_OTHER)

## 2024-05-05 DIAGNOSIS — R7303 Prediabetes: Secondary | ICD-10-CM | POA: Diagnosis not present

## 2024-05-05 DIAGNOSIS — E7800 Pure hypercholesterolemia, unspecified: Secondary | ICD-10-CM

## 2024-05-05 DIAGNOSIS — Z79899 Other long term (current) drug therapy: Secondary | ICD-10-CM | POA: Diagnosis not present

## 2024-05-05 DIAGNOSIS — E559 Vitamin D deficiency, unspecified: Secondary | ICD-10-CM

## 2024-05-05 LAB — CBC WITH DIFFERENTIAL/PLATELET
Basophils Absolute: 0 K/uL (ref 0.0–0.1)
Basophils Relative: 0.7 % (ref 0.0–3.0)
Eosinophils Absolute: 0.1 K/uL (ref 0.0–0.7)
Eosinophils Relative: 1.7 % (ref 0.0–5.0)
HCT: 42 % (ref 36.0–46.0)
Hemoglobin: 14.1 g/dL (ref 12.0–15.0)
Lymphocytes Relative: 56.5 % — ABNORMAL HIGH (ref 12.0–46.0)
Lymphs Abs: 3.3 K/uL (ref 0.7–4.0)
MCHC: 33.5 g/dL (ref 30.0–36.0)
MCV: 94.7 fl (ref 78.0–100.0)
Monocytes Absolute: 0.3 K/uL (ref 0.1–1.0)
Monocytes Relative: 4.7 % (ref 3.0–12.0)
Neutro Abs: 2.1 K/uL (ref 1.4–7.7)
Neutrophils Relative %: 36.4 % — ABNORMAL LOW (ref 43.0–77.0)
Platelets: 262 K/uL (ref 150.0–400.0)
RBC: 4.43 Mil/uL (ref 3.87–5.11)
RDW: 14.5 % (ref 11.5–15.5)
WBC: 5.8 K/uL (ref 4.0–10.5)

## 2024-05-05 LAB — COMPREHENSIVE METABOLIC PANEL WITH GFR
ALT: 30 U/L (ref 3–35)
AST: 29 U/L (ref 5–37)
Albumin: 4.1 g/dL (ref 3.5–5.2)
Alkaline Phosphatase: 27 U/L — ABNORMAL LOW (ref 39–117)
BUN: 17 mg/dL (ref 6–23)
CO2: 27 meq/L (ref 19–32)
Calcium: 8.6 mg/dL (ref 8.4–10.5)
Chloride: 105 meq/L (ref 96–112)
Creatinine, Ser: 1.04 mg/dL (ref 0.40–1.20)
GFR: 53.41 mL/min — ABNORMAL LOW
Glucose, Bld: 114 mg/dL — ABNORMAL HIGH (ref 70–99)
Potassium: 5.1 meq/L (ref 3.5–5.1)
Sodium: 140 meq/L (ref 135–145)
Total Bilirubin: 0.5 mg/dL (ref 0.2–1.2)
Total Protein: 6 g/dL (ref 6.0–8.3)

## 2024-05-05 LAB — LIPID PANEL
Cholesterol: 174 mg/dL (ref 28–200)
HDL: 63.5 mg/dL
LDL Cholesterol: 88 mg/dL (ref 10–99)
NonHDL: 110.81
Total CHOL/HDL Ratio: 3
Triglycerides: 115 mg/dL (ref 10.0–149.0)
VLDL: 23 mg/dL (ref 0.0–40.0)

## 2024-05-05 LAB — VITAMIN D 25 HYDROXY (VIT D DEFICIENCY, FRACTURES): VITD: 34.8 ng/mL (ref 30.00–100.00)

## 2024-05-08 LAB — HEMOGLOBIN A1C: Hgb A1c MFr Bld: 6.2 % (ref 4.6–6.5)

## 2024-05-09 ENCOUNTER — Ambulatory Visit: Payer: Self-pay | Admitting: Family Medicine

## 2024-05-09 NOTE — Progress Notes (Signed)
 No critical labs need to be addressed urgently. We will discuss labs in detail at upcoming office visit.

## 2024-05-12 ENCOUNTER — Ambulatory Visit: Admitting: Family Medicine

## 2024-05-12 VITALS — BP 104/72 | HR 104 | Temp 99.7°F | Ht 64.0 in | Wt 161.1 lb

## 2024-05-12 DIAGNOSIS — R7303 Prediabetes: Secondary | ICD-10-CM

## 2024-05-12 DIAGNOSIS — D0511 Intraductal carcinoma in situ of right breast: Secondary | ICD-10-CM

## 2024-05-12 DIAGNOSIS — F411 Generalized anxiety disorder: Secondary | ICD-10-CM | POA: Diagnosis not present

## 2024-05-12 DIAGNOSIS — N904 Leukoplakia of vulva: Secondary | ICD-10-CM | POA: Diagnosis not present

## 2024-05-12 MED ORDER — AMITRIPTYLINE HCL 50 MG PO TABS: 50.0000 mg | ORAL_TABLET | Freq: Every day | ORAL | 1 refills | Status: AC

## 2024-05-12 NOTE — Assessment & Plan Note (Signed)
 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 Amitriptyline   30 mg at bedtimeused for this...  given flare interested in  increasing dose.  Amitriptyline  50 mg po qHS

## 2024-05-12 NOTE — Assessment & Plan Note (Signed)
 Encouraged exercise, weight loss, healthy eating habits. ? ?

## 2024-05-12 NOTE — Assessment & Plan Note (Signed)
Stable, chronic.  Continue current medication.  well controlled on sertraline 25 mg daily.

## 2024-05-12 NOTE — Progress Notes (Signed)
 "   Patient ID: Rachael Sharp, female    DOB: 08/21/1950, 74 y.o.   MRN: [id number]  This visit was conducted in person.  BP 104/72   Pulse (!) 104   Temp 99.7 F (37.6 C) (Temporal)   Ht 5' 4 (1.626 m)   Wt 161 lb 2 oz (73.1 kg)   SpO2 97%   BMI 27.66 kg/m    CC:  Chief Complaint  Patient presents with   Medical Management of Chronic Issues    MWV 03/09/24    Subjective:   HPI: Rachael Sharp is a 74 y.o. female presenting on 05/12/2024 for Medical Management of Chronic Issues (MWV 03/09/24)  The patient presents for review of chronic health problems. He/She also has the following acute concerns today:  none  AMW 03/09/2024 reviewed.  Reviewed labs in detail.   Cholesterol  Lab Results  Component Value Date   CHOL 174 05/05/2024   HDL 63.50 05/05/2024   LDLCALC 88 05/05/2024   LDLDIRECT 149.0 03/22/2013   TRIG 115.0 05/05/2024   CHOLHDL 3 05/05/2024   .The 10-year ASCVD risk score (Arnett DK, et al., 2019) is: 8.6%   Values used to calculate the score:     Age: 49 years     Clinically relevant sex: Female     Is Non-Hispanic African American: No     Diabetic: No     Tobacco smoker: No     Systolic Blood Pressure: 104 mmHg     Is BP treated: No     HDL Cholesterol: 63.5 mg/dL     Total Cholesterol: 174 mg/dL  Diet: heart healthy Exercise: walking Wt Readings from Last 3 Encounters:  05/12/24 161 lb 2 oz (73.1 kg)  03/09/24 157 lb 12.8 oz (71.6 kg)  12/22/23 156 lb 4.8 oz (70.9 kg)     Prediabetes   stable control Lab Results  Component Value Date   HGBA1C 6.2 05/05/2024   GAD well controlled on sertraline  25 mg daily.   She is having some issues with sleep interruption..  Amitriptyline  at night.    Had flare up of typical symptoms of vulvadynia. Amitriptyline   30 mg at bedtime used for this...  given flare interested in  increasing dose.  Relevant past medical, surgical, family and social history reviewed and updated as indicated.  Interim medical history since our last visit reviewed. Allergies and medications reviewed and updated. Outpatient Medications Prior to Visit  Medication Sig Dispense Refill   aspirin 81 MG EC tablet Take 1 tablet by mouth daily.     BIOTIN PO Take 5,000 mcg by mouth daily.     CALCIUM PO Take 1 tablet by mouth in the morning and at bedtime.     Glucosamine HCl-MSM (GLUCOSAMINE-MSM PO) Take 1 tablet by mouth daily.     Multiple Vitamin (MULTIVITAMIN) tablet Take 2 tablets by mouth daily.      Omega-3 Fatty Acids (OMEGA 3 PO) Take 600 mg by mouth daily.      sertraline  (ZOLOFT ) 25 MG tablet TAKE 1 TABLET (25 MG TOTAL) BY MOUTH DAILY. 90 tablet 0   tamoxifen  (NOLVADEX ) 10 MG tablet Take 1 tablet (10 mg total) by mouth daily. 90 tablet 3   tretinoin (RETIN-A) 0.025 % cream Apply topically at bedtime.     amitriptyline  (ELAVIL ) 10 MG tablet TAKE 3 TABLETS BY MOUTH AT BEDTIME 270 tablet 3   No facility-administered medications prior to visit.     Per HPI unless specifically indicated  in ROS section below Review of Systems  Constitutional:  Negative for fatigue and fever.  HENT:  Negative for congestion.   Eyes:  Negative for pain.  Respiratory:  Negative for cough and shortness of breath.   Cardiovascular:  Negative for chest pain, palpitations and leg swelling.  Gastrointestinal:  Negative for abdominal pain.  Genitourinary:  Negative for dysuria and vaginal bleeding.  Musculoskeletal:  Negative for back pain.  Neurological:  Negative for syncope, light-headedness and headaches.  Psychiatric/Behavioral:  Negative for dysphoric mood.    Objective:  BP 104/72   Pulse (!) 104   Temp 99.7 F (37.6 C) (Temporal)   Ht 5' 4 (1.626 m)   Wt 161 lb 2 oz (73.1 kg)   SpO2 97%   BMI 27.66 kg/m   Wt Readings from Last 3 Encounters:  05/12/24 161 lb 2 oz (73.1 kg)  03/09/24 157 lb 12.8 oz (71.6 kg)  12/22/23 156 lb 4.8 oz (70.9 kg)      Physical Exam Vitals and nursing note reviewed.   Constitutional:      General: She is not in acute distress.    Appearance: Normal appearance. She is well-developed. She is not ill-appearing or toxic-appearing.  HENT:     Head: Normocephalic.     Right Ear: Hearing, tympanic membrane, ear canal and external ear normal.     Left Ear: Hearing, tympanic membrane, ear canal and external ear normal.     Nose: Nose normal.  Eyes:     General: Lids are normal. Lids are everted, no foreign bodies appreciated.     Conjunctiva/sclera: Conjunctivae normal.     Pupils: Pupils are equal, round, and reactive to light.  Neck:     Thyroid : No thyroid  mass or thyromegaly.     Vascular: No carotid bruit.     Trachea: Trachea normal.  Cardiovascular:     Rate and Rhythm: Normal rate and regular rhythm.     Heart sounds: Normal heart sounds, S1 normal and S2 normal. No murmur heard.    No gallop.  Pulmonary:     Effort: Pulmonary effort is normal. No respiratory distress.     Breath sounds: Normal breath sounds. No wheezing, rhonchi or rales.  Chest:  Breasts:    Breasts are symmetrical.     Right: Normal. No swelling, bleeding, inverted nipple, mass, nipple discharge, skin change or tenderness.     Left: Normal. No swelling, bleeding, inverted nipple, mass, nipple discharge, skin change or tenderness.  Abdominal:     General: Bowel sounds are normal. There is no distension or abdominal bruit.     Palpations: Abdomen is soft. There is no fluid wave or mass.     Tenderness: There is no abdominal tenderness. There is no guarding or rebound.     Hernia: No hernia is present.  Musculoskeletal:     Cervical back: Normal range of motion and neck supple.  Lymphadenopathy:     Cervical: No cervical adenopathy.  Skin:    General: Skin is warm and dry.     Findings: No rash.  Neurological:     Mental Status: She is alert.     Cranial Nerves: No cranial nerve deficit.     Sensory: No sensory deficit.  Psychiatric:        Mood and Affect: Mood is  not anxious or depressed.        Speech: Speech normal.        Behavior: Behavior normal. Behavior is cooperative.  Judgment: Judgment normal.       Results for orders placed or performed in visit on 05/05/24  Hemoglobin A1c   Collection Time: 05/05/24  7:46 AM  Result Value Ref Range   Hgb A1c MFr Bld 6.2 4.6 - 6.5 %  VITAMIN D  25 Hydroxy (Vit-D Deficiency, Fractures)   Collection Time: 05/05/24  7:46 AM  Result Value Ref Range   VITD 34.80 30.00 - 100.00 ng/mL  CBC with Differential/Platelet   Collection Time: 05/05/24  7:46 AM  Result Value Ref Range   WBC 5.8 4.0 - 10.5 K/uL   RBC 4.43 3.87 - 5.11 Mil/uL   Hemoglobin 14.1 12.0 - 15.0 g/dL   HCT 57.9 63.9 - 53.9 %   MCV 94.7 78.0 - 100.0 fl   MCHC 33.5 30.0 - 36.0 g/dL   RDW 85.4 88.4 - 84.4 %   Platelets 262.0 150.0 - 400.0 K/uL   Neutrophils Relative % 36.4 (L) 43.0 - 77.0 %   Lymphocytes Relative 56.5 Repeated and verified X2. (H) 12.0 - 46.0 %   Monocytes Relative 4.7 3.0 - 12.0 %   Eosinophils Relative 1.7 0.0 - 5.0 %   Basophils Relative 0.7 0.0 - 3.0 %   Neutro Abs 2.1 1.4 - 7.7 K/uL   Lymphs Abs 3.3 0.7 - 4.0 K/uL   Monocytes Absolute 0.3 0.1 - 1.0 K/uL   Eosinophils Absolute 0.1 0.0 - 0.7 K/uL   Basophils Absolute 0.0 0.0 - 0.1 K/uL  Lipid panel   Collection Time: 05/05/24  7:46 AM  Result Value Ref Range   Cholesterol 174 28 - 200 mg/dL   Triglycerides 884.9 89.9 - 149.0 mg/dL   HDL 36.49 >60.99 mg/dL   VLDL 76.9 0.0 - 59.9 mg/dL   LDL Cholesterol 88 10 - 99 mg/dL   Total CHOL/HDL Ratio 3    NonHDL 110.81   Comprehensive metabolic panel   Collection Time: 05/05/24  7:46 AM  Result Value Ref Range   Sodium 140 135 - 145 mEq/L   Potassium 5.1 3.5 - 5.1 mEq/L   Chloride 105 96 - 112 mEq/L   CO2 27 19 - 32 mEq/L   Glucose, Bld 114 (H) 70 - 99 mg/dL   BUN 17 6 - 23 mg/dL   Creatinine, Ser 8.95 0.40 - 1.20 mg/dL   Total Bilirubin 0.5 0.2 - 1.2 mg/dL   Alkaline Phosphatase 27 (L) 39 - 117 U/L    AST 29 5 - 37 U/L   ALT 30 3 - 35 U/L   Total Protein 6.0 6.0 - 8.3 g/dL   Albumin 4.1 3.5 - 5.2 g/dL   GFR 46.58 (L) >39.99 mL/min   Calcium 8.6 8.4 - 10.5 mg/dL     COVID 19 screen:  No recent travel or known exposure to COVID19 The patient denies respiratory symptoms of COVID 19 at this time. The importance of social distancing was discussed today.   Assessment and Plan   The patient's preventative maintenance and recommended screening tests for an annual wellness exam were reviewed in full today. Brought up to date unless services declined.  Counselled on the importance of diet, exercise, and its role in overall health and mortality. The patient's FH and SH was reviewed, including their home life, tobacco status, and drug and alcohol status.   Colonoscopy every 3 years. 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  Vaccines:uptodate, flu done, S/P COVID19 x3 and PNA series. S/P shingirx,   Flu ,  consider RSV and Td Hep C: done  Mammogram: Chek 2 mutation. Annual mammogram with breast MRI Needs breast exam twice a year..   Bone density: 12/2023 normal, repeat in 5 years.  Problem List Items Addressed This Visit     Ductal carcinoma in situ (DCIS) of right breast   Active treatment, followed by Dr. Odean oncology.  On tamoxifen  10 mg daily for  5 years. Discussed healthy lifestyle to mitigate side effects of this medication.      Dystrophy, vulva/ vulvadynia   Chronic, with acute flare Had flare up of typical symptoms of vulvadynia. Amitriptyline   30 mg at bedtimeused for this...  given flare interested in  increasing dose.  Amitriptyline  50 mg po qHS      GAD (generalized anxiety disorder)   Stable, chronic.  Continue current medication.  well controlled on sertraline  25 mg daily.      Relevant Medications   amitriptyline  (ELAVIL ) 50 MG tablet   Prediabetes - Primary   Encouraged exercise, weight loss, healthy eating habits.           Greig Ring, MD   "

## 2024-05-12 NOTE — Assessment & Plan Note (Signed)
 Active treatment, followed by Dr. Odean oncology.  On tamoxifen  10 mg daily for  5 years. Discussed healthy lifestyle to mitigate side effects of this medication.

## 2024-12-21 ENCOUNTER — Ambulatory Visit: Admitting: Hematology and Oncology

## 2025-03-14 ENCOUNTER — Ambulatory Visit

## 2025-05-08 ENCOUNTER — Other Ambulatory Visit

## 2025-05-15 ENCOUNTER — Encounter: Admitting: Family Medicine
